# Patient Record
Sex: Male | Born: 1950 | Race: White | Hispanic: No | Marital: Married | Smoking: Never smoker
Health system: Southern US, Community
[De-identification: ages and names within clinical notes are randomized; demographics above are authoritative.]

## PROBLEM LIST (undated history)

## (undated) DIAGNOSIS — R51 Headache: Secondary | ICD-10-CM

## (undated) DIAGNOSIS — J45909 Unspecified asthma, uncomplicated: Secondary | ICD-10-CM

## (undated) DIAGNOSIS — F329 Major depressive disorder, single episode, unspecified: Secondary | ICD-10-CM

## (undated) DIAGNOSIS — S83207A Unspecified tear of unspecified meniscus, current injury, left knee, initial encounter: Secondary | ICD-10-CM

## (undated) DIAGNOSIS — E7800 Pure hypercholesterolemia, unspecified: Secondary | ICD-10-CM

## (undated) DIAGNOSIS — M1710 Unilateral primary osteoarthritis, unspecified knee: Secondary | ICD-10-CM

## (undated) DIAGNOSIS — R519 Headache, unspecified: Secondary | ICD-10-CM

## (undated) DIAGNOSIS — Z8639 Personal history of other endocrine, nutritional and metabolic disease: Secondary | ICD-10-CM

## (undated) DIAGNOSIS — J189 Pneumonia, unspecified organism: Secondary | ICD-10-CM

## (undated) DIAGNOSIS — F32A Depression, unspecified: Secondary | ICD-10-CM

## (undated) DIAGNOSIS — M179 Osteoarthritis of knee, unspecified: Secondary | ICD-10-CM

## (undated) DIAGNOSIS — B2790 Infectious mononucleosis, unspecified without complication: Secondary | ICD-10-CM

## (undated) DIAGNOSIS — H35033 Hypertensive retinopathy, bilateral: Principal | ICD-10-CM

## (undated) HISTORY — DX: Unspecified asthma, uncomplicated: J45.909

## (undated) HISTORY — DX: Unspecified tear of unspecified meniscus, current injury, left knee, initial encounter: S83.207A

## (undated) HISTORY — PX: COLONOSCOPY W/ BIOPSIES AND POLYPECTOMY: SHX1376

## (undated) HISTORY — DX: Pneumonia, unspecified organism: J18.9

## (undated) HISTORY — DX: Personal history of other endocrine, nutritional and metabolic disease: Z86.39

## (undated) HISTORY — DX: Hypertensive retinopathy, bilateral: H35.033

## (undated) HISTORY — PX: EYE SURGERY: SHX253

## (undated) HISTORY — PX: TONSILLECTOMY: SUR1361

## (undated) HISTORY — PX: KNEE ARTHROSCOPY: SUR90

## (undated) HISTORY — PX: WISDOM TOOTH EXTRACTION: SHX21

---

## 2012-08-07 ENCOUNTER — Encounter: Payer: Self-pay | Admitting: Family Medicine

## 2012-08-07 ENCOUNTER — Ambulatory Visit (INDEPENDENT_AMBULATORY_CARE_PROVIDER_SITE_OTHER): Payer: Managed Care, Other (non HMO) | Admitting: Family Medicine

## 2012-08-07 VITALS — BP 133/70 | HR 65 | Ht 71.0 in | Wt 203.0 lb

## 2012-08-07 DIAGNOSIS — J45909 Unspecified asthma, uncomplicated: Secondary | ICD-10-CM

## 2012-08-07 DIAGNOSIS — Z13220 Encounter for screening for lipoid disorders: Secondary | ICD-10-CM

## 2012-08-07 DIAGNOSIS — S83207A Unspecified tear of unspecified meniscus, current injury, left knee, initial encounter: Secondary | ICD-10-CM | POA: Insufficient documentation

## 2012-08-07 DIAGNOSIS — Z131 Encounter for screening for diabetes mellitus: Secondary | ICD-10-CM

## 2012-08-07 DIAGNOSIS — J189 Pneumonia, unspecified organism: Secondary | ICD-10-CM

## 2012-08-07 DIAGNOSIS — Z8639 Personal history of other endocrine, nutritional and metabolic disease: Secondary | ICD-10-CM

## 2012-08-07 HISTORY — DX: Personal history of other endocrine, nutritional and metabolic disease: Z86.39

## 2012-08-07 HISTORY — DX: Unspecified asthma, uncomplicated: J45.909

## 2012-08-07 HISTORY — DX: Pneumonia, unspecified organism: J18.9

## 2012-08-07 HISTORY — DX: Unspecified tear of unspecified meniscus, current injury, left knee, initial encounter: S83.207A

## 2012-08-07 NOTE — Progress Notes (Addendum)
CC: Fred Bond is a 62 y.o. male is here for Establish Care  Colon 2010 5 years clearance  Subjective: HPI: Pronounced "Weedman". 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 He has no acute complaints.  History of iron deficiency anemia: This was found about 2 years ago by his former provider, he's unsure of specifics regarding iron profile or hemoglobin. Just prior to his being found he had a colonoscopy which did not show any polyps or abnormalities. He takes a Flintstones multivitamin for iron no other supplementation. He denies shortness of breath, irregular heartbeat, palpitations, nor fatigue.  Is a history of 2 pneumonias one year part one of which required hospitalization, the other which he declined hospitalization due to being [place] however [place] an [place].  History of asthma: Not requiring albuterol for the past year plus.  History of left knee meniscal tear for which he is trying to postpone surgery on. It often catches, and prohibits him from running which bothers him quite a deal.  Review of Systems - General ROS: negative for - chills, fever, night sweats, weight gain or weight loss Ophthalmic ROS: negative for - decreased vision Psychological ROS: negative for - anxiety or depression ENT ROS: negative for - hearing change, nasal congestion, tinnitus or allergies Hematological and Lymphatic ROS: negative for - bleeding problems, bruising or swollen lymph nodes Breast ROS: negative Respiratory ROS: no cough, shortness of breath, or wheezing Cardiovascular ROS: no chest pain or dyspnea on exertion Gastrointestinal ROS: no abdominal pain, change in bowel habits, or black or bloody stools Genito-Urinary ROS: negative for - genital discharge, genital ulcers, incontinence or abnormal bleeding from genitals Musculoskeletal ROS: negative for - joint pain  or muscle pain other than above Neurological ROS: negative for - headaches or memory loss Dermatological ROS: negative for lumps, mole changes, rash and skin lesion changes  Past Medical History  Diagnosis Date  . Asthma 08/07/2012    Childhood   . History of iron deficiency 08/07/2012  . Recurrent pneumonia 08/07/2012  . Tear of meniscus of left knee 08/07/2012     Family History  Problem Relation Age of Onset  . Alcohol abuse      grandfather  . Heart attack      grandparents     History  Substance Use Topics  . Smoking status: Never Smoker   . Smokeless tobacco: Not on file  . Alcohol Use: No     Objective: Filed Vitals:   08/07/12 1002  BP: 133/70  Pulse: 65    General: Alert and Oriented, No Acute Distress HEENT: Pupils equal, round, reactive to light. Conjunctivae clear.  External ears unremarkable, canals clear with intact TMs with appropriate landmarks.  Middle ear appears open without effusion. Pink inferior turbinates.  Moist mucous membranes, pharynx without inflammation nor lesions.  Neck supple without palpable lymphadenopathy nor abnormal masses. Lungs: Clear to auscultation bilaterally, no wheezing/ronchi/rales.  Comfortable work of breathing. Good air movement. Cardiac: Regular rate and rhythm. Normal S1/S2.  No murmurs, rubs, nor gallops.  No carotid bruits Mental Status: No depression, anxiety, nor agitation. Skin: Warm and dry.  Assessment & Plan: Fred Bond was seen today for establish care.  Diagnoses and associated orders for this visit:  History of iron deficiency - CBC  Recurrent pneumonia  Lipid screening - Lipid panel  Screening for diabetes mellitus - COMPLETE METABOLIC PANEL WITH GFR  Asthma Comments: Childhood  History of iron deficiency: Clinically stable: We'll screen with CBC  Recurrent pneumonia in the setting of asthma: Stable asthma however will strongly consider pneumonia vaccine at his upcoming CPE Lipid screening: Due for  annual screening He'll return at his convenience for a CPE  30 minutes spent face-to-face during visit today of which at least 50% was counseling or coordinating care regarding iron deficiency anemia: Asthma: Pneumonia: Meniscal tear.     Return in about 1 week (around 08/14/2012).

## 2012-08-08 LAB — LIPID PANEL
LDL Cholesterol: 148 mg/dL — ABNORMAL HIGH (ref 0–99)
Triglycerides: 128 mg/dL (ref <150)
VLDL: 26 mg/dL (ref 0–40)

## 2012-08-08 LAB — CBC
HCT: 43.8 % (ref 39.0–52.0)
Hemoglobin: 15.1 g/dL (ref 13.0–17.0)
MCHC: 34.5 g/dL (ref 30.0–36.0)
MCV: 93 fL (ref 78.0–100.0)

## 2012-08-09 LAB — COMPLETE METABOLIC PANEL WITH GFR
ALT: 44 U/L (ref 0–53)
AST: 34 U/L (ref 0–37)
Calcium: 9.4 mg/dL (ref 8.4–10.5)
Chloride: 104 mEq/L (ref 96–112)
Creat: 0.95 mg/dL (ref 0.50–1.35)
Sodium: 140 mEq/L (ref 135–145)
Total Bilirubin: 0.6 mg/dL (ref 0.3–1.2)

## 2012-08-11 ENCOUNTER — Encounter: Payer: Self-pay | Admitting: Family Medicine

## 2012-08-11 DIAGNOSIS — E785 Hyperlipidemia, unspecified: Secondary | ICD-10-CM | POA: Insufficient documentation

## 2012-08-20 ENCOUNTER — Encounter: Payer: Self-pay | Admitting: *Deleted

## 2012-08-25 ENCOUNTER — Ambulatory Visit (INDEPENDENT_AMBULATORY_CARE_PROVIDER_SITE_OTHER): Payer: Managed Care, Other (non HMO) | Admitting: Family Medicine

## 2012-08-25 ENCOUNTER — Encounter: Payer: Self-pay | Admitting: Family Medicine

## 2012-08-25 VITALS — BP 124/72 | HR 74 | Ht 71.0 in | Wt 197.0 lb

## 2012-08-25 DIAGNOSIS — Z125 Encounter for screening for malignant neoplasm of prostate: Secondary | ICD-10-CM

## 2012-08-25 DIAGNOSIS — R6882 Decreased libido: Secondary | ICD-10-CM

## 2012-08-25 DIAGNOSIS — Z0000 Encounter for general adult medical examination without abnormal findings: Secondary | ICD-10-CM

## 2012-08-25 MED ORDER — ZOSTER VACCINE LIVE 19400 UNT/0.65ML ~~LOC~~ SOLR: 0.6500 mL | Freq: Once | SUBCUTANEOUS | Status: DC

## 2012-08-25 NOTE — Progress Notes (Signed)
CC: Fred Bond is a 61 y.o. male is here for Annual Exam  Colonoscopy:2010 repeat due 2015 Prostate: Discussed screening risks/beneifts with patient on 08/25/2012. He would like a PSA done   Influenza Vaccine: Up-to-date for this season Pneumovax: Not indicated Td/Tdap: Received sometime in 2005, up-to-date Zoster: consider filling Rx given today at age 76  Subjective: HPI:  Over the past 2 weeks have you been bothered by: - Little interest or pleasure in doing things: No - Feeling down depressed or hopeless: No  Patient's only acute complaint is decreased libido. Has been present for months. Is in a loving relationship with his wife, still physically attracted to her. Denies trouble initiating or maintaining an erection. Denies fatigue or depression.   Review of Systems - General ROS: negative for - chills, fever, night sweats, weight gain or weight loss Ophthalmic ROS: negative for - decreased vision Psychological ROS: negative for - anxiety or depression ENT ROS: negative for - hearing change, nasal congestion, tinnitus or allergies Hematological and Lymphatic ROS: negative for - bleeding problems, bruising or swollen lymph nodes Breast ROS: negative Respiratory ROS: no cough, shortness of breath, or wheezing Cardiovascular ROS: no chest pain or dyspnea on exertion Gastrointestinal ROS: no abdominal pain, change in bowel habits, or black or bloody stools Genito-Urinary ROS: negative for - genital discharge, genital ulcers, incontinence or abnormal bleeding from genitals Musculoskeletal ROS: negative for - joint pain or muscle pain Neurological ROS: negative for - headaches or memory loss Dermatological ROS: negative for lumps, mole changes, rash and skin lesion changes  Past Medical History  Diagnosis Date  . Asthma 08/07/2012    Childhood   . History of iron deficiency 08/07/2012  . Recurrent pneumonia 08/07/2012  . Tear of meniscus of left knee 08/07/2012     Family  History  Problem Relation Age of Onset  . Alcohol abuse      grandfather  . Heart attack      grandparents     History  Substance Use Topics  . Smoking status: Never Smoker   . Smokeless tobacco: Not on file  . Alcohol Use: No     Objective: Filed Vitals:   08/25/12 1335  BP: 124/72  Pulse: 74   General: No Acute Distress HEENT: Atraumatic, normocephalic, conjunctivae normal without scleral icterus.  No nasal discharge, hearing grossly intact, TMs with good landmarks bilaterally with no middle ear abnormalities, posterior pharynx clear without oral lesions. Neck: Supple, trachea midline, no cervical nor supraclavicular adenopathy. Pulmonary: Clear to auscultation bilaterally without wheezing, rhonchi, nor rales. Cardiac: Regular rate and rhythm.  No murmurs, rubs, nor gallops. No peripheral edema.  2+ peripheral pulses bilaterally. Abdomen: Bowel sounds normal.  No masses.  Non-tender without rebound.  Negative Murphy's sign. GU: Declined by patient MSK: Grossly intact, no signs of weakness.  Full strength throughout upper and lower extremities.  Full ROM in upper and lower extremities.  No midline spinal tenderness. Neuro: Gait unremarkable, CN II-XII grossly intact.  C5-C6 Reflex 2/4 Bilaterally, L4 Reflex 2/4 Bilaterally.  Cerebellar function intact. Skin: No rashes. Psych: Alert and oriented to person/place/time.  Thought process normal. No anxiety/depression.  Assessment & Plan: Fred Bond was seen today for annual exam.  Diagnoses and associated orders for this visit:  Annual physical exam  Prostate cancer screening - PSA  Libido, decreased - Testosterone, free, total  Other Orders - zoster vaccine live, PF, (ZOSTAVAX) 84132 UNT/0.65ML injection; Inject 19,400 Units into the skin once.    Health maintenance topics  and age-appropriate cancer prevention methods were updated about history of present illness. We'll screen for symptomatic low testosterone and  discussed repeating for confirmation if low. He is up-to-date on his lab work, we discussed healthy diet and lifestyle interventions to promote general health and well-being and to help lower his LDL cholesterol, he would like to avoid cholesterol medication if possible. Asked him to return in 3 months for a cholesterol panel  Return if symptoms worsen or fail to improve.

## 2012-08-27 LAB — TESTOSTERONE, FREE, TOTAL, SHBG
Sex Hormone Binding: 110 nmol/L — ABNORMAL HIGH (ref 13–71)
Testosterone-% Free: 0.9 % — ABNORMAL LOW (ref 1.6–2.9)
Testosterone: 888.14 ng/dL (ref 300–890)

## 2012-09-22 ENCOUNTER — Encounter: Payer: Self-pay | Admitting: Family Medicine

## 2012-09-22 DIAGNOSIS — Z8639 Personal history of other endocrine, nutritional and metabolic disease: Secondary | ICD-10-CM

## 2012-09-26 ENCOUNTER — Encounter: Payer: Self-pay | Admitting: *Deleted

## 2012-09-26 LAB — IRON: .: 84

## 2012-09-26 LAB — UIBC: .: 395

## 2012-09-26 LAB — IRON AND TIBC: .: 395

## 2013-08-23 ENCOUNTER — Emergency Department (INDEPENDENT_AMBULATORY_CARE_PROVIDER_SITE_OTHER): Payer: Managed Care, Other (non HMO)

## 2013-08-23 ENCOUNTER — Emergency Department
Admission: EM | Admit: 2013-08-23 | Discharge: 2013-08-23 | Disposition: A | Discharge status: Home / Self Care | Payer: Managed Care, Other (non HMO) | Source: Home / Self Care | Attending: Family Medicine | Admitting: Family Medicine

## 2013-08-23 ENCOUNTER — Encounter: Payer: Self-pay | Admitting: Emergency Medicine

## 2013-08-23 DIAGNOSIS — IMO0001 Reserved for inherently not codable concepts without codable children: Secondary | ICD-10-CM

## 2013-08-23 DIAGNOSIS — M20019 Mallet finger of unspecified finger(s): Secondary | ICD-10-CM

## 2013-08-23 DIAGNOSIS — M19049 Primary osteoarthritis, unspecified hand: Secondary | ICD-10-CM

## 2013-08-23 NOTE — ED Notes (Signed)
Zeek jammed his 3 rd finger on his left hand today. The pain is an aching/throbbing pain that is a 4/10.

## 2013-08-23 NOTE — ED Provider Notes (Signed)
CSN: [id number]     Arrival date & time 08/23/13  1210 History   First MD Initiated Contact with Patient 08/23/13 1213     Chief Complaint  Patient presents with  . Finger Injury    3 rd digit, today    HPI  L middle finger pain x 2 days Pt accidentally jammed finger into bed yesterday  Has had mild pain and inability to extend DIP joint of middle finger since this point.  No distal numbness or paresthesias.  No swelling.  Minimal redness.   Past Medical History  Diagnosis Date  . Asthma 08/07/2012    Childhood   . History of iron deficiency 08/07/2012  . Recurrent pneumonia 08/07/2012  . Tear of meniscus of left knee 08/07/2012   History reviewed. No pertinent past surgical history. Family History  Problem Relation Age of Onset  . Alcohol abuse      grandfather  . Heart attack      grandparents   History  Substance Use Topics  . Smoking status: Never Smoker   . Smokeless tobacco: Not on file  . Alcohol Use: No    Review of Systems  All other systems reviewed and are negative.    Allergies  Review of patient's allergies indicates no known allergies.  Home Medications   Current Outpatient Rx  Name  Route  Sig  Dispense  Refill  . aspirin 325 MG tablet   Oral   Take 325 mg by mouth daily.         . calcium carbonate (OS-CAL) 600 MG TABS   Oral   Take 600 mg by mouth daily.         Marland Kitchen L-Lysine 500 MG TABS   Oral   Take by mouth.         . Multiple Vitamins-Minerals (CENTRUM SILVER ULTRA MENS) TABS   Oral   Take by mouth.         . niacin 500 MG tablet   Oral   Take 500 mg by mouth daily with breakfast.         . Omega-3 Fatty Acids (FISH OIL) 1000 MG CAPS   Oral   Take by mouth.         . zoster vaccine live, PF, (ZOSTAVAX) 40981 UNT/0.65ML injection   Subcutaneous   Inject 19,400 Units into the skin once.   1 each   0    BP 114/74  Pulse 69  Temp(Src) 97.8 F (36.6 C) (Oral)  Ht 5\' 11"  (1.803 m)  Wt 201 lb (91.173 kg)  BMI  28.05 kg/m2  SpO2 95% Physical Exam  Constitutional: He is oriented to person, place, and time. He appears well-developed and well-nourished.  HENT:  Head: Normocephalic and atraumatic.  Eyes: Conjunctivae are normal. Pupils are equal, round, and reactive to light.  Neck: Normal range of motion. Neck supple.  Cardiovascular: Normal rate and regular rhythm.   Pulmonary/Chest: Effort normal and breath sounds normal.  Abdominal: Soft.  Musculoskeletal: Normal range of motion.       Hands: Neurological: He is alert and oriented to person, place, and time.  Skin: Skin is warm.  Affected finger neurovascularly intact.   ED Course  Procedures (including critical care time) Labs Review Labs Reviewed - No data to display Imaging Review Dg Hand Complete Left  08/23/2013   CLINICAL DATA:  Left hand injury. Jammed middle finger. Pain and swelling.  EXAM: LEFT HAND - COMPLETE 3+ VIEW  COMPARISON:  None.  FINDINGS: There is no evidence of fracture or dislocation. Severe osteoarthritis is seen involving the base of the thumb. No other significant bone abnormality identified. Soft tissues are unremarkable.  IMPRESSION: No acute findings.  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   Electronically Signed   By: Myles RosenthalJohn  Stahl M.D.   On: 08/23/2013 13:37    EKG Interpretation    Date/Time:    Ventricular Rate:    PR Interval:    QRS Duration:   QT Interval:    QTC Calculation:   R Axis:     Text Interpretation:              MDM   1. Mallet deformity of third finger of left hand    Will place in extensor splint.  xrays negative for acute fracture Will follow up with sports medicine within the next week.  Discussed general and MSK red flags  Follow up as needed.        Doree AlbeeSteven Newton, MD 08/23/13 1341

## 2013-08-24 ENCOUNTER — Ambulatory Visit (INDEPENDENT_AMBULATORY_CARE_PROVIDER_SITE_OTHER): Payer: Managed Care, Other (non HMO) | Admitting: Sports Medicine

## 2013-08-24 ENCOUNTER — Encounter: Payer: Self-pay | Admitting: Sports Medicine

## 2013-08-24 VITALS — BP 109/63 | HR 76 | Wt 199.0 lb

## 2013-08-24 DIAGNOSIS — M20019 Mallet finger of unspecified finger(s): Secondary | ICD-10-CM

## 2013-08-24 DIAGNOSIS — IMO0001 Reserved for inherently not codable concepts without codable children: Secondary | ICD-10-CM | POA: Insufficient documentation

## 2013-08-24 NOTE — Progress Notes (Addendum)
   Subjective:    I'm seeing this patient as a consultation for:  Dr. Alvester MorinNewton  CC: Finger injury  HPI: This is a very pleasant 63 year old male, yesterday he was trying to make his bed, when he jammed his left third distal interphalangeal joint. He had immediate pain and inability to extend at the PIP. He was placed in an extension splint and referred to me for further evaluation and definitive treatment. Symptoms are moderate, persistent.  Past medical history, Surgical history, Family history not pertinant except as noted below, Social history, Allergies, and medications have been entered into the medical record, reviewed, and no changes needed.   Review of Systems: No headache, visual changes, nausea, vomiting, diarrhea, constipation, dizziness, abdominal pain, skin rash, fevers, chills, night sweats, weight loss, swollen lymph nodes, body aches, joint swelling, muscle aches, chest pain, shortness of breath, mood changes, visual or auditory hallucinations.   Objective:   General: Well Developed, well nourished, and in no acute distress.  Neuro/Psych: Alert and oriented x3, extra-ocular muscles intact, able to move all 4 extremities, sensation grossly intact. Skin: Warm and dry, no rashes noted.  Respiratory: Not using accessory muscles, speaking in full sentences, trachea midline.  Cardiovascular: Pulses palpable, no extremity edema. Abdomen: Does not appear distended. Left hand: No swelling, no bruising, distal interphalangeal joint is held in a flexed position with 0/5 strength to extension.  X-rays were reviewed and are negative for any type of fracture.  The stack splint was placed keeping the DIP joint in extension.  Impression and Recommendations:   This case required medical decision making of moderate complexity.

## 2013-08-24 NOTE — Assessment & Plan Note (Signed)
This represents a tendinous injury with normal x-rays. Stack splint placed, I do anticipate a total weeks of immobilization. I would like to see him back in about 4 weeks. Anatomy and natural history of this injury was discussed.

## 2013-08-27 ENCOUNTER — Institutional Professional Consult (permissible substitution): Payer: Managed Care, Other (non HMO) | Admitting: Sports Medicine

## 2013-09-07 ENCOUNTER — Ambulatory Visit (INDEPENDENT_AMBULATORY_CARE_PROVIDER_SITE_OTHER): Payer: PRIVATE HEALTH INSURANCE | Admitting: Sports Medicine

## 2013-09-07 ENCOUNTER — Encounter: Payer: Self-pay | Admitting: Sports Medicine

## 2013-09-07 VITALS — BP 129/69 | HR 79 | Ht 71.0 in | Wt 199.0 lb

## 2013-09-07 DIAGNOSIS — M20019 Mallet finger of unspecified finger(s): Secondary | ICD-10-CM

## 2013-09-07 DIAGNOSIS — IMO0001 Reserved for inherently not codable concepts without codable children: Secondary | ICD-10-CM

## 2013-09-07 MED ORDER — MUPIROCIN CALCIUM 2 % NA OINT: 1.0000 "application " | TOPICAL_OINTMENT | Freq: Two times a day (BID) | NASAL | Status: DC

## 2013-09-07 NOTE — Progress Notes (Signed)
  Subjective:    CC: Followup  HPI: [name] returns for me to look at his left third mallet finger. He was doing well until recently, he developed some swelling, pain, and a foul order. He feels as though his stack splint is now too tight. Symptoms are moderate, persistent.  Past medical history, Surgical history, Family history not pertinant except as noted below, Social history, Allergies, and medications have been entered into the medical record, reviewed, and no changes needed.   Review of Systems: No fevers, chills, night sweats, weight loss, chest pain, or shortness of breath.   Objective:    General: Well Developed, well nourished, and in no acute distress.  Neuro: Alert and oriented x3, extra-ocular muscles intact, sensation grossly intact.  HEENT: Normocephalic, atraumatic, pupils equal round reactive to light, neck supple, no masses, no lymphadenopathy, thyroid nonpalpable.  Skin: Warm and dry, no rashes. Cardiac: Regular rate and rhythm, no murmurs rubs or gallops, no lower extremity edema.  Respiratory: Clear to auscultation bilaterally. Not using accessory muscles, speaking in full sentences. Left hand: Splint is removed taking care to keep the distal interphalangeal joint and extension. He does have some skin breakdown and some foul order but no overt cellulitis.  Volar extension splint was placed.  Impression and Recommendations:

## 2013-09-07 NOTE — Assessment & Plan Note (Addendum)
There is a mild superficial skin infection as well as some skin breakdown from the stack splint being too tight. I have placed a simple volar extension splint. We are going to topical mupirocin. Return to see me in 4 weeks. I have given him a size 5.5 stack splint for use once his skin has healed.

## 2013-09-08 ENCOUNTER — Telehealth: Payer: Self-pay | Admitting: *Deleted

## 2013-09-08 ENCOUNTER — Telehealth: Payer: Self-pay

## 2013-09-08 MED ORDER — MUPIROCIN 2 % EX OINT: TOPICAL_OINTMENT | CUTANEOUS | Status: DC

## 2013-09-08 MED ORDER — OSELTAMIVIR PHOSPHATE 75 MG PO CAPS: ORAL_CAPSULE | ORAL | Status: DC

## 2013-09-08 NOTE — Telephone Encounter (Signed)
Patient called concerned about a Rx Bactroban that the pharmacy has on hold. I called [facility] and they request a new Rx with the correct instructions on it. The directions state to put in the nostrils but however it is for the patient  finger. Please send to [facility]. Fred Bond,CMA

## 2013-09-08 NOTE — Telephone Encounter (Signed)
Patient has been notified. Rhonda Cunningham,CMA  

## 2013-09-08 NOTE — Telephone Encounter (Signed)
Pt called and says his step son was dx with flu. Pt would like to get a rx for tamiflu. rx sent and pt notified

## 2013-09-08 NOTE — Telephone Encounter (Signed)
Yes should say finger!  Will resend.

## 2013-09-21 ENCOUNTER — Ambulatory Visit: Payer: Managed Care, Other (non HMO) | Admitting: Sports Medicine

## 2013-10-05 ENCOUNTER — Ambulatory Visit (INDEPENDENT_AMBULATORY_CARE_PROVIDER_SITE_OTHER): Payer: PRIVATE HEALTH INSURANCE | Admitting: Sports Medicine

## 2013-10-05 ENCOUNTER — Encounter: Payer: Self-pay | Admitting: Sports Medicine

## 2013-10-05 VITALS — BP 134/82 | HR 70 | Ht 71.0 in | Wt 200.0 lb

## 2013-10-05 DIAGNOSIS — M20019 Mallet finger of unspecified finger(s): Secondary | ICD-10-CM

## 2013-10-05 DIAGNOSIS — IMO0001 Reserved for inherently not codable concepts without codable children: Secondary | ICD-10-CM

## 2013-10-05 NOTE — Progress Notes (Signed)
  Subjective:    CC: Recheck mallet finger  HPI: This is a pleasant 63 year old male, he returns after 6 weeks of immobilization of the distal interphalangeal joint of his third left digit. He had a extensor tendon avulsion without bony injury. He is a fantastic job keeping the digit extending, and has not allowed to drop and a flexion since beginning. He really has no pain at this point.  Past medical history, Surgical history, Family history not pertinant except as noted below, Social history, Allergies, and medications have been entered into the medical record, reviewed, and no changes needed.   Review of Systems: No fevers, chills, night sweats, weight loss, chest pain, or shortness of breath.   Objective:    General: Well Developed, well nourished, and in no acute distress.  Neuro: Alert and oriented x3, extra-ocular muscles intact, sensation grossly intact.  HEENT: Normocephalic, atraumatic, pupils equal round reactive to light, neck supple, no masses, no lymphadenopathy, thyroid nonpalpable.  Skin: Warm and dry, no rashes. Cardiac: Regular rate and rhythm, no murmurs rubs or gallops, no lower extremity edema.  Respiratory: Clear to auscultation bilaterally. Not using accessory muscles, speaking in full sentences. Left hand: Extension splint is removed, he is able to maintain full active extension against resistance, he only lacks approximately 1-2 of terminal extension. Flexion at the distal and proximal interphalangeal joints are of course limited at this point due to prolonged immobilization.  Impression and Recommendations:

## 2013-10-05 NOTE — Assessment & Plan Note (Signed)
Doing extremely well with 6 weeks of extension immobilization. He does lack approximately 1-2 of extension however he does have active strength to extension at the distal interphalangeal joint. Work on regaining range of motion, return to see me in one month.

## 2013-10-20 ENCOUNTER — Encounter: Payer: Self-pay | Admitting: Family Medicine

## 2013-10-20 ENCOUNTER — Ambulatory Visit (INDEPENDENT_AMBULATORY_CARE_PROVIDER_SITE_OTHER): Payer: PRIVATE HEALTH INSURANCE | Admitting: Family Medicine

## 2013-10-20 VITALS — BP 130/67 | HR 68 | Wt 200.0 lb

## 2013-10-20 DIAGNOSIS — S060X0A Concussion without loss of consciousness, initial encounter: Secondary | ICD-10-CM

## 2013-10-20 NOTE — Progress Notes (Signed)
CC: Fred Bond is a 63 y.o. male is here for Head Injury   Subjective: HPI:  Complains of headache and mental sluggishness but has been present for the last 4 hours came on acutely after he fell off his chair backwards into a wall. Symptoms are mild in severity nothing particularly makes it better or worse no interventions as of yet. They've not been progressing since onset. Have not been accompanied by any other symptoms specifically disorientation, dizziness, nausea, emotional lability, decreased appetite, double vision, nor motor or sensory disturbances other than that described above. He never had this before was in his regular state of health earlier this morning. He is confident that he did not lose consciousness.   Review Of Systems Outlined In HPI  Past Medical History  Diagnosis Date  . Asthma 08/07/2012    Childhood   . History of iron deficiency 08/07/2012  . Recurrent pneumonia 08/07/2012  . Tear of meniscus of left knee 08/07/2012    No past surgical history on file. Family History  Problem Relation Age of Onset  . Alcohol abuse      grandfather  . Heart attack      grandparents    History   Social History  . Marital Status: Married    Spouse Name: N/A    Number of Children: N/A  . Years of Education: N/A   Occupational History  . Not on file.   Social History Main Topics  . Smoking status: Never Smoker   . Smokeless tobacco: Not on file  . Alcohol Use: No  . Drug Use: No  . Sexual Activity: Yes   Other Topics Concern  . Not on file   Social History Narrative  . No narrative on file     Objective: BP 130/67  Pulse 68  Wt 200 lb (90.719 kg)  General: Alert and Oriented, No Acute Distress HEENT: Pupils equal, round, reactive to light. Conjunctivae clear.  External ears unremarkable, canals clear with intact TMs with appropriate landmarks.  Middle ear appears open without effusion. Pink inferior turbinates.  Moist mucous membranes, pharynx without  inflammation nor lesions.  Neck supple without palpable lymphadenopathy nor abnormal masses. Posterior scalp unremarkable Neuro: CN II-XII grossly intact, full strength/rom of all four extremities, C5/L4/S1 DTRs 2/4 bilaterally, gait normal, rapid alternating movements normal, heel-shin test normal, Rhomberg normal. Lungs: Comfortable work of breathing Cardiac: Regular rate and rhythm. Extremities: No peripheral edema.  Strong peripheral pulses.  Mental Status: No depression, anxiety, nor agitation. Skin: Warm and dry.  SCAT 3 testing: Orientation 5 out of 5 Immediate memory of 15 out of 15 Concentration 5 out of 5  delayed recall 5 out of 5  No difficulty standing on nondominant foot single-leg stance with eyes closed  Assessment & Plan: Fred Bond was seen today for head injury.  Diagnoses and associated orders for this visit:  Concussion with no loss of consciousness     discussed with patient that subjective symptoms are convincing for a mild concussion however objective symptoms are reassuring that this should resolve with relative brain rest for the next 2-3 days. Signs and symptoms requring emergent/urgent reevaluation were discussed with the patient.   Return if symptoms worsen or fail to improve.

## 2013-11-02 ENCOUNTER — Ambulatory Visit (INDEPENDENT_AMBULATORY_CARE_PROVIDER_SITE_OTHER): Payer: PRIVATE HEALTH INSURANCE | Admitting: Sports Medicine

## 2013-11-02 ENCOUNTER — Encounter: Payer: Self-pay | Admitting: Sports Medicine

## 2013-11-02 VITALS — BP 138/80 | HR 73 | Ht 71.0 in | Wt 201.0 lb

## 2013-11-02 DIAGNOSIS — IMO0001 Reserved for inherently not codable concepts without codable children: Secondary | ICD-10-CM

## 2013-11-02 DIAGNOSIS — M20019 Mallet finger of unspecified finger(s): Secondary | ICD-10-CM

## 2013-11-02 NOTE — Progress Notes (Signed)
  Subjective:    CC: Recheck mallet finger  HPI: Initially Fred Bond did very well with 6 weeks of extension immobilization, at the end of the 6 weeks he had excellent strength, with only approximately 1-2 extension lag. Unfortunately he has lost extension of the left third finger, and it is now swollen, minimally painful, and he has approximately 45 extension lag, and weakness to extension of the distal interphalangeal joint.  Past medical history, Surgical history, Family history not pertinant except as noted below, Social history, Allergies, and medications have been entered into the medical record, reviewed, and no changes needed.   Review of Systems: No fevers, chills, night sweats, weight loss, chest pain, or shortness of breath.   Objective:    General: Well Developed, well nourished, and in no acute distress.  Neuro: Alert and oriented x3, extra-ocular muscles intact, sensation grossly intact.  HEENT: Normocephalic, atraumatic, pupils equal round reactive to light, neck supple, no masses, no lymphadenopathy, thyroid nonpalpable.  Skin: Warm and dry, no rashes. Cardiac: Regular rate and rhythm, no murmurs rubs or gallops, no lower extremity edema.  Respiratory: Clear to auscultation bilaterally. Not using accessory muscles, speaking in full sentences. Left hand: There is mild erythema, swelling, and 0/5 extension at the third distal interphalangeal joint. Collateral ligaments are stable. Unofficial ultrasound did show the extensor tendon retracted approximately 3 mm from its expected insertion at the distal phalanx.  Extension splint was reapplied.  Impression and Recommendations:

## 2013-11-02 NOTE — Assessment & Plan Note (Signed)
Unfortunately has had a recurrence of flexion deformity after 6 weeks of extension and mobilization. At this point we are going to proceed with referral to hand surgery. Extension splint placed.

## 2013-11-05 ENCOUNTER — Ambulatory Visit (INDEPENDENT_AMBULATORY_CARE_PROVIDER_SITE_OTHER): Payer: PRIVATE HEALTH INSURANCE | Admitting: Family Medicine

## 2013-11-05 ENCOUNTER — Encounter: Payer: Self-pay | Admitting: Family Medicine

## 2013-11-05 VITALS — BP 133/78 | HR 61 | Wt 201.0 lb

## 2013-11-05 DIAGNOSIS — B9689 Other specified bacterial agents as the cause of diseases classified elsewhere: Secondary | ICD-10-CM

## 2013-11-05 DIAGNOSIS — J329 Chronic sinusitis, unspecified: Secondary | ICD-10-CM

## 2013-11-05 DIAGNOSIS — A499 Bacterial infection, unspecified: Secondary | ICD-10-CM

## 2013-11-05 MED ORDER — PREDNISONE 20 MG PO TABS
ORAL_TABLET | ORAL | Status: AC
Start: 2013-11-05 — End: 2013-11-10

## 2013-11-05 MED ORDER — AMOXICILLIN-POT CLAVULANATE 500-125 MG PO TABS: ORAL_TABLET | ORAL | Status: AC

## 2013-11-05 NOTE — Progress Notes (Signed)
CC: Fred Bond is a 63 y.o. male is here for Cough, Generalized Body Aches and Fever   Subjective: HPI:  Complains of nasal congestion with facial pressure beneath both eyes it hasn't present for the last week. Worsening a daily basis originally mild in severity now moderate. Accompanied by fever, chills, productive cough that began 2 days ago. Symptoms are slightly improved with Alka-Seltzer cold products nothing else makes better or worse. Accompanied by body aches starting last night. Denies wheezing, chest pain, shortness of breath, motor sensory disturbances, rashes, sore throat, hearing loss nor wheezing   Review Of Systems Outlined In HPI  Past Medical History  Diagnosis Date  . Asthma 08/07/2012    Childhood   . History of iron deficiency 08/07/2012  . Recurrent pneumonia 08/07/2012  . Tear of meniscus of left knee 08/07/2012    No past surgical history on file. Family History  Problem Relation Age of Onset  . Alcohol abuse      grandfather  . Heart attack      grandparents    History   Social History  . Marital Status: Married    Spouse Name: N/A    Number of Children: N/A  . Years of Education: N/A   Occupational History  . Not on file.   Social History Main Topics  . Smoking status: Never Smoker   . Smokeless tobacco: Not on file  . Alcohol Use: No  . Drug Use: No  . Sexual Activity: Yes   Other Topics Concern  . Not on file   Social History Narrative  . No narrative on file     Objective: BP 133/78  Pulse 61  Wt 201 lb (91.173 kg)  General: Alert and Oriented, No Acute Distress HEENT: Pupils equal, round, reactive to light. Conjunctivae clear.  External ears unremarkable, canals clear with intact TMs with appropriate landmarks.  Middle ear appears open without effusion. Boggy erythematous inferior turbinates with moderate mucoid discharge.  Moist mucous membranes, pharynx without inflammation nor lesions however moderate cobblestoning.  Neck supple  without palpable lymphadenopathy nor abnormal masses. Lungs: Clear to auscultation bilaterally, no wheezing/ronchi/rales.  Comfortable work of breathing. Good air movement. Cardiac: Regular rate and rhythm. Normal S1/S2.  No murmurs, rubs, nor gallops.   Mental Status: No depression, anxiety, nor agitation. Skin: Warm and dry.  Assessment & Plan: Fred Bond was seen today for cough, generalized body aches and fever.  Diagnoses and associated orders for this visit:  Bacterial sinusitis - amoxicillin-clavulanate (AUGMENTIN) 500-125 MG per tablet; Take one by mouth every 8 hours for ten total days. - predniSONE (DELTASONE) 20 MG tablet; Three tabs at once daily for five days.    Bacterial sinusitis: Start Augmentin continue Alka-Seltzer products. Patient inquires whether or not he should start prednisone I've advised him that he does not need to start this now instead Augmentin should provide significant improvement by Saturday however if no improvement by the weekend could consider starting prednisone  Return if symptoms worsen or fail to improve.

## 2014-01-06 ENCOUNTER — Ambulatory Visit: Payer: PRIVATE HEALTH INSURANCE | Admitting: Family Medicine

## 2014-02-18 ENCOUNTER — Encounter: Payer: Self-pay | Admitting: Family Medicine

## 2014-02-18 DIAGNOSIS — D692 Other nonthrombocytopenic purpura: Secondary | ICD-10-CM | POA: Insufficient documentation

## 2014-04-06 ENCOUNTER — Encounter: Payer: Self-pay | Admitting: Family Medicine

## 2014-04-06 ENCOUNTER — Ambulatory Visit (INDEPENDENT_AMBULATORY_CARE_PROVIDER_SITE_OTHER): Payer: PRIVATE HEALTH INSURANCE | Admitting: Family Medicine

## 2014-04-06 VITALS — BP 125/81 | HR 80 | Temp 98.2°F | Wt 201.0 lb

## 2014-04-06 DIAGNOSIS — L02419 Cutaneous abscess of limb, unspecified: Secondary | ICD-10-CM

## 2014-04-06 DIAGNOSIS — L03116 Cellulitis of left lower limb: Secondary | ICD-10-CM

## 2014-04-06 DIAGNOSIS — L03119 Cellulitis of unspecified part of limb: Secondary | ICD-10-CM

## 2014-04-06 MED ORDER — CLINDAMYCIN HCL 300 MG PO CAPS: 300.0000 mg | ORAL_CAPSULE | Freq: Three times a day (TID) | ORAL | Status: DC

## 2014-04-06 NOTE — Progress Notes (Signed)
CC: Fred Bond is a 63 y.o. male is here for infected cut on leg   Subjective: HPI:  Complains of pain swelling redness and warmth localized to the left shin that has been present for the past 8 days ever since he ran into a metal ramp. He was seen later that day and received a tetanus booster and started on Keflex. Swelling was amplified 2 days after the accident and he went to emergency room to have an ultrasound which he tells me ruled out a DVT. Symptoms have not been getting worse since that encounter but they are persistent. He started to notice a pink discharge from the wound earlier today despite changing sterile gauze daily and cleaning it on a daily basis in the shower. Pain is present at rest but not influenced by weightbearing. Pain is also worse with touching. He denies fevers, chills, shortness of breath, nausea, nor motor or sensory disturbances. Denies rapid heartbeat or chest discomfort  Review Of Systems Outlined In HPI  Past Medical History  Diagnosis Date  . Asthma 08/07/2012    Childhood   . History of iron deficiency 08/07/2012  . Recurrent pneumonia 08/07/2012  . Tear of meniscus of left knee 08/07/2012    No past surgical history on file. Family History  Problem Relation Age of Onset  . Alcohol abuse      grandfather  . Heart attack      grandparents    History   Social History  . Marital Status: Married    Spouse Name: N/A    Number of Children: N/A  . Years of Education: N/A   Occupational History  . Not on file.   Social History Main Topics  . Smoking status: Never Smoker   . Smokeless tobacco: Not on file  . Alcohol Use: No  . Drug Use: No  . Sexual Activity: Yes   Other Topics Concern  . Not on file   Social History Narrative  . No narrative on file     Objective: BP 125/81  Pulse 80  Temp(Src) 98.2 F (36.8 C) (Oral)  Wt 201 lb (91.173 kg)  General: Alert and Oriented, No Acute Distress HEENT: Pupils equal, round, reactive to  light. Conjunctivae clear.  Moist mucous membranes pharynx unremarkable Lungs: Clear and comfortable work of breathing Cardiac: Regular rate and rhythm.  Extremities: Moderate nonpitting edema from the mid shin distally into the foot patches of ecchymosis on the posterior calf forefoot and heel.  1/2 cm wide by 4 cm length skin tear left anterior shin with moderate surrounding erythema no discharge or fluctuance today..  Strong peripheral pulses.  Mental Status: No depression, anxiety, nor agitation. Skin: Warm and dry.  Assessment & Plan: [place] was seen today for infected cut on leg.  Diagnoses and associated orders for this visit:  Cellulitis of leg, left - clindamycin (CLEOCIN) 300 MG capsule; Take 1 capsule (300 mg total) by mouth 3 (three) times daily.    Cellulitis of the leg: His wound was covered with iodoform and sterile gauze and I've encouraged him to do this on a daily basis for the next week, he was provided with supplies to last him a week. I've also recommended he switch to clindamycin instead of Keflex. Signs and symptoms that would be suggestive of a DVT were discussed with him and if he experiences any of this we would order a stat repeat ultrasound. Time was taken to answer all questions regarding natural progression in healing of this  wound.  25 minutes spent face-to-face during visit today of which at least 50% was counseling or coordinating care regarding: 1. Cellulitis of leg, left         Return if symptoms worsen or fail to improve.

## 2015-04-01 ENCOUNTER — Encounter (HOSPITAL_BASED_OUTPATIENT_CLINIC_OR_DEPARTMENT_OTHER): Payer: Self-pay

## 2015-04-01 ENCOUNTER — Emergency Department (HOSPITAL_BASED_OUTPATIENT_CLINIC_OR_DEPARTMENT_OTHER)
Admission: EM | Admit: 2015-04-01 | Discharge: 2015-04-02 | Disposition: A | Discharge status: Home / Self Care | Payer: PRIVATE HEALTH INSURANCE | Attending: Emergency Medicine | Admitting: Emergency Medicine

## 2015-04-01 ENCOUNTER — Emergency Department (HOSPITAL_BASED_OUTPATIENT_CLINIC_OR_DEPARTMENT_OTHER): Payer: PRIVATE HEALTH INSURANCE

## 2015-04-01 DIAGNOSIS — Z8701 Personal history of pneumonia (recurrent): Secondary | ICD-10-CM | POA: Diagnosis not present

## 2015-04-01 DIAGNOSIS — M79642 Pain in left hand: Secondary | ICD-10-CM | POA: Diagnosis present

## 2015-04-01 DIAGNOSIS — J45909 Unspecified asthma, uncomplicated: Secondary | ICD-10-CM | POA: Diagnosis not present

## 2015-04-01 DIAGNOSIS — Z87828 Personal history of other (healed) physical injury and trauma: Secondary | ICD-10-CM | POA: Insufficient documentation

## 2015-04-01 DIAGNOSIS — Z862 Personal history of diseases of the blood and blood-forming organs and certain disorders involving the immune mechanism: Secondary | ICD-10-CM | POA: Insufficient documentation

## 2015-04-01 DIAGNOSIS — Z79899 Other long term (current) drug therapy: Secondary | ICD-10-CM | POA: Insufficient documentation

## 2015-04-01 DIAGNOSIS — L03114 Cellulitis of left upper limb: Secondary | ICD-10-CM | POA: Diagnosis not present

## 2015-04-01 LAB — CBC WITH DIFFERENTIAL/PLATELET
Basophils Absolute: 0 10*3/uL (ref 0.0–0.1)
Basophils Relative: 0 % (ref 0–1)
EOS ABS: 0.2 10*3/uL (ref 0.0–0.7)
Eosinophils Relative: 2 % (ref 0–5)
HEMATOCRIT: 39.7 % (ref 39.0–52.0)
HEMOGLOBIN: 13.8 g/dL (ref 13.0–17.0)
LYMPHS ABS: 1.4 10*3/uL (ref 0.7–4.0)
Lymphocytes Relative: 13 % (ref 12–46)
MCH: 33.1 pg (ref 26.0–34.0)
MCHC: 34.8 g/dL (ref 30.0–36.0)
MCV: 95.2 fL (ref 78.0–100.0)
MONO ABS: 1.4 10*3/uL — AB (ref 0.1–1.0)
MONOS PCT: 13 % — AB (ref 3–12)
NEUTROS PCT: 72 % (ref 43–77)
Neutro Abs: 8 10*3/uL — ABNORMAL HIGH (ref 1.7–7.7)
Platelets: 193 10*3/uL (ref 150–400)
RBC: 4.17 MIL/uL — ABNORMAL LOW (ref 4.22–5.81)
RDW: 12.9 % (ref 11.5–15.5)
WBC: 11 10*3/uL — ABNORMAL HIGH (ref 4.0–10.5)

## 2015-04-01 LAB — BASIC METABOLIC PANEL
Anion gap: 9 (ref 5–15)
BUN: 12 mg/dL (ref 6–20)
CALCIUM: 9.1 mg/dL (ref 8.9–10.3)
CHLORIDE: 104 mmol/L (ref 101–111)
CO2: 27 mmol/L (ref 22–32)
CREATININE: 1.02 mg/dL (ref 0.61–1.24)
GFR calc non Af Amer: >60 mL/min (ref >60)
GLUCOSE: 111 mg/dL — AB (ref 65–99)
Potassium: 3.6 mmol/L (ref 3.5–5.1)
Sodium: 140 mmol/L (ref 135–145)

## 2015-04-01 MED ORDER — CLINDAMYCIN HCL 150 MG PO CAPS
450.0000 mg | ORAL_CAPSULE | Freq: Once | ORAL | Status: AC
  Administered 2015-04-01: 450 mg via ORAL
  Filled 2015-04-01: qty 3

## 2015-04-01 NOTE — ED Notes (Signed)
Insect bite to left hand today, fever 100.3 prior to ibuprofen-benadryl and ibuprofen PTA

## 2015-04-01 NOTE — ED Notes (Signed)
MD at bedside. 

## 2015-04-01 NOTE — ED Notes (Signed)
Patient transported to X-ray 

## 2015-04-01 NOTE — ED Provider Notes (Signed)
CSN: [id number]     Arrival date & time 04/01/15  2251 History  This chart was scribe for Fred Loveless, MD by Angelene Giovanni, ED Scribe. The patient was seen in room MH01/MH01 and the patient's care was started at 11:04 PM.     Chief Complaint  Patient presents with  . Hand Problem   The history is provided by the patient. No language interpreter was used.   HPI Comments: Fred Bond is a 64 y.o. male who presents to the Emergency Department with concern for a spider bite. He is concerned because he put his hand inside a pair of sneakers in the garage. Did not feel a bite. He reports associated gradually worsening 5/10 painful swelling and mild redness of his left hand around 6 hours ago. He also reports chills and a fever of 100.3 PTA but took a 800 mg Ibuprofen. He states that he took 2 Benadryl and the hand swelling has decreased but adds that the swelling is moving to his fingers. He believes the insect was a spider but is unsure because he did not feel a bite. He reports NKDA. No history of diabetes. No numbness.   Past Medical History  Diagnosis Date  . Asthma 08/07/2012    Childhood   . History of iron deficiency 08/07/2012  . Recurrent pneumonia 08/07/2012  . Tear of meniscus of left knee 08/07/2012   History reviewed. No pertinent past surgical history. Family History  Problem Relation Age of Onset  . Alcohol abuse      grandfather  . Heart attack      grandparents   Social History  Substance Use Topics  . Smoking status: Never Smoker   . Smokeless tobacco: None  . Alcohol Use: No    Review of Systems  Constitutional: Positive for fever and chills.  Musculoskeletal: Positive for joint swelling and arthralgias.  Skin: Negative for color change and wound.  All other systems reviewed and are negative.     Allergies  Review of patient's allergies indicates no known allergies.  Home Medications   Prior to Admission medications   Medication Sig Start Date End Date  Taking? Authorizing Provider  AMBULATORY NON FORMULARY MEDICATION flinstone vitamin    Historical Provider, MD  aspirin 81 MG tablet Take 81 mg by mouth daily.    Historical Provider, MD  L-Lysine 500 MG TABS Take by mouth.    Historical Provider, MD  Multiple Vitamins-Minerals (CENTRUM SILVER ULTRA MENS) TABS Take by mouth.    Historical Provider, MD  niacin 500 MG tablet Take 500 mg by mouth daily with breakfast.    Historical Provider, MD   BP 138/85 mmHg  Pulse 84  Temp(Src) 98.7 F (37.1 C) (Oral)  Resp 18  Ht  (1.803 m)  Wt 195 lb (88.451 kg)  BMI 27.21 kg/m2  SpO2 100% Physical Exam  Constitutional: He is oriented to person, place, and time. He appears well-developed and well-nourished.  HENT:  Head: Normocephalic and atraumatic.  Right Ear: External ear normal.  Left Ear: External ear normal.  Nose: Nose normal.  Eyes: Right eye exhibits no discharge. Left eye exhibits no discharge.  Neck: Neck supple.  Cardiovascular: Normal rate and intact distal pulses.   Pulses:      Radial pulses are 2+ on the left side.  Pulmonary/Chest: Effort normal.  Abdominal: He exhibits no distension.  Musculoskeletal: He exhibits tenderness. He exhibits no edema.  Left hand ulnar dorsal aspect with diffuse swelling and warmth No  palpable abscess; No drainage; No crepitus No wrist swelling or pain. Full ROM of hand Small abrasion over 4th MCP   Neurological: He is alert and oriented to person, place, and time.  Skin: Skin is warm and dry. No erythema.  Nursing note and vitals reviewed.   ED Course  Procedures (including critical care time) DIAGNOSTIC STUDIES: Oxygen Saturation is 100% on RA, normal by my interpretation.    COORDINATION OF CARE: 11:10 PM- Pt advised of plan for treatment and pt agrees.    Labs Review Labs Reviewed  BASIC METABOLIC PANEL - Abnormal; Notable for the following:    Glucose, Bld 111 (*)    All other components within normal limits  CBC WITH  DIFFERENTIAL/PLATELET - Abnormal; Notable for the following:    WBC 11.0 (*)    RBC 4.17 (*)    Neutro Abs 8.0 (*)    Monocytes Relative 13 (*)    Monocytes Absolute 1.4 (*)    All other components within normal limits    Imaging Review Dg Hand Complete Left  04/02/2015   CLINICAL DATA:  Acute onset of gradually worsening pain and swelling at the left hand, with chills and fever. Initial encounter.  EXAM: LEFT HAND - COMPLETE 3+ VIEW  COMPARISON:  Left hand radiographs performed 08/23/2013  FINDINGS: There is no evidence of fracture or dislocation. No acute osseous erosions are seen.  Degenerative change is noted at the first carpometacarpal joint. The carpal rows are intact, and demonstrate normal alignment. Known soft tissue swelling is not well characterized on radiograph.  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 No evidence of fracture or dislocation. No evidence of osseous erosion.   Electronically Signed   By: Roanna Raider M.D.   On: 04/02/2015 00:28   I have personally reviewed and evaluated these images and lab results as part of my medical decision-making.   EKG Interpretation None      MDM   Final diagnoses:  Cellulitis of left hand    Patient with an insect or spider bite versus cellulitis. No palpable abscess. Given the rapidity of swelling and onset of symptoms with fever this could actually be a spider bite. No signs of more severe disease such as necrosis or crepitus. Hemodynamically stable. At this point will treat with NSAIDs, ice, and antibiotics. Patient to be referred to his PCP in the next couple days, discussed strict return precautions of any skin changes or worsening symptoms are noted. Patient's last tetanus shot was one year ago. Patient declines any stronger pain medicine.  I personally performed the services described in this documentation, which was scribed in my presence. The recorded information has been reviewed and is accurate.    Fred Loveless, MD 04/02/15 [id number]

## 2015-04-02 MED ORDER — CLINDAMYCIN HCL 150 MG PO CAPS: 450.0000 mg | ORAL_CAPSULE | Freq: Four times a day (QID) | ORAL | Status: DC

## 2015-06-02 ENCOUNTER — Ambulatory Visit (INDEPENDENT_AMBULATORY_CARE_PROVIDER_SITE_OTHER): Payer: PRIVATE HEALTH INSURANCE | Admitting: Family Medicine

## 2015-06-02 ENCOUNTER — Encounter: Payer: Self-pay | Admitting: Family Medicine

## 2015-06-02 ENCOUNTER — Telehealth: Payer: Self-pay | Admitting: Family Medicine

## 2015-06-02 ENCOUNTER — Ambulatory Visit (INDEPENDENT_AMBULATORY_CARE_PROVIDER_SITE_OTHER): Payer: PRIVATE HEALTH INSURANCE

## 2015-06-02 VITALS — BP 120/80 | HR 69 | Wt 194.0 lb

## 2015-06-02 DIAGNOSIS — Z8709 Personal history of other diseases of the respiratory system: Secondary | ICD-10-CM | POA: Diagnosis not present

## 2015-06-02 DIAGNOSIS — R0782 Intercostal pain: Secondary | ICD-10-CM | POA: Diagnosis not present

## 2015-06-02 DIAGNOSIS — R079 Chest pain, unspecified: Secondary | ICD-10-CM

## 2015-06-02 DIAGNOSIS — Z8701 Personal history of pneumonia (recurrent): Secondary | ICD-10-CM | POA: Diagnosis not present

## 2015-06-02 LAB — CBC
HEMATOCRIT: 42 % (ref 39.0–52.0)
HEMOGLOBIN: 15.2 g/dL (ref 13.0–17.0)
MCH: 33.8 pg (ref 26.0–34.0)
MCHC: 36.2 g/dL — AB (ref 30.0–36.0)
MCV: 93.3 fL (ref 78.0–100.0)
MPV: 8.8 fL (ref 8.6–12.4)
Platelets: 217 10*3/uL (ref 150–400)
RBC: 4.5 MIL/uL (ref 4.22–5.81)
RDW: 12.9 % (ref 11.5–15.5)
WBC: 6.2 10*3/uL (ref 4.0–10.5)

## 2015-06-02 LAB — BASIC METABOLIC PANEL
BUN: 11 mg/dL (ref 7–25)
CALCIUM: 9.1 mg/dL (ref 8.6–10.3)
CO2: 24 mmol/L (ref 20–31)
CREATININE: 0.92 mg/dL (ref 0.70–1.25)
Chloride: 102 mmol/L (ref 98–110)
GLUCOSE: 89 mg/dL (ref 65–99)
Potassium: 4 mmol/L (ref 3.5–5.3)
Sodium: 136 mmol/L (ref 135–146)

## 2015-06-02 LAB — D-DIMER, QUANTITATIVE (NOT AT ARMC): D DIMER QUANT: 0.5 ug{FEU}/mL — AB (ref 0.00–0.48)

## 2015-06-02 NOTE — Patient Instructions (Addendum)
Thank you for coming in today. We will call with results tomorrow.  Call or go to the emergency room if you get worse, have trouble breathing, have chest pains, or palpitations.   Nonspecific Chest Pain  Chest pain can be caused by many different conditions. There is always a chance that your pain could be related to something serious, such as a heart attack or a blood clot in your lungs. Chest pain can also be caused by conditions that are not life-threatening. If you have chest pain, it is very important to follow up with your health care provider. CAUSES  Chest pain can be caused by:  Heartburn.  Pneumonia or bronchitis.  Anxiety or stress.  Inflammation around your heart (pericarditis) or lung (pleuritis or pleurisy).  A blood clot in your lung.  A collapsed lung (pneumothorax). It can develop suddenly on its own (spontaneous pneumothorax) or from trauma to the chest.  Shingles infection (varicella-zoster virus).  Heart attack.  Damage to the bones, muscles, and cartilage that make up your chest wall. This can include:  Bruised bones due to injury.  Strained muscles or cartilage due to frequent or repeated coughing or overwork.  Fracture to one or more ribs.  Sore cartilage due to inflammation (costochondritis). RISK FACTORS  Risk factors for chest pain may include:  Activities that increase your risk for trauma or injury to your chest.  Respiratory infections or conditions that cause frequent coughing.  Medical conditions or overeating that can cause heartburn.  Heart disease or family history of heart disease.  Conditions or health behaviors that increase your risk of developing a blood clot.  Having had chicken pox (varicella zoster). SIGNS AND SYMPTOMS Chest pain can feel like:  Burning or tingling on the surface of your chest or deep in your chest.  Crushing, pressure, aching, or squeezing pain.  Dull or sharp pain that is worse when you move, cough, or  take a deep breath.  Pain that is also felt in your back, neck, shoulder, or arm, or pain that spreads to any of these areas. Your chest pain may come and go, or it may stay constant. DIAGNOSIS Lab tests or other studies may be needed to find the cause of your pain. Your health care provider may have you take a test called an ambulatory ECG (electrocardiogram). An ECG records your heartbeat patterns at the time the test is performed. You may also have other tests, such as:  Transthoracic echocardiogram (TTE). During echocardiography, sound waves are used to create a picture of all of the heart structures and to look at how blood flows through your heart.  Transesophageal echocardiogram (TEE).This is a more advanced imaging test that obtains images from inside your body. It allows your health care provider to see your heart in finer detail.  Cardiac monitoring. This allows your health care provider to monitor your heart rate and rhythm in real time.  Holter monitor. This is a portable device that records your heartbeat and can help to diagnose abnormal heartbeats. It allows your health care provider to track your heart activity for several days, if needed.  Stress tests. These can be done through exercise or by taking medicine that makes your heart beat more quickly.  Blood tests.  Imaging tests. TREATMENT  Your treatment depends on what is causing your chest pain. Treatment may include:  Medicines. These may include:  Acid blockers for heartburn.  Anti-inflammatory medicine.  Pain medicine for inflammatory conditions.  Antibiotic medicine, if an  infection is present.  Medicines to dissolve blood clots.  Medicines to treat coronary artery disease.  Supportive care for conditions that do not require medicines. This may include:  Resting.  Applying heat or cold packs to injured areas.  Limiting activities until pain decreases. HOME CARE INSTRUCTIONS  If you were prescribed  an antibiotic medicine, finish it all even if you start to feel better.  Avoid any activities that bring on chest pain.  Do not use any tobacco products, including cigarettes, chewing tobacco, or electronic cigarettes. If you need help quitting, ask your health care provider.  Do not drink alcohol.  Take medicines only as directed by your health care provider.  Keep all follow-up visits as directed by your health care provider. This is important. This includes any further testing if your chest pain does not go away.  If heartburn is the cause for your chest pain, you may be told to keep your head raised (elevated) while sleeping. This reduces the chance that acid will go from your stomach into your esophagus.  Make lifestyle changes as directed by your health care provider. These may include:  Getting regular exercise. Ask your health care provider to suggest some activities that are safe for you.  Eating a heart-healthy diet. A registered dietitian can help you to learn healthy eating options.  Maintaining a healthy weight.  Managing diabetes, if necessary.  Reducing stress. SEEK MEDICAL CARE IF:  Your chest pain does not go away after treatment.  You have a rash with blisters on your chest.  You have a fever. SEEK IMMEDIATE MEDICAL CARE IF:   Your chest pain is worse.  You have an increasing cough, or you cough up blood.  You have severe abdominal pain.  You have severe weakness.  You faint.  You have chills.  You have sudden, unexplained chest discomfort.  You have sudden, unexplained discomfort in your arms, back, neck, or jaw.  You have shortness of breath at any time.  You suddenly start to sweat, or your skin gets clammy.  You feel nauseous or you vomit.  You suddenly feel light-headed or dizzy.  Your heart begins to beat quickly, or it feels like it is skipping beats. These symptoms may represent a serious problem that is an emergency. Do not wait to  see if the symptoms will go away. Get medical help right away. Call your local emergency services (911 in the [place] Do not drive yourself to the hospital.   This information is not intended to replace advice given to you by your health care provider. Make sure you discuss any questions you have with your health care provider.   Document Released: 04/25/2005 Document Revised: 08/06/2014 Document Reviewed: 02/19/2014 Elsevier Interactive Patient Education [employer]2016 [employer]

## 2015-06-02 NOTE — Telephone Encounter (Signed)
[facility]  [facility].    Called pt and discussed results.  D-dimer is borderline elevated.  Office will call him in AM. Dr. Denyse Amassorey may decide to order a Chest CT for further evaluation. Patient says he feels about the same, not worse.

## 2015-06-02 NOTE — Progress Notes (Signed)
Fred HoldingCharles Bond is a 64 y.o. male [name] for chest pain.  Chest pain first noticed 1 week ago. He describes the pain as aching, 3-7/10 in severity with no clear precipitant, intermittent, with occasional superior radiation to the level of the nipple. He is able to reproduce the pain with palpation and also notes that applying firm pressure will alleviate the pain at times. He takes 2 Aleve daily for knee pain which has not helped the pain. He thinks overall the episodes of pain have gotten gradually worse, which prompted him to seek care. The previous day he had taken a 7 hour car ride to go on vacation. He also noted carrying several bags from the car to the house which was "more than I should have been carrying". He did note the pain was exacerbated yesterday and recalled he had been carrying several coolers earlier in the day.   He denies association with exertion, though does note the pain sometimes gets better with rest. He denies nausea, vomiting, dizziness, diaphoresis, palpitations. He denies unilateral leg pain/swelling/erythema, pleuritic chest pain, cough, hemoptysis.  He does note a history of his "heart stopping then needing 2 beats to get back going" for which he was evaluated by a cardiologist 5 years ago with negative stress and echo and never followed up after a year.    Past Medical History  Diagnosis Date  . Asthma 08/07/2012    Childhood   . History of iron deficiency 08/07/2012  . Recurrent pneumonia 08/07/2012  . Tear of meniscus of left knee 08/07/2012   No past surgical history on file. Social History  Substance Use Topics  . Smoking status: Never Smoker   . Smokeless tobacco: Not on file  . Alcohol Use: No   family history includes Alcohol abuse in an other family member; Heart attack in an other family member.  ROS as above Medications: Current Outpatient Prescriptions  Medication Sig Dispense Refill  .  AMBULATORY NON FORMULARY MEDICATION flinstone vitamin    . aspirin 81 MG tablet Take 81 mg by mouth daily.    Marland Kitchen. L-Lysine 500 MG TABS Take by mouth.    . Multiple Vitamins-Minerals (CENTRUM SILVER ULTRA MENS) TABS Take by mouth.    . niacin 500 MG tablet Take 500 mg by mouth daily with breakfast.     No current facility-administered medications for this visit.   No Known Allergies   Exam:  BP 120/80 mmHg  Pulse 69  Wt 194 lb (87.998 kg)  SpO2 99% Gen: Well NAD nontoxic appearing HEENT: EOMI,  MMM Lungs: Normal work of breathing. CTABL, no crackles.  Heart: RRR no MRG, pulses 2+ x 4 Abd: NABS, Soft. Nondistended, nontender.  Exts: Brisk capillary refill, warm and well perfused. No edema palpable cords or calf erythema. Skin: warm, pink, dry.  MSK: pain reproduced with palpation of the subcostal cartilage, not reproduced with serrator testing   Twelve-lead EKG: Sinus bradycardia at 56 bpm. No ST segment elevation or depression. No Q waves. No inverted T waves. Normal intervals and axis. Normal EKG. No studies to compare to.  No results found for this or any previous visit (from the past 24 hour(s)). No results found.    Please see individual assessment and plan sections.

## 2015-06-02 NOTE — Assessment & Plan Note (Addendum)
Most likely due to costochondritis vs Rib 9-10 intercostal muscle strain given the reproducibility with palpation and certain movements such as carrying loads. Patient was counseled on resting and using NSAIDs to control this pain and instructed to RTC if problem persists. 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 PE was considered given immobility day prior, but low pre-test probability given Wells Score of 0. Given low probability, we will get a D-Dimer with CBC and CMP. We will get CXR to rule out occult pulmonary etiologies.

## 2015-06-03 ENCOUNTER — Telehealth: Payer: Self-pay

## 2015-06-03 NOTE — Telephone Encounter (Signed)
Fred Bond does not want to have the CT due to the cost. He states it is probably just a pulled muscle and might go away. I advised him the CT is needed to rule out a blood clot. He is still leaning toward not having the CT.

## 2015-06-03 NOTE — Addendum Note (Signed)
Addended by: Rodolph BongOREY, Arvie Bartholomew S on: 06/03/2015 08:11 AM   Modules accepted: Orders

## 2015-06-03 NOTE — Progress Notes (Signed)
Quick Note:  D-Dimer is elevated. I have ordered a CT angiogram scan of the chest stat. Please let the patient know when he can come in for the scan. ______

## 2015-06-09 NOTE — Addendum Note (Signed)
Addended by: Collie SiadICHARDSON, Kannon Granderson M on: 06/09/2015 02:35 PM   Modules accepted: Orders

## 2015-11-16 ENCOUNTER — Encounter: Payer: Self-pay | Admitting: Family Medicine

## 2015-11-16 ENCOUNTER — Ambulatory Visit (INDEPENDENT_AMBULATORY_CARE_PROVIDER_SITE_OTHER): Payer: PRIVATE HEALTH INSURANCE | Admitting: Family Medicine

## 2015-11-16 VITALS — BP 130/75 | HR 63 | Wt 196.0 lb

## 2015-11-16 DIAGNOSIS — S83207A Unspecified tear of unspecified meniscus, current injury, left knee, initial encounter: Secondary | ICD-10-CM | POA: Diagnosis not present

## 2015-11-16 DIAGNOSIS — Z125 Encounter for screening for malignant neoplasm of prostate: Secondary | ICD-10-CM

## 2015-11-16 DIAGNOSIS — Z0000 Encounter for general adult medical examination without abnormal findings: Secondary | ICD-10-CM | POA: Diagnosis not present

## 2015-11-16 DIAGNOSIS — N529 Male erectile dysfunction, unspecified: Secondary | ICD-10-CM

## 2015-11-16 MED ORDER — ESCITALOPRAM OXALATE 20 MG PO TABS: 20.0000 mg | ORAL_TABLET | Freq: Every day | ORAL | Status: DC

## 2015-11-16 MED ORDER — SILDENAFIL CITRATE 100 MG PO TABS: 100.0000 mg | ORAL_TABLET | Freq: Every day | ORAL | Status: DC | PRN

## 2015-11-16 MED ORDER — ZOSTER VACCINE LIVE 19400 UNT/0.65ML ~~LOC~~ SOLR: 0.6500 mL | Freq: Once | SUBCUTANEOUS | Status: DC

## 2015-11-16 NOTE — Progress Notes (Signed)
CC: Fred Bond is a 65 y.o. male is here for Annual Exam   Subjective: HPI:  Colonoscopy: was normal in Dec 2015 Prostate: Discussed screening risks/beneifts with patient today, due for PSA  Influenza Vaccine: Out of season out of season Pneumovax: No current indication Td/Tdap: UTD from 2015 Zoster: Was given a prescription for this today and encouraged to get it at [facility] in the near future  Requesting complete physical exam -Would like referral to an orthopedist to talk about possible knee replacement following since he's been experiences knee pain for the last year ever since he has an arthroscopy on his left knee. -Would like to know if he can have Viagra, he is having difficulty with erectile dysfunction -Would like to have something for mood, he is felt stressed out and overwhelmed ever since he lost his job one year ago, is also in legal proceedings with his ex-wife about alimony. He's tolerated Lexapro in the past for similar symptoms  Review of Systems - General ROS: negative for - chills, fever, night sweats, weight gain or weight loss Ophthalmic ROS: negative for - decreased vision ENT ROS: negative for - hearing change, nasal congestion, tinnitus or allergies  Hematological and Lymphatic ROS: negative for - bleeding problems, bruising or swollen lymph nodes Breast ROS: negative Respiratory ROS: no cough, shortness of breath, or wheezing Cardiovascular ROS: no chest pain or dyspnea on exertion Gastrointestinal ROS: no abdominal pain, change in bowel habits, or black or bloody stools Genito-Urinary ROS: negative for - genital discharge, genital ulcers, incontinence or abnormal bleeding from genitals Musculoskeletal ROS: negative for - joint pain or muscle pain other than that described above Neurological ROS: negative for - headaches or memory loss Dermatological ROS: negative for lumps, mole changes, rash and skin lesion changes and scarring  Past Medical History   Diagnosis Date  . Asthma 08/07/2012    Childhood   . History of iron deficiency 08/07/2012  . Recurrent pneumonia 08/07/2012  . Tear of meniscus of left knee 08/07/2012    No past surgical history on file. Family History  Problem Relation Age of Onset  . Alcohol abuse      grandfather  . Heart attack      grandparents    Social History   Social History  . Marital Status: Married    Spouse Name: N/A  . Number of Children: N/A  . Years of Education: N/A   Occupational History  . Not on file.   Social History Main Topics  . Smoking status: Never Smoker   . Smokeless tobacco: Not on file  . Alcohol Use: No  . Drug Use: No  . Sexual Activity: Not on file   Other Topics Concern  . Not on file   Social History Narrative     Objective: BP 130/75 mmHg  Pulse 63  Wt 196 lb (88.905 kg)  General: No Acute Distress HEENT: Atraumatic, normocephalic, conjunctivae normal without scleral icterus.  No nasal discharge, hearing grossly intact, TMs with good landmarks bilaterally with no middle ear abnormalities, posterior pharynx clear without oral lesions. Neck: Supple, trachea midline, no cervical nor supraclavicular adenopathy. Pulmonary: Clear to auscultation bilaterally without wheezing, rhonchi, nor rales. Cardiac: Regular rate and rhythm.  No murmurs, rubs, nor gallops. No peripheral edema.  2+ peripheral pulses bilaterally. Abdomen: Bowel sounds normal.  No masses.  Non-tender without rebound.  Negative Murphy's sign. MSK: Grossly intact, no signs of weakness.  Full strength throughout upper and lower extremities.  Full ROM in  upper and lower extremities.  No midline spinal tenderness. Neuro: Gait unremarkable, CN II-XII grossly intact.  C5-C6 Reflex 2/4 Bilaterally, L4 Reflex 2/4 Bilaterally.  Cerebellar function intact. Skin: No rashes. Psych: Alert and oriented to person/place/time.  Thought process normal. No anxiety/depression.  Assessment & Plan: [name] was seen today for  annual exam.  Diagnoses and all orders for this visit:  Annual physical exam -     Lipid panel -     COMPLETE METABOLIC PANEL WITH GFR -     CBC -     PSA -     Discontinue: zoster vaccine live, PF, (ZOSTAVAX) [id number] UNT/0.65ML injection; Inject 19,400 Units into the skin once. -     zoster vaccine live, PF, (ZOSTAVAX) [id number] UNT/0.65ML injection; Inject 19,400 Units into the skin once.  Screening for prostate cancer -     PSA  Acute meniscal tear of left knee, initial encounter -     Ambulatory referral to Orthopedic Surgery  Erectile dysfunction, unspecified erectile dysfunction type -     sildenafil (VIAGRA) 100 MG tablet; Take 1 tablet (100 mg total) by mouth daily as needed for erectile dysfunction.  Other orders -     escitalopram (LEXAPRO) 20 MG tablet; Take 1 tablet (20 mg total) by mouth daily.  Healthy lifestyle interventions including but not limited to regular exercise, a healthy low fat diet, moderation of salt intake, the dangers of tobacco/alcohol/recreational drug use, nutrition supplementation, and accident avoidance were discussed with the patient and a handout was provided for future reference. Starting Lexapro to help with mood stabilization and encouraged to follow up in one month if not helpful.  Return in about 3 months (around 02/15/2016) for Mood (if  needed) .

## 2015-11-17 LAB — LIPID PANEL
Cholesterol: 198 mg/dL (ref 125–200)
HDL: 60 mg/dL (ref >=40)
LDL CALC: 121 mg/dL (ref <130)
TRIGLYCERIDES: 84 mg/dL (ref <150)
Total CHOL/HDL Ratio: 3.3 Ratio (ref <=5.0)
VLDL: 17 mg/dL (ref <30)

## 2015-11-17 LAB — COMPLETE METABOLIC PANEL WITH GFR
ALBUMIN: 3.8 g/dL (ref 3.6–5.1)
ALK PHOS: 62 U/L (ref 40–115)
ALT: 30 U/L (ref 9–46)
AST: 32 U/L (ref 10–35)
BILIRUBIN TOTAL: 0.5 mg/dL (ref 0.2–1.2)
BUN: 10 mg/dL (ref 7–25)
CO2: 27 mmol/L (ref 20–31)
Calcium: 9.1 mg/dL (ref 8.6–10.3)
Chloride: 102 mmol/L (ref 98–110)
Creat: 0.96 mg/dL (ref 0.70–1.25)
GFR, EST NON AFRICAN AMERICAN: 84 mL/min (ref >=60)
Glucose, Bld: 75 mg/dL (ref 65–99)
Potassium: 4.2 mmol/L (ref 3.5–5.3)
Sodium: 138 mmol/L (ref 135–146)
TOTAL PROTEIN: 6.6 g/dL (ref 6.1–8.1)

## 2015-11-17 LAB — CBC
HCT: 41.3 % (ref 38.5–50.0)
Hemoglobin: 13.9 g/dL (ref 13.2–17.1)
MCH: 32.9 pg (ref 27.0–33.0)
MCHC: 33.7 g/dL (ref 32.0–36.0)
MCV: 97.6 fL (ref 80.0–100.0)
MPV: 9.3 fL (ref 7.5–12.5)
PLATELETS: 208 10*3/uL (ref 140–400)
RBC: 4.23 MIL/uL (ref 4.20–5.80)
RDW: 14 % (ref 11.0–15.0)
WBC: 4.7 10*3/uL (ref 3.8–10.8)

## 2015-11-18 LAB — PSA: PSA: 1.62 ng/mL (ref <=4.00)

## 2015-12-23 ENCOUNTER — Encounter: Payer: Self-pay | Admitting: Sports Medicine

## 2015-12-23 ENCOUNTER — Ambulatory Visit (INDEPENDENT_AMBULATORY_CARE_PROVIDER_SITE_OTHER): Payer: PRIVATE HEALTH INSURANCE | Admitting: Sports Medicine

## 2015-12-23 VITALS — BP 134/70 | HR 56 | Resp 18 | Wt 198.8 lb

## 2015-12-23 DIAGNOSIS — M7021 Olecranon bursitis, right elbow: Secondary | ICD-10-CM | POA: Diagnosis not present

## 2015-12-23 MED ORDER — DOXYCYCLINE HYCLATE 100 MG PO TABS: 100.0000 mg | ORAL_TABLET | Freq: Two times a day (BID) | ORAL | Status: AC

## 2015-12-23 MED ORDER — HYDROCODONE-ACETAMINOPHEN 5-325 MG PO TABS: 1.0000 | ORAL_TABLET | Freq: Three times a day (TID) | ORAL | Status: DC | PRN

## 2015-12-23 NOTE — Progress Notes (Signed)
   Subjective:    I'm seeing this patient as a consultation for:  Dr. Laren BoomSean Bond  CC: Right elbow swelling  HPI: For several weeks now this pleasant 65 year old male has had erythema, swelling over his right elbow, moderate, persistent, no constitutional symptoms. It was drained in the emergency department and reaccumulated per patient report. He was treated with Keflex.  Past medical history, Surgical history, Family history not pertinant except as noted below, Social history, Allergies, and medications have been entered into the medical record, reviewed, and no changes needed.   Review of Systems: No headache, visual changes, nausea, vomiting, diarrhea, constipation, dizziness, abdominal pain, skin rash, fevers, chills, night sweats, weight loss, swollen lymph nodes, body aches, joint swelling, muscle aches, chest pain, shortness of breath, mood changes, visual or auditory hallucinations.   Objective:   General: Well Developed, well nourished, and in no acute distress.  Neuro/Psych: Alert and oriented x3, extra-ocular muscles intact, able to move all 4 extremities, sensation grossly intact. Skin: Warm and dry, no rashes noted.  Respiratory: Not using accessory muscles, speaking in full sentences, trachea midline.  Cardiovascular: Pulses palpable, no extremity edema. Abdomen: Does not appear distended. Right Elbow: Visible swollen, erythematous olecranon bursa with minimal tenderness. Range of motion full pronation, supination, flexion, extension. Strength is full to all of the above directions Stable to varus, valgus stress. Negative moving valgus stress test. No discrete areas of tenderness to palpation. Ulnar nerve does not sublux. Negative cubital tunnel Tinel's.  Complicated Incision and drainage of septic olecranon bursitis. Risks, benefits, and alternatives explained and consent obtained. Time out conducted. Surface cleaned with alcohol. 10cc lidocaine with epinephine  infiltrated around abscess. Adequate anesthesia ensured. Area prepped and draped in a sterile fashion. #11 blade used to make a stab incision into abscess. Pus expressed with pressure. Curved hemostat used to explore 4 quadrants and loculations broken up. Further purulence expressed. Iodoform packing placed leaving a 1-inch tail. Hemostasis achieved. Pt stable. Aftercare and follow-up advised.  Impression and Recommendations:   This case required medical decision making of moderate complexity.

## 2015-12-23 NOTE — Assessment & Plan Note (Signed)
Suspect septic olecranon bursitis, wound culture obtained. Incision and drainage with packing. Doxycycline, hydrocodone, patient will remove 2 inches of packing per day leaving a 1 inch tail, return to see me in one week.

## 2015-12-26 LAB — WOUND CULTURE
Gram Stain: NONE SEEN
Gram Stain: NONE SEEN
Gram Stain: NONE SEEN
Organism ID, Bacteria: NO GROWTH

## 2015-12-29 ENCOUNTER — Ambulatory Visit (INDEPENDENT_AMBULATORY_CARE_PROVIDER_SITE_OTHER): Payer: PRIVATE HEALTH INSURANCE | Admitting: Sports Medicine

## 2015-12-29 VITALS — BP 106/65 | HR 60 | Resp 16 | Wt 199.9 lb

## 2015-12-29 DIAGNOSIS — M7021 Olecranon bursitis, right elbow: Secondary | ICD-10-CM

## 2015-12-29 NOTE — Assessment & Plan Note (Signed)
Continue antibiotics, redressed, return in one week. Doing excellent.

## 2015-12-29 NOTE — Progress Notes (Signed)
  Subjective: 1 week post I&D of a right olecranon bursitis, although packing is out, feels much better, has about 5-6 more days of antibiotics.   Objective: General: Well-developed, well-nourished, and in no acute distress. Right elbow: Erythema is minimal, improving, no packing, still open. No tenderness to palpation. No drainage.  Assessment/plan:

## 2016-01-05 ENCOUNTER — Encounter: Payer: Self-pay | Admitting: Sports Medicine

## 2016-01-05 ENCOUNTER — Ambulatory Visit (INDEPENDENT_AMBULATORY_CARE_PROVIDER_SITE_OTHER): Payer: PRIVATE HEALTH INSURANCE | Admitting: Sports Medicine

## 2016-01-05 VITALS — BP 135/74 | HR 54 | Resp 16 | Wt 199.8 lb

## 2016-01-05 DIAGNOSIS — M7021 Olecranon bursitis, right elbow: Secondary | ICD-10-CM

## 2016-01-05 NOTE — Assessment & Plan Note (Signed)
Overall resolved, Dermabond applied. We will keep an eye out for return of erythema, and for persistence of the fistula.

## 2016-01-05 NOTE — Progress Notes (Signed)
  Subjective:    CC: Follow-up  HPI: 2 weeks post incision and drainage with packing of a right olecranon bursitis, septic. This is all but completely resolved, there is only a scant amount of drainage. No pain, no erythema, no constitutional symptoms.  Past medical history, Surgical history, Family history not pertinant except as noted below, Social history, Allergies, and medications have been entered into the medical record, reviewed, and no changes needed.   Review of Systems: No fevers, chills, night sweats, weight loss, chest pain, or shortness of breath.   Objective:    General: Well Developed, well nourished, and in no acute distress.  Neuro: Alert and oriented x3, extra-ocular muscles intact, sensation grossly intact.  HEENT: Normocephalic, atraumatic, pupils equal round reactive to light, neck supple, no masses, no lymphadenopathy, thyroid nonpalpable.  Skin: Warm and dry, no rashes. Cardiac: Regular rate and rhythm, no murmurs rubs or gallops, no lower extremity edema.  Respiratory: Clear to auscultation bilaterally. Not using accessory muscles, speaking in full sentences. Right Elbow: Unremarkable to inspection. Range of motion full pronation, supination, flexion, extension. Strength is full to all of the above directions Stable to varus, valgus stress. Negative moving valgus stress test. No discrete areas of tenderness to palpation. Ulnar nerve does not sublux. Negative cubital tunnel Tinel's. Only a minimal scar is visible, only a scant amount of drainage to suggest fistula, this was covered with Dermabond. No erythema, induration, or tenderness.  Impression and Recommendations:

## 2016-02-09 ENCOUNTER — Other Ambulatory Visit: Payer: Self-pay | Admitting: Family Medicine

## 2016-03-10 ENCOUNTER — Other Ambulatory Visit: Payer: Self-pay | Admitting: Family Medicine

## 2016-03-25 ENCOUNTER — Other Ambulatory Visit: Payer: Self-pay | Admitting: Family Medicine

## 2016-03-26 ENCOUNTER — Encounter: Payer: Self-pay | Admitting: Family Medicine

## 2016-03-26 ENCOUNTER — Ambulatory Visit (INDEPENDENT_AMBULATORY_CARE_PROVIDER_SITE_OTHER): Payer: PRIVATE HEALTH INSURANCE | Admitting: Family Medicine

## 2016-03-26 VITALS — BP 116/78 | HR 67 | Wt 204.0 lb

## 2016-03-26 DIAGNOSIS — Z23 Encounter for immunization: Secondary | ICD-10-CM | POA: Diagnosis not present

## 2016-03-26 DIAGNOSIS — Z638 Other specified problems related to primary support group: Secondary | ICD-10-CM

## 2016-03-26 DIAGNOSIS — R413 Other amnesia: Secondary | ICD-10-CM | POA: Diagnosis not present

## 2016-03-26 DIAGNOSIS — F439 Reaction to severe stress, unspecified: Secondary | ICD-10-CM | POA: Insufficient documentation

## 2016-03-26 MED ORDER — ESCITALOPRAM OXALATE 20 MG PO TABS: 20.0000 mg | ORAL_TABLET | Freq: Every day | ORAL | 1 refills | Status: DC

## 2016-03-26 MED ORDER — SILDENAFIL CITRATE 20 MG PO TABS: ORAL_TABLET | ORAL | 11 refills | Status: DC

## 2016-03-26 NOTE — Progress Notes (Signed)
CC: Fred Bond is a 65 y.o. male is here for Medication Refill   Subjective: HPI:  He believes the Lexapro is greatly helping his anxiousness and irritability. He still thinks that there is room for improvement and he would like to see a counselor to talk about his past with [employer] in being terminated. He denies any known side effects from the above medication. He denies any mental disturbances of that described above.  He is concerned that he might be experiencing some memory loss. 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 There's been no other disorientation or cognitive problems. He denies any long-term memory loss. Nothing seems to make symptoms better or worse. It is mild in severity but his grandmother suffered from Alzheimer's   Review Of Systems Outlined In HPI  Past Medical History:  Diagnosis Date  . Asthma 08/07/2012   Childhood   . History of iron deficiency 08/07/2012  . Recurrent pneumonia 08/07/2012  . Tear of meniscus of left knee 08/07/2012    No past surgical history on file. Family History  Problem Relation Age of Onset  . Alcohol abuse      grandfather  . Heart attack      grandparents    Social History   Social History  . Marital status: Married    Spouse name: N/A  . Number of children: N/A  . Years of education: N/A   Occupational History  . Not on file.   Social History Main Topics  . Smoking status: Never Smoker  . Smokeless tobacco: Not on file  . Alcohol use No  . Drug use: No  . Sexual activity: Not on file   Other Topics Concern  . Not on file   Social History Narrative  . No narrative on file     Objective: BP 116/78   Pulse 67   Wt 204 lb (92.5 kg)   BMI 28.45 kg/m   Vital signs reviewed. General: Alert and Oriented, No Acute Distress HEENT: Pupils equal, round, reactive to light. Conjunctivae clear.   External ears unremarkable.  Moist mucous membranes. Lungs: Clear and comfortable work of breathing, speaking in full sentences without accessory muscle use. Cardiac: Regular rate and rhythm.  Neuro: CN II-XII grossly intact, gait normal. Extremities: No peripheral edema.  Strong peripheral pulses.  Mental Status: No depression, anxiety, nor agitation. Logical though process. Skin: Warm and dry.  Assessment & Plan: Fred Bond was seen today for medication refill.  Diagnoses and all orders for this visit:  Memory loss  Stress at home  Needs flu shot -     Flu Vaccine QUAD 36+ mos IM  Other orders -     sildenafil (REVATIO) 20 MG tablet; 1-4 tabs by mouth as needed for intercourse. -     Discontinue: escitalopram (LEXAPRO) 20 MG tablet; Take 1 tablet (20 mg total) by mouth daily. NEED FOLLOW UP APPOINTMENT FOR MORE REFILLS -     escitalopram (LEXAPRO) 20 MG tablet; Take 1 tablet (20 mg total) by mouth daily.   He is requesting a refill on generic Viagra Stress  and anxiety: continue Lexapro, I gave him the contact info for awakenings for counseling Memory loss: He scored 30 out of 30 with no difficulty on the MMSE, I discussed we can use this as a baseline if we ever  need to pursue possible memory loss in the future however there is no signs of significant memory loss at this time.  25 minutes spent face-to-face during visit today of which at least 50% was counseling or coordinating care regarding: 1. Memory loss   2. Stress at home   3. Needs flu shot     Discussed with this patient that I will be resigning from my position here with [facility] in September in order to stay with my family who will be moving to [facility]. I let him know about the providers that are still accepting patients and I feel that this individual will be under great care if he/she stays here with [facility]. Return in about 3 months (around 06/26/2016) for Establish Care Visit.

## 2016-04-05 ENCOUNTER — Ambulatory Visit (INDEPENDENT_AMBULATORY_CARE_PROVIDER_SITE_OTHER): Payer: PRIVATE HEALTH INSURANCE | Admitting: Sports Medicine

## 2016-04-05 ENCOUNTER — Ambulatory Visit (INDEPENDENT_AMBULATORY_CARE_PROVIDER_SITE_OTHER): Payer: PRIVATE HEALTH INSURANCE

## 2016-04-05 DIAGNOSIS — M1712 Unilateral primary osteoarthritis, left knee: Secondary | ICD-10-CM

## 2016-04-05 DIAGNOSIS — M25461 Effusion, right knee: Secondary | ICD-10-CM | POA: Diagnosis not present

## 2016-04-05 MED ORDER — HYDROCODONE-ACETAMINOPHEN 5-325 MG PO TABS: 1.0000 | ORAL_TABLET | Freq: Three times a day (TID) | ORAL | 0 refills | Status: DC | PRN

## 2016-04-05 NOTE — Progress Notes (Signed)
   Subjective:    I'm seeing this patient as a consultation for:  Dr. Laren BoomSean Hommel  CC: Right knee pain  HPI: This is a pleasant 65 year old male, earlier he rolled over and had immediate pain on the back of his knee, immediate swelling. Inability to bear weight. Pain is severe, persistent without radiation. No mechanical symptoms with the exception of some buckling. He does have a history of severe knee osteoarthritis.  Past medical history, Surgical history, Family history not pertinant except as noted below, Social history, Allergies, and medications have been entered into the medical record, reviewed, and no changes needed.   Review of Systems: No headache, visual changes, nausea, vomiting, diarrhea, constipation, dizziness, abdominal pain, skin rash, fevers, chills, night sweats, weight loss, swollen lymph nodes, body aches, joint swelling, muscle aches, chest pain, shortness of breath, mood changes, visual or auditory hallucinations.   Objective:   General: Well Developed, well nourished, and in no acute distress.  Neuro/Psych: Alert and oriented x3, extra-ocular muscles intact, able to move all 4 extremities, sensation grossly intact. Skin: Warm and dry, no rashes noted.  Respiratory: Not using accessory muscles, speaking in full sentences, trachea midline.  Cardiovascular: Pulses palpable, no extremity edema. Abdomen: Does not appear distended. Right Knee: Visibly swollen with a palpable fluid wave and effusion. Range of motion limited by pain. Ligaments with solid consistent endpoints including ACL, PCL, LCL, MCL. Negative Mcmurray's and provocative meniscal tests. Non painful patellar compression. Patellar and quadriceps tendons unremarkable. Hamstring and quadriceps strength is normal.  Procedure: Real-time Ultrasound Guided aspiration/Injection of right knee Device: GE Logiq E  Verbal informed consent obtained.  Time-out conducted.  Noted no overlying erythema,  induration, or other signs of local infection.  Skin prepped in a sterile fashion.  Local anesthesia: Topical Ethyl chloride.  With sterile technique and under real time ultrasound guidance:  10 mL straw-colored fluid aspirated, syringe switched and 1 mL kenalog 40, 2 mL lidocaine, 2 mL Marcaine injected easily. Completed without difficulty  Pain immediately resolved suggesting accurate placement of the medication.  Advised to call if fevers/chills, erythema, induration, drainage, or persistent bleeding.  Images permanently stored and available for review in the ultrasound unit.  Impression: Technically successful ultrasound guided injection.  Impression and Recommendations:   This case required medical decision making of moderate complexity.  Effusion of right knee Suspect meniscal tear, aspiration and injection as above , x-rays, return to see me in one month.  There was still a significant amount of pain after the aspiration and injection, I strapped the knee with compressive dressing and made him partial weightbearing with a single crutch. If he continues to have pain after the next few days we will proceed with MRI.  I spent 25 minutes with this patient, greater than 50% was face-to-face time counseling regarding the above diagnoses, this was separate from the time spent performing the procedure.

## 2016-04-05 NOTE — Assessment & Plan Note (Addendum)
Suspect meniscal tear, aspiration and injection as above , x-rays, return to see me in one month.  There was still a significant amount of pain after the aspiration and injection, I strapped the knee with compressive dressing and made him partial weightbearing with a single crutch. If he continues to have pain after the next few days we will proceed with MRI.

## 2016-07-10 ENCOUNTER — Ambulatory Visit (INDEPENDENT_AMBULATORY_CARE_PROVIDER_SITE_OTHER): Payer: PRIVATE HEALTH INSURANCE | Admitting: Family Medicine

## 2016-07-10 ENCOUNTER — Encounter: Payer: Self-pay | Admitting: Family Medicine

## 2016-07-10 ENCOUNTER — Ambulatory Visit (INDEPENDENT_AMBULATORY_CARE_PROVIDER_SITE_OTHER): Payer: PRIVATE HEALTH INSURANCE

## 2016-07-10 VITALS — BP 121/76 | HR 66 | Wt 199.0 lb

## 2016-07-10 DIAGNOSIS — M79671 Pain in right foot: Secondary | ICD-10-CM

## 2016-07-10 DIAGNOSIS — M7731 Calcaneal spur, right foot: Secondary | ICD-10-CM

## 2016-07-10 DIAGNOSIS — M795 Residual foreign body in soft tissue: Secondary | ICD-10-CM

## 2016-07-10 DIAGNOSIS — S92321S Displaced fracture of second metatarsal bone, right foot, sequela: Secondary | ICD-10-CM | POA: Diagnosis not present

## 2016-07-10 DIAGNOSIS — M216X1 Other acquired deformities of right foot: Secondary | ICD-10-CM | POA: Diagnosis not present

## 2016-07-10 DIAGNOSIS — X58XXXA Exposure to other specified factors, initial encounter: Secondary | ICD-10-CM

## 2016-07-10 DIAGNOSIS — S92351S Displaced fracture of fifth metatarsal bone, right foot, sequela: Secondary | ICD-10-CM | POA: Diagnosis not present

## 2016-07-10 MED ORDER — DICLOFENAC SODIUM 1 % TD GEL: 2.0000 g | Freq: Four times a day (QID) | TRANSDERMAL | 11 refills | Status: DC

## 2016-07-10 NOTE — Patient Instructions (Signed)
Thank you for coming in today. Attend PT.  Use the voltaren gel 4x daily as needed.  Use the ankle sleeve.  Recheck in 1-2 months or sooner if needed.   Peroneal Tendinopathy Peroneal tendinopathy is irritation of the tendons that pass behind your ankle (peroneal tendons). These tendons attach muscles in your foot to a bone on the side of your foot and underneath the arch of your foot. This condition can cause your peroneal tendons to get bigger and swell. What are the causes? This condition may be caused by:  Putting stress on your ankle over and over again (overuse injury).  A sudden injury that puts stress on your tendons, such as an ankle sprain. What increases the risk? This condition is more likely to develop in:  People who have high arches.  Athletes who play sports that involve putting stress on the ankle over and over again. These sports include:  Running.  Dancing.  Soccer.  Basketball. What are the signs or symptoms? Symptoms of this condition can start suddenly or develop gradually. Symptoms include:  Pain in the back of the ankle, on the side of the foot, or in the arch of the foot.  Pain that gets worse with activity and better with rest.  Swelling.  Warmth.  Weakness in your foot or ankle. How is this diagnosed? This condition may be diagnosed based on:  Your symptoms.  Your medical history.  A physical exam.  Imaging tests, such as:  An X-ray or CT scan to check for bone injury.  MRI or ultrasound to check for muscle or tendon injury. During your physical exam, your health care provider may move your foot and ankle and test the strength of your leg muscles. How is this treated? This condition may be treated by:  Keeping your body weight off your ankle for several days.  Returning gradually to full activity gradually.  Putting ice on your ankle to reduce swelling.  Taking an anti-inflammatory pain medicine (NSAID).  Having medicine  injected into your tendon to reduce swelling.  Wearing a removable boot or brace for ankle support.  Doing range-of-motion exercises and strengthening exercises (physical therapy) when pain and swelling improve. If the condition does not improve with treatment, or if a tendon or muscle is damaged, surgery may be needed. Follow these instructions at home: If you have a boot or brace:  Wear it as told by your health care provider. Remove it only as told by your health care provider.  Loosen it if your toes tingle, become numb, or turn cold and blue.  Do not let it get wet if it is not waterproof.  Keep it clean. Managing pain, stiffness, and swelling  If directed, apply ice to the injured area:  Put ice in a plastic bag.  Place a towel between your skin and the bag.  Leave the ice on for 20 minutes, 2-3 times a day.  Take over-the-counter and prescription medicines only as told by your health care provider.  Raise (elevate) your ankle above the level of your heart when resting if you have swelling. Activity  Do not use your ankle to support (bear) your full body weight until your health care provider says that you can.  Do not do activities that make pain or swelling worse.  Return to your normal activities as told by your health care provider. General instructions  Keep all follow-up visits as told by your health care provider. This is important. How is this  prevented?  Wear supportive footwear that is appropriate for your athletic activity.  Avoid athletic activities that cause swelling or pain in your ankle or foot.  See your health care provider if you have pain or swelling that does not improve after a few days of rest.  Stop training if you develop pain or swelling.  If you start a new athletic activity, start gradually to build up your strength, endurance, and flexibility. Contact a health care provider if:  Your symptoms get worse.  Your symptoms do not  improve in 2-4 weeks.  You develop new, unexplained symptoms. This information is not intended to replace advice given to you by your health care provider. Make sure you discuss any questions you have with your health care provider. Document Released: 07/16/2005 Document Revised: 03/20/2016 Document Reviewed: 06/04/2015 Elsevier Interactive Patient Education  2017 [employer]    Peroneal Tendinopathy Rehab Ask your health care provider which exercises are safe for you. Do exercises exactly as told by your health care provider and adjust them as directed. It is normal to feel mild stretching, pulling, tightness, or discomfort as you do these exercises, but you should stop right away if you feel sudden pain or your pain gets worse.Do not begin these exercises until told by your health care provider. Stretching and range of motion exercises These exercises warm up your muscles and joints and improve the movement and flexibility of your ankle. These exercises also help to relieve pain and stiffness. Exercise A: Gastroc and soleus, standing 1. Stand on the edge of a step on the balls of your feet. The ball of your foot is on the walking surface, right under your toes. 2. Hold onto the railing for balance. 3. Slowly lift your left / right foot, allowing your body weight to press your left / right heel down over the edge of the step. You should feel a stretch in your left / right calf. 4. Hold this position for __________ seconds. Repeat __________ times with your left / right knee straight and __________ times with your left / right knee bent. Complete this stretch __________ times per day. Strengthening exercises These exercises improve the strength and endurance of your foot and ankle. Endurance is the ability to use your muscles for a long time, even after they get tired. Exercise B: Dorsiflexors 1. Secure a rubber exercise band or tube to an object, like a table leg, that will not move if it  is pulled on. 2. Secure the other end of the band around your left / right foot. 3. Sit on the floor, facing the object with your left / right foot extended. The band or tube should be slightly tense when your foot is relaxed. 4. Slowly flex your left / right ankle and toes to bring your foot toward you. 5. Hold this position for __________ seconds. 6. Slowly return your foot to the starting position. Repeat __________ times. Complete this exercise __________ times per day. Exercise C: Evertors 1. Sit on the floor with your legs straight out in front of you. 2. Loop a rubber exercise or band or tube around the ball of your left / right foot. The ball of your foot is on the walking surface, right under your toes. 3. Hold the ends of the band in your hands, or secure the band to a stable object. 4. Slowly push your foot outward, away from your other leg. 5. Hold this position for __________ seconds. 6. Slowly return your foot  to the starting position. Repeat __________ times. Complete this exercise __________ times per day. Exercise D: Standing heel raise (plantar flexion) 1. Stand with your feet shoulder-width apart with the balls of your feet on a step. The ball of your foot is on the walking surface, right under your toes. 2. Keep your weight spread evenly over the width of your feet while you rise up on your toes. Use a wall or railing to steady yourself, but try not to use it for support. 3. If this exercise is too easy, try these options:  Shift your weight toward your left / right leg until you feel challenged.  If told by your health care provider, stand on your left / right leg only. 4. Hold this position for __________ seconds. Repeat __________ times. Complete this exercise __________ times per day. Exercise E: Single leg stand 1. Without shoes, stand near a railing or in a doorway. You may hold onto the railing or door frame as needed. 2. Stand on your left / right foot. Keep  your big toe down on the floor and try to keep your arch lifted.  Do not roll to the outside of your foot.  If this exercise is too easy, you can try it with your eyes closed or while standing on a pillow. 3. Hold this position for __________ seconds. Repeat __________ times. Complete this exercise __________ times per day. This information is not intended to replace advice given to you by your health care provider. Make sure you discuss any questions you have with your health care provider. Document Released: 07/16/2005 Document Revised: 03/22/2016 Document Reviewed: 06/04/2015 Elsevier Interactive Patient Education  2017 [employer].

## 2016-07-10 NOTE — Progress Notes (Signed)
Fred Bond is a 65 y.o. male who presents to [facility] today for right lateral foot pain. Patient has a history of right lateral foot pain now for over a year. He thinks he suffered an inversion injury while running about a year ago. He did not have yearly any evaluation. He notes continued moderate foot pain especially worse with prolonged standing and activity. Pain is located at the proximal fifth metatarsal area. He denies any radiating pain weakness or numbness fevers or chills. He feels well otherwise.   Past Medical History:  Diagnosis Date  . Asthma 08/07/2012   Childhood   . History of iron deficiency 08/07/2012  . Recurrent pneumonia 08/07/2012  . Tear of meniscus of left knee 08/07/2012   No past surgical history on file. Social History  Substance Use Topics  . Smoking status: Never Smoker  . Smokeless tobacco: Not on file  . Alcohol use No     ROS:  As above   Medications: Current Outpatient Prescriptions  Medication Sig Dispense Refill  . AMBULATORY NON FORMULARY MEDICATION flinstone vitamin    . aspirin 81 MG tablet Take 81 mg by mouth daily.    Marland Kitchen escitalopram (LEXAPRO) 20 MG tablet Take 1 tablet (20 mg total) by mouth daily. 90 tablet 1  . L-Lysine 500 MG TABS Take by mouth.    . Multiple Vitamins-Minerals (CENTRUM SILVER ULTRA MENS) TABS Take by mouth.    . niacin 500 MG tablet Take 500 mg by mouth daily with breakfast.    . sildenafil (REVATIO) 20 MG tablet 1-4 tabs by mouth as needed for intercourse. 50 tablet 11  . zoster vaccine live, PF, (ZOSTAVAX) [address] UNT/0.65ML injection Inject 19,400 Units into the skin once. 1 each 0  . diclofenac sodium (VOLTAREN) 1 % GEL Apply 2 g topically 4 (four) times daily. To affected joint. 100 g 11   No current facility-administered medications for this visit.    No Known Allergies   Exam:  BP 121/76   Pulse 66   Wt 199 lb (90.3 kg)   BMI 27.75 kg/m  General: Well  Developed, well nourished, and in no acute distress.  Neuro/Psych: Alert and oriented x3, extra-ocular muscles intact, able to move all 4 extremities, sensation grossly intact. Skin: Warm and dry, no rashes noted.  Respiratory: Not using accessory muscles, speaking in full sentences, trachea midline.  Cardiovascular: Pulses palpable, no extremity edema. Abdomen: Does not appear distended. MSK: Right foot slightly swollen appearing proximal fifth metatarsal area otherwise normal. Pulses capillary refill sensation are intact. Tender to palpation overlying the proximal fifth metatarsal and along the insertion of the peroneal brevis tendon under the fifth metatarsal area. Stable ligamentous exam. Pain with resisted foot eversion present.  X-ray right foot: No acute injuries noted. Patient has what appears to be an old fracture or stress fracture at the second metatarsal. At the fifth proximal metatarsal and some degenerative changes and spur formation but no obvious acute injury Awaiting formal radiology review    No results found for this or any previous visit (from the past 48 hour(s)). No results found.    Assessment and Plan: 65 y.o. male with insertional peroneal brevis tendinitis. Treat with ankle compression sleeve physical therapy and diclofenac gel. Additionally we'll use home exercise program and recheck in about 6 weeks or so.    Orders Placed This Encounter  Procedures  . DG Foot Complete Right    Standing Status:   Future  Number of Occurrences:   1    Standing Expiration Date:   09/10/2017    Order Specific Question:   Reason for Exam (SYMPTOM  OR DIAGNOSIS REQUIRED)    Answer:   eval pain rt 5th proximal metatarsal. Pain x 1 year following inversion injury    Order Specific Question:   Preferred imaging location?    [facility]  . Ambulatory referral to Physical Therapy    Referral Priority:   Routine    Referral Type:   Physical Medicine     Referral Reason:   Specialty Services Required    Requested Specialty:   Physical Therapy    Number of Visits Requested:   1    Discussed warning signs or symptoms. Please see discharge instructions. Patient expresses understanding.

## 2016-07-25 ENCOUNTER — Encounter: Payer: Self-pay | Admitting: Physical Therapy

## 2016-07-25 ENCOUNTER — Ambulatory Visit (INDEPENDENT_AMBULATORY_CARE_PROVIDER_SITE_OTHER): Payer: PRIVATE HEALTH INSURANCE | Admitting: Physical Therapy

## 2016-07-25 DIAGNOSIS — M6281 Muscle weakness (generalized): Secondary | ICD-10-CM

## 2016-07-25 DIAGNOSIS — M25671 Stiffness of right ankle, not elsewhere classified: Secondary | ICD-10-CM

## 2016-07-25 DIAGNOSIS — M25571 Pain in right ankle and joints of right foot: Secondary | ICD-10-CM | POA: Diagnosis not present

## 2016-07-25 NOTE — Therapy (Signed)
[facility] [place] [address] [place], [place], [address] Phone: [phone]   Fax:  [phone]  Physical Therapy Evaluation  Patient Details  Name: Fred Bond MRN: [id number] Date of Birth: [birth year] Referring Provider: Dr Clementeen Graham  Encounter Date: 07/25/2016      PT End of Session - 07/25/16 1506    Visit Number 1   Number of Visits 8   Date for PT Re-Evaluation 08/22/16   PT Start Time 1506   PT Stop Time 1555   PT Time Calculation (min) 49 min   Activity Tolerance Patient tolerated treatment well      Past Medical History:  Diagnosis Date  . Asthma 08/07/2012   Childhood   . History of iron deficiency 08/07/2012  . Recurrent pneumonia 08/07/2012  . Tear of meniscus of left knee 08/07/2012    History reviewed. No pertinent surgical history.  There were no vitals filed for this visit.       Subjective Assessment - 07/25/16 1457    Subjective Pt reported to MD Rt ankle pain for about a year now, he thinks he had an inversion injury while running. The out side of the Rt foot has had an increase in pain about 2 months ago.  His work shift at [employer] is 5-8 hrs and the pain keeps him awake at night.    Pertinent History Lt knee scope 2014 for meniscus tear, Rt knee bursitis, having some bilat hip pain due to changes in his walk Lt > Rt.  Needs Lt TKA however is trying to put it off.    How long can you stand comfortably? has pain    How long can you walk comfortably? has pain immediately   Diagnostic tests x-rays old fx 2nd metatarsal,   Patient Stated Goals find out what kind of shoe to wear, stop the pain in the outside of the Rt foot.    Currently in Pain? Yes   Pain Score 4   8/10 after work   Pain Location Foot   Pain Orientation Right;Lateral   Pain Type Chronic pain   Pain Onset More than a month ago   Pain Frequency Constant   Aggravating Factors  walking and standing   Pain Relieving Factors  nothing, tries aleive for his knees, doesn't think it helps.    Effect of Pain on Daily Activities difficulty walking            [facility] PT Assessment - 07/25/16 0001      Assessment   Medical Diagnosis Rt foot pain   Referring Provider Dr Clementeen Graham   Onset Date/Surgical Date 07/26/15   Hand Dominance Right   Next MD Visit end of Jan   Prior Therapy not for the foot     Precautions   Precautions None     Balance Screen   Has the patient fallen in the past 6 months No   Has the patient had a decrease in activity level because of a fear of falling?  No   Is the patient reluctant to leave their home because of a fear of falling?  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      Observation/Other Assessments   Focus on Therapeutic Outcomes (FOTO)  51% limited     Functional Tests   Functional tests Squat;Single leg stance     Posture/Postural Control   Posture/Postural Control Postural limitations   Postural Limitations --  pronation Lt foot, bilat LE valgus   Posture Comments bump on the outside of his feet, Rt > Lt     ROM / Strength   AROM / PROM / Strength AROM;PROM;Strength     AROM   AROM Assessment Site Ankle   Right/Left Ankle Left;Right   Right Ankle Dorsiflexion 8  with lateral foot pain   Right Ankle Plantar Flexion 52   Right Ankle Inversion 30   Right Ankle Eversion 25   Left Ankle Dorsiflexion 10   Left Ankle Plantar Flexion 47   Left Ankle Inversion 28   Left Ankle Eversion 32     PROM   PROM Assessment Site Ankle  great toe extension WNL biilat   Right/Left Ankle Left;Right   Right Ankle  Dorsiflexion 12   Left Ankle Dorsiflexion 12     Strength   Strength Assessment Site Hip;Knee;Ankle   Right/Left Hip --  WNL except Rt hip ext & abduction 5-/5   Right/Left Knee --  bilat WNL   Right/Left Ankle Right  Lt WNL   Right Ankle Dorsiflexion 5/5   Right Ankle Plantar Flexion 4+/5   Right Ankle Inversion 4/5   Right Ankle Eversion 4/5  with pain     Palpation   Palpation comment point tender at the two ends of the Rt 5th metatarsal, large protrubance at the base of the 5ht metatarsal.                    [facility] Adult PT Treatment/Exercise - 07/25/16 0001      Exercises   Exercises Ankle     Modalities   Modalities Iontophoresis     Iontophoresis   Type of Iontophoresis Dexamethasone   Location base of Rt 5th metatarsal   Dose 1.3cc   Time 13 hr patch     Ankle Exercises: Stretches   Gastroc Stretch 30 seconds     Ankle Exercises: Seated   Other Seated Ankle Exercises bilat ankle eversion, red band, 2x10                PT Education - 07/25/16 1540    Education provided Yes   Education Details HEP and iontophoresis   Person(s) Educated Patient   Methods Explanation;Demonstration;Handout   Comprehension Returned demonstration;Verbalized understanding             PT Long Term Goals - 07/25/16 1455      PT LONG TERM GOAL #1   Title I with advanced HEP ( 08/22/16)    Time 4   Period Weeks   Status New     PT LONG TERM GOAL #2   Title improve FOTO =/< 37% limited (08/22/16)     Time 4   Period Days   Status New     PT LONG TERM GOAL #3   Title report =/> 75% reduction in Rt lateral foot pain with daily activity ( 08/22/16)    Time 4   Period Weeks   Status New     PT LONG TERM GOAL #4   Title demo Rt ankle eversion/inversion strength 5/5 ( 08/22/16)    Time 4  Period Weeks   Status New     PT LONG TERM GOAL #5   Title demo bilat DF =/> 18 degrees ( 08/22/16)    Time 4   Period Weeks   Status New                Plan - 07/25/16 1456    Clinical Impression Statement 65 yo male presents with lateral Rt foot pain that is getting progressively worse over the last couple of months.  He did come out of retirement and is working with 5-8 hr shifts of being on his feet.  He is going to get new, supportive sneakers.  He has some weakness in bilat ankles, stiffness into dorsiflexion and a large tender protrusion at the base of the 5th metatarsal.  On x-ray it looks like the bone has possibly shifting out to the side.    Rehab Potential Excellent   PT Frequency 2x / week   PT Duration 4 weeks   PT Treatment/Interventions Moist Heat;Ultrasound;Therapeutic exercise;Dry needling;Taping;Vasopneumatic Device;Manual techniques;Neuromuscular re-education;Cryotherapy;Gait training;Electrical Stimulation;Iontophoresis 4mg /ml Dexamethasone;Patient/family education   PT Next Visit Plan assess response to ionto, US, ankle work in sitting to start. Possibly taping to pull in 5th metatarsal.    Consulted and Agree with Plan of Care Patient      Patient will benefit from skilled therapeutic intervention in order to improve the following deficits and impairments:  Pain, Decreased strength, Decreased range of motion, Abnormal gait  Visit Diagnosis: Pain in right ankle and joints of right foot - Plan: PT plan of care cert/re-cert  Muscle weakness (generalized) - Plan: PT plan of care cert/re-cert  Stiffness of right ankle, not elsewhere classified - Plan: PT plan of care cert/re-cert     Problem List Patient Active Problem List   Diagnosis Date Noted  . Effusion of right knee 04/05/2016  . Stress at home 03/26/2016  . Olecranon bursitis of right elbow 12/23/2015  . Chest pain 06/02/2015  . Senile purpura (HCC) 02/18/2014  . 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 Hyperlipidemia LDL goal < 130 08/11/2012  . History of iron deficiency 08/07/2012  . Recurrent pneumonia 08/07/2012  . Tear  of meniscus of left knee 08/07/2012  . Asthma 08/07/2012    Roderic ScarceSusan Shaver PT 07/25/2016, 4:29 PM  [facility] [address] [place], [place], [address] Phone: [phone]   Fax:  [phone]  Name: [name] MRN: [id number] Date of Birth: 1950/08/26

## 2016-07-25 NOTE — Patient Instructions (Addendum)
ANKLE: Eversion, Bilateral (Band)    Place band around feet. Keeping heels on floor, raise toes of both feet up and away from body. Do not move hips. Hold _1__ seconds. Use ___red_____ band. _10__ reps per set, _2__ sets per day, _6-7__ days per week.  Gastroc Stretch    Stand with right foot back, leg straight, forward leg bent. Keeping heel on floor, turned slightly out, lean into wall until stretch is felt in calf. Hold __30__ seconds. Repeat __1 times per set. Do _1___ sets per session. Do __1__ sessions per day. Do on each side. Copyright  VHI. All rights reserved.    IONTOPHORESIS PATIENT PRECAUTIONS & CONTRAINDICATIONS:  . Redness under one or both electrodes can occur.  This characterized by a uniform redness that usually disappears within 12 hours of treatment. . Small pinhead size blisters may result in response to the drug.  Contact your physician if the problem persists more than 24 hours. . On rare occasions, iontophoresis therapy can result in temporary skin reactions such as rash, inflammation, irritation or burns.  The skin reactions may be the result of individual sensitivity to the ionic solution used, the condition of the skin at the start of treatment, reaction to the materials in the electrodes, allergies or sensitivity to dexamethasone, or a poor connection between the patch and your skin.  Discontinue using iontophoresis if you have any of these reactions and report to your therapist. . Remove the Patch or electrodes if you have any undue sensation of pain or burning during the treatment and report discomfort to your therapist. . Tell your Therapist if you have had known adverse reactions to the application of electrical current. . If using the Patch, the LED light will turn off when treatment is complete and the patch can be removed.  Approximate treatment time is 1-3 hours.  Remove the patch when light goes off or after 6 hours. . The Patch can be worn during  normal activity, however excessive motion where the electrodes have been placed can cause poor contact between the skin and the electrode or uneven electrical current resulting in greater risk of skin irritation. Marland Kitchen. Keep out of the reach of children.   . DO NOT use if you have a cardiac pacemaker or any other electrically sensitive implanted device. . DO NOT use if you have a known sensitivity to dexamethasone. . DO NOT use during Magnetic Resonance Imaging (MRI). . DO NOT use over broken or compromised skin (e.g. sunburn, cuts, or acne) due to the increased risk of skin reaction. . DO NOT SHAVE over the area to be treated:  To establish good contact between the Patch and the skin, excessive hair may be clipped. . DO NOT place the Patch or electrodes on or over your eyes, directly over your heart, or brain. . DO NOT reuse the Patch or electrodes as this may cause burns to occur.

## 2016-07-31 ENCOUNTER — Encounter: Payer: Self-pay | Admitting: Physical Therapy

## 2016-08-06 ENCOUNTER — Ambulatory Visit (INDEPENDENT_AMBULATORY_CARE_PROVIDER_SITE_OTHER): Payer: PRIVATE HEALTH INSURANCE | Admitting: Physical Therapy

## 2016-08-06 DIAGNOSIS — M6281 Muscle weakness (generalized): Secondary | ICD-10-CM

## 2016-08-06 DIAGNOSIS — M25571 Pain in right ankle and joints of right foot: Secondary | ICD-10-CM | POA: Diagnosis not present

## 2016-08-06 DIAGNOSIS — M25671 Stiffness of right ankle, not elsewhere classified: Secondary | ICD-10-CM

## 2016-08-06 NOTE — Therapy (Signed)
[place], [place], [address] Phone: [phone]   Fax:  [phone]  Physical Therapy Treatment  Patient Details  Name: Fred Bond MRN: [id number] Date of Birth: 1951-01-01 Referring Provider: Dr. Lynne Leader  Encounter Date: 08/06/2016      PT End of Session - 08/06/16 1107    Visit Number 2   Number of Visits 8   Date for PT Re-Evaluation 08/22/16   PT Start Time 1104   PT Stop Time 1142   PT Time Calculation (min) 38 min   Activity Tolerance Patient tolerated treatment well   Behavior During Therapy [facility] for tasks assessed/performed      Past Medical History:  Diagnosis Date  . Asthma 08/07/2012   Childhood   . History of iron deficiency 08/07/2012  . Recurrent pneumonia 08/07/2012  . Tear of meniscus of left knee 08/07/2012    No past surgical history on file.  There were no vitals filed for this visit.      Subjective Assessment - 08/06/16 1107    Subjective Pt reports he has not been compliant with HEP.  He noticed the pain in Rt ankle has decreased over last 4 days.  He had no skin reaction from patch, is unsure if it helped but wil try again.     Currently in Pain? Yes   Pain Score 3   6/10 at end of work.   Pain Location Foot   Pain Orientation Right;Lateral            [facility] PT Assessment - 08/06/16 0001      Assessment   Medical Diagnosis Rt foot pain   Referring Provider Dr. Lynne Leader   Onset Date/Surgical Date 07/26/15   Hand Dominance Right   Next MD Visit end of Jan   Prior Therapy not for the foot     AROM   AROM Assessment Site Ankle   Right/Left Ankle Right   Right Ankle Dorsiflexion 10   Right Ankle Plantar Flexion 48     Strength   Right Ankle Dorsiflexion 5/5   Right Ankle Inversion 4+/5  with pain   Right Ankle Eversion 4+/5  with pain     Palpation   Palpation comment point tender at the two ends of the Rt 5th metatarsal, large protrubance  at the base of the 5th metatarsal.           [facility] Adult PT Treatment/Exercise - 08/06/16 0001      Modalities   Modalities Ultrasound;Iontophoresis     Ultrasound   Ultrasound Location Rt 5th metatarsal head   Ultrasound Parameters 50%, 1.0 w/cm2, 3.3 mHz, 8 min    Ultrasound Goals Pain;Edema     Iontophoresis   Type of Iontophoresis Dexamethasone   Location base of Rt 5th metatarsal   Dose 1.0 cc   Time 8-12  hr patch (120 mV)     Manual Therapy   Manual Therapy Soft tissue mobilization   Manual therapy comments gentle mob's between Rt rays.    Soft tissue mobilization to Rt plantar surface of foot around 5th metatarsal head and plantar fascia.       Ankle Exercises: Stretches   Gastroc Stretch 2 reps;60 seconds     Ankle Exercises: Seated   Ankle Circles/Pumps Right;20 reps   BAPS Sitting;Level 3;10 reps  eversion/inversion. unable to do CW/CCW.    Other Seated Ankle Exercises bilat ankle eversion, red band, 2x10  PT Long Term Goals - 08/06/16 1429      PT LONG TERM GOAL #1   Title I with advanced HEP ( 08/22/16)    Time 4   Period Weeks   Status On-going     PT LONG TERM GOAL #2   Title improve FOTO =/< 37% limited (08/22/16)     Time 4   Period Days   Status On-going     PT LONG TERM GOAL #3   Title report =/> 75% reduction in Rt lateral foot pain with daily activity ( 08/22/16)    Time 4   Period Weeks   Status On-going     PT LONG TERM GOAL #4   Title demo Rt ankle eversion/inversion strength 5/5 ( 08/22/16)    Time 4   Period Weeks   Status On-going     PT LONG TERM GOAL #5   Title demo bilat DF =/> 18 degrees ( 08/22/16)    Time 4   Period Weeks   Status On-going               Plan - 08/06/16 1143    Clinical Impression Statement Pt had positive response to last ionto treatment; repeated today to same area.  Pt demo slight improvement in Rt ankle DF ROM and strength.  Encouraged compliance with HEP to  assist in meeting established goals.  No goals met yet; only 2nd visit.    Rehab Potential Excellent   PT Frequency 2x / week   PT Duration 4 weeks   PT Treatment/Interventions Moist Heat;Ultrasound;Therapeutic exercise;Dry needling;Taping;Vasopneumatic Device;Manual techniques;Neuromuscular re-education;Cryotherapy;Gait training;Electrical Stimulation;Iontophoresis 69m/ml Dexamethasone;Patient/family education   PT Next Visit Plan assess response to 2nd ionto and [place] ankle work in sitting to start.   Consulted and Agree with Plan of Care Patient      Patient will benefit from skilled therapeutic intervention in order to improve the following deficits and impairments:  Pain, Decreased strength, Decreased range of motion, Abnormal gait  Visit Diagnosis: Pain in right ankle and joints of right foot  Muscle weakness (generalized)  Stiffness of right ankle, not elsewhere classified     Problem List Patient Active Problem List   Diagnosis Date Noted  . Effusion of right knee 04/05/2016  . Stress at home 03/26/2016  . Olecranon bursitis of right elbow 12/23/2015  . Chest pain 06/02/2015  . [place] 02/18/2014  . 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 Hyperlipidemia LDL goal < 130 08/11/2012  . History of iron deficiency 08/07/2012  . Recurrent pneumonia 08/07/2012  . Tear of meniscus of left knee 08/07/2012  . Asthma 08/07/2012   JKerin Perna PTA 08/06/16 2:33 PM  [id number] [address]Phone: [phone]  Fax:  [phone] Name: Fred PensonMRN: [id number] of Birth: [birth year]

## 2016-08-10 ENCOUNTER — Ambulatory Visit (INDEPENDENT_AMBULATORY_CARE_PROVIDER_SITE_OTHER): Payer: PRIVATE HEALTH INSURANCE | Admitting: Physical Therapy

## 2016-08-10 DIAGNOSIS — M25671 Stiffness of right ankle, not elsewhere classified: Secondary | ICD-10-CM

## 2016-08-10 DIAGNOSIS — M6281 Muscle weakness (generalized): Secondary | ICD-10-CM | POA: Diagnosis not present

## 2016-08-10 DIAGNOSIS — M25571 Pain in right ankle and joints of right foot: Secondary | ICD-10-CM

## 2016-08-10 NOTE — Therapy (Signed)
[facility] [address] [place], [place], [address] Phone: [phone]   Fax:  [phone]  Physical Therapy Treatment  Patient Details  Name: Fred Bond MRN: [id number] Date of Birth: 1951/05/19 Referring Provider: Dr. Clementeen GrahamEvan Corey  Encounter Date: 08/10/2016      PT End of Session - 08/10/16 0857    Visit Number 3   Number of Visits 8   Date for PT Re-Evaluation 08/22/16   PT Start Time 0846   PT Stop Time 0931   PT Time Calculation (min) 45 min      Past Medical History:  Diagnosis Date  . Asthma 08/07/2012   Childhood   . History of iron deficiency 08/07/2012  . Recurrent pneumonia 08/07/2012  . Tear of meniscus of left knee 08/07/2012    No past surgical history on file.  There were no vitals filed for this visit.      Subjective Assessment - 08/10/16 0849    Subjective Olga states his foot felt "pretty good" the day following last session.  But he returned to work, pain returned (5-8/10). He continues to wear his compression sleeve and doing HEP each day.  He has not been using Voltaren gel or icing.    Patient Stated Goals find out what kind of shoe to wear, stop the pain in the outside of the Rt foot.    Currently in Pain? Yes   Pain Score 3   8/10 at end of work shift.    Pain Location Foot   Pain Orientation Right   Pain Descriptors / Indicators Sharp   Aggravating Factors  prolonged walking/ standing   Pain Relieving Factors rest, holding foot in certain position.             [facility] PT Assessment - 08/10/16 0001      AROM   AROM Assessment Site Ankle   Right/Left Ankle Right   Right Ankle Dorsiflexion 12                     [facility] Adult PT Treatment/Exercise - 08/10/16 0001      Ultrasound   Ultrasound Location Rt 5th metatarsal head region    Ultrasound Parameters 50%, 1.1 w/cm2, 3.3 mHz, 8 min     Ultrasound Goals Edema;Pain     Iontophoresis   Type of  Iontophoresis Dexamethasone   Location base of Rt 5th metatarsal   Dose 1.0 cc   Time 8-12  hr patch (120 mV)     Manual Therapy   Manual therapy comments gentle mob's between Rt rays.  STM to Rt fibularis muscle group (cross fiber and muscle stripping entire length of muscle)      Ankle Exercises: Stretches   Soleus Stretch 2 reps;30 seconds  Rt and Lt   Gastroc Stretch 3 reps  45 sec     Ankle Exercises: Seated   Ankle Circles/Pumps Right;20 reps     Ankle Exercises: Supine   T-Band Rt ankle eversion, then DF with yellow band x 12 reps.      Ankle Exercises: Aerobic   Stationary Bike NuStep L5: 6 min                      PT Long Term Goals - 08/06/16 1429      PT LONG TERM GOAL #1   Title I with advanced HEP ( 08/22/16)    Time 4   Period Weeks   Status On-going  PT LONG TERM GOAL #2   Title improve FOTO =/< 37% limited (08/22/16)     Time 4   Period Days   Status On-going     PT LONG TERM GOAL #3   Title report =/> 75% reduction in Rt lateral foot pain with daily activity ( 08/22/16)    Time 4   Period Weeks   Status On-going     PT LONG TERM GOAL #4   Title demo Rt ankle eversion/inversion strength 5/5 ( 08/22/16)    Time 4   Period Weeks   Status On-going     PT LONG TERM GOAL #5   Title demo bilat DF =/> 18 degrees ( 08/22/16)    Time 4   Period Weeks   Status On-going               Plan - 08/10/16 1052    Clinical Impression Statement Pt demonstrated improved Rt ankle DF.  Pt encouraged to comply with HEP (including ice, compression, elevation, ROM) to assist with pain reduction and progress towards goals.    Rehab Potential Excellent   PT Frequency 2x / week   PT Duration 4 weeks   PT Treatment/Interventions Moist Heat;Ultrasound;Therapeutic exercise;Dry needling;Taping;Vasopneumatic Device;Manual techniques;Neuromuscular re-education;Cryotherapy;Gait training;Electrical Stimulation;Iontophoresis 4mg /ml  Dexamethasone;Patient/family education   PT Next Visit Plan continue progressive ROM/strengthening to Rt ankle.  Modalities as indicated.    Consulted and Agree with Plan of Care Patient      Patient will benefit from skilled therapeutic intervention in order to improve the following deficits and impairments:  Pain, Decreased strength, Decreased range of motion, Abnormal gait  Visit Diagnosis: Pain in right ankle and joints of right foot  Muscle weakness (generalized)  Stiffness of right ankle, not elsewhere classified     Problem List Patient Active Problem List   Diagnosis Date Noted  . Effusion of right knee 04/05/2016  . Stress at home 03/26/2016  . Olecranon bursitis of right elbow 12/23/2015  . Chest pain 06/02/2015  . Senile purpura (HCC) 02/18/2014  . 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 Hyperlipidemia LDL goal < 130 08/11/2012  . History of iron deficiency 08/07/2012  . Recurrent pneumonia 08/07/2012  . Tear of meniscus of left knee 08/07/2012  . Asthma 08/07/2012   Mayer Camel, PTA 08/10/16 11:08 AM  [facility] [address] [place] [address] [place], [place], [address] Phone: [phone]   Fax:  [phone]  Name: Fred Bond MRN: [id number] Date of Birth: July 16, 1951

## 2016-08-13 ENCOUNTER — Ambulatory Visit (INDEPENDENT_AMBULATORY_CARE_PROVIDER_SITE_OTHER): Payer: PRIVATE HEALTH INSURANCE | Admitting: Physical Therapy

## 2016-08-13 DIAGNOSIS — M25571 Pain in right ankle and joints of right foot: Secondary | ICD-10-CM | POA: Diagnosis not present

## 2016-08-13 DIAGNOSIS — M6281 Muscle weakness (generalized): Secondary | ICD-10-CM | POA: Diagnosis not present

## 2016-08-13 DIAGNOSIS — M25671 Stiffness of right ankle, not elsewhere classified: Secondary | ICD-10-CM

## 2016-08-13 NOTE — Therapy (Signed)
[facility] [address] [place] [address] [place], [place], [address] Phone: [phone]   Fax:  [phone]  Physical Therapy Treatment  Patient Details  Name: Fred Bond MRN: [id number] Date of Birth: 1951/05/14 Referring Provider: Dr. Denyse Amass   Encounter Date: 08/13/2016      PT End of Session - 08/13/16 0849    Visit Number 4   Number of Visits 8   Date for PT Re-Evaluation 08/22/16   PT Start Time 0845   PT Stop Time 0942  last 10 min Cold pack   PT Time Calculation (min) 57 min      Past Medical History:  Diagnosis Date  . Asthma 08/07/2012   Childhood   . History of iron deficiency 08/07/2012  . Recurrent pneumonia 08/07/2012  . Tear of meniscus of left knee 08/07/2012    No past surgical history on file.  There were no vitals filed for this visit.      Subjective Assessment - 08/13/16 0850    Subjective Pt reports his Rt foot pain is improving.  He elevated his foot one day after work and he felt this helped.     Currently in Pain? Yes   Pain Score 2   up to 7/10 at end of work day   Pain Location Foot   Pain Orientation Right;Lateral   Pain Descriptors / Indicators Sharp   Aggravating Factors  prolonged walking/ standing    Pain Relieving Factors rest, holding foot in certain positions.              [facility] PT Assessment - 08/13/16 0001      Assessment   Medical Diagnosis Rt foot pain   Referring Provider Dr. Denyse Amass    Onset Date/Surgical Date 07/26/15   Hand Dominance Right   Next MD Visit 08/21/16     AROM   Right Ankle Dorsiflexion 12   Right Ankle Plantar Flexion 48   Right Ankle Inversion 28   Right Ankle Eversion 30                     [facility] Adult PT Treatment/Exercise - 08/13/16 0001      Modalities   Modalities Cryotherapy     Cryotherapy   Number Minutes Cryotherapy 10 Minutes   Cryotherapy Location Ankle  Rt   Type of Cryotherapy Ice pack     Ultrasound   Ultrasound  Location Rt 5th metatarsal region    Ultrasound Parameters 50%, 1.1 w/cm2, 8 min    Ultrasound Goals Edema;Pain     Iontophoresis   Type of Iontophoresis Dexamethasone   Location base of Rt 5th metatarsal   Dose 1.0 cc   Time 8-12  hr patch (120 mV)     Manual Therapy   Manual therapy comments gentle mob's between Rt rays.  STM to Rt fibularis muscle group (cross fiber and muscle stripping entire length of muscle)      Ankle Exercises: Stretches   Gastroc Stretch 2 reps  45 sec each foot.    Other Stretch Prostretch x 45 sec x 1 reps each ankle.      Ankle Exercises: Seated   Ankle Circles/Pumps Right;20 reps   Other Seated Ankle Exercises inversion/eversion, PF/DF on 1/2 foam roll x 10 reps each direction.      Ankle Exercises: Supine   T-Band Rt ankle eversion, inversion, then DF with yellow band x 12 reps.      Ankle Exercises: Aerobic   Stationary  Bike NuStep L4: 4 min                      PT Long Term Goals - 08/06/16 1429      PT LONG TERM GOAL #1   Title I with advanced HEP ( 08/22/16)    Time 4   Period Weeks   Status On-going     PT LONG TERM GOAL #2   Title improve FOTO =/< 37% limited (08/22/16)     Time 4   Period Days   Status On-going     PT LONG TERM GOAL #3   Title report =/> 75% reduction in Rt lateral foot pain with daily activity ( 08/22/16)    Time 4   Period Weeks   Status On-going     PT LONG TERM GOAL #4   Title demo Rt ankle eversion/inversion strength 5/5 ( 08/22/16)    Time 4   Period Weeks   Status On-going     PT LONG TERM GOAL #5   Title demo bilat DF =/> 18 degrees ( 08/22/16)    Time 4   Period Weeks   Status On-going               Plan - 08/13/16 1224    Clinical Impression Statement Pt reporting slight reduction in Rt foot pain, despite reported non-compliance with HEP.  He reported increased pain with resistance to Rt ankle inversion/ DF; reduced with rest and US.  Pt gradually progressing towards goals.     Rehab Potential Excellent   PT Frequency 2x / week   PT Duration 4 weeks   PT Treatment/Interventions Moist Heat;Ultrasound;Therapeutic exercise;Dry needling;Taping;Vasopneumatic Device;Manual techniques;Neuromuscular re-education;Cryotherapy;Gait training;Electrical Stimulation;Iontophoresis 4mg /ml Dexamethasone;Patient/family education   PT Next Visit Plan continue progressive ROM/strengthening to Rt ankle.  Modalities as indicated.    Consulted and Agree with Plan of Care Patient      Patient will benefit from skilled therapeutic intervention in order to improve the following deficits and impairments:  Pain, Decreased strength, Decreased range of motion, Abnormal gait  Visit Diagnosis: Pain in right ankle and joints of right foot  Muscle weakness (generalized)  Stiffness of right ankle, not elsewhere classified     Problem List Patient Active Problem List   Diagnosis Date Noted  . Effusion of right knee 04/05/2016  . Stress at home 03/26/2016  . Olecranon bursitis of right elbow 12/23/2015  . Chest pain 06/02/2015  . Senile purpura (HCC) 02/18/2014  . 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 Hyperlipidemia LDL goal < 130 08/11/2012  . History of iron deficiency 08/07/2012  . Recurrent pneumonia 08/07/2012  . Tear of meniscus of left knee 08/07/2012  . Asthma 08/07/2012   Mayer CamelJennifer Carlson-Long, PTA 08/13/16 12:26 PM  [facility] [address] [place] [address] [place], [place], [address] Phone: [phone]   Fax:  [phone]  Name: [name] MRN: [id number] Date of Birth: 1950/08/14

## 2016-08-17 ENCOUNTER — Ambulatory Visit (INDEPENDENT_AMBULATORY_CARE_PROVIDER_SITE_OTHER): Payer: PRIVATE HEALTH INSURANCE | Admitting: Physical Therapy

## 2016-08-17 DIAGNOSIS — M6281 Muscle weakness (generalized): Secondary | ICD-10-CM

## 2016-08-17 DIAGNOSIS — M25571 Pain in right ankle and joints of right foot: Secondary | ICD-10-CM

## 2016-08-17 DIAGNOSIS — M25671 Stiffness of right ankle, not elsewhere classified: Secondary | ICD-10-CM

## 2016-08-17 NOTE — Therapy (Signed)
[facility] [address] [place] [address] [place], [place], [address] Phone: [phone]   Fax:  [phone]  Physical Therapy Treatment  Patient Details  Name: Fred Bond MRN: [id number] Date of Birth: 07/26/1951 Referring Provider: Dr. Corey   Encounter Date: 08/17/2016      PT End of Session - 08/17/16 0848    Visit Number 5   Number of Visits 8   Date for PT Re-Evaluation 08/22/16   PT Start Time 0843   PT Stop Time 0930   PT Time Calculation (min) 47 min   Activity Tolerance Patient tolerated treatment well   Behavior During Therapy WFL for tasks assessed/performed      Past Medical History:  Diagnosis Date  . Asthma 08/07/2012   Childhood   . History of iron deficiency 08/07/2012  . Recurrent pneumonia 08/07/2012  . Tear of meniscus of left knee 08/07/2012    No past surgical history on file.  There were no vitals filed for this visit.      Subjective Assessment - 08/17/16 0848    Subjective Pt reports his Rt foot pain during day has improved by 50% since starting therapy. He continues to have significant pain in evening after prolonged standing at work (5+ hr).  He denies completing HEP, nor using ice, elevation, or cream prescribed by MD.  He feels the tape applied to Rt foot helped reduce some of his pain during day;would like to be retaped.    Currently in Pain? Yes   Pain Score 3    Pain Orientation Right            [facility] PT Assessment - 08/17/16 0001      Assessment   Medical Diagnosis Rt foot pain   Referring Provider Dr. Corey    Onset Date/Surgical Date 07/26/15   Hand Dominance Right   Next MD Visit 08/21/16   Prior Therapy not for the foot     AROM   Right Ankle Dorsiflexion 18     Palpation   Palpation comment point tender at the two ends of the Rt 5th metatarsal, large protrubance at the base of the 5th metatarsal.            [facility] Adult PT Treatment/Exercise - 08/17/16 0001       Ultrasound   Ultrasound Location 5th metatarsal region on Rt foot   Ultrasound Parameters 50%, 1.2 w/cm2, 8 min    Ultrasound Goals Edema;Pain     Iontophoresis   Type of Iontophoresis Dexamethasone   Location base of Rt 5th metatarsal   Dose 1.0 cc   Time 6 hr patch (80mA)     Manual Therapy   Manual Therapy Taping   Soft tissue mobilization to Rt fibularis muscle group (prox to distal); some adhesions noted just prox to lateral malleoli.    Kinesiotex Facilitate Muscle     Kinesiotix   Facilitate Muscle  Dynamic tape applied to dorsum of Rt foot to give support/ reduce pain.   dynamic tape applied with 15% stretch to lateral Rt ankle.      Ankle Exercises: Seated   Ankle Circles/Pumps Right;20 reps     Ankle Exercises: Supine   T-Band Rt ankle eversion, inversion, then DF with yellow band x 12 reps.      Ankle Exercises: Aerobic   Stationary Bike NuStep L4: 4 min      Ankle Exercises: Stretches   Soleus Stretch 2 reps;30 seconds  Rt and Lt   Gastroc   Stretch 2 reps  45 sec each foot.                      PT Long Term Goals - 08/17/16 0940      PT LONG TERM GOAL #1   Title I with advanced HEP ( 08/22/16)    Time 4   Period Weeks   Status On-going     PT LONG TERM GOAL #2   Title improve FOTO =/< 37% limited (08/22/16)     Time 4   Period Days   Status On-going     PT LONG TERM GOAL #3   Title report =/> 75% reduction in Rt lateral foot pain with daily activity ( 08/22/16)    Time 4   Period Weeks   Status On-going  improving     PT LONG TERM GOAL #4   Title demo Rt ankle eversion/inversion strength 5/5 ( 08/22/16)    Time 4   Period Weeks   Status On-going     PT LONG TERM GOAL #5   Title demo bilat DF =/> 18 degrees ( 08/22/16)    Time 4   Period Weeks   Status Partially Met  LLE not tested.                Plan - 08/17/16 1309    Clinical Impression Statement Pt reporting 50% improvement in Rt foot pain. He has been  non-compliant with other recommended exercises and suggestions (ie: ice, elevate).  He did demonstrate improved Rt ankle DF; has partially met LTG # 3.   Pt had less pain with use of dynamic tape supporting lateral ankle and dorsum of foot; reapplied today.     Rehab Potential Excellent   PT Frequency 2x / week   PT Duration 4 weeks   PT Treatment/Interventions Moist Heat;Ultrasound;Therapeutic exercise;Dry needling;Taping;Vasopneumatic Device;Manual techniques;Neuromuscular re-education;Cryotherapy;Gait training;Electrical Stimulation;Iontophoresis 4mg/ml Dexamethasone;Patient/family education   PT Next Visit Plan continue progressive ROM/strengthening to Rt ankle.  Modalities as indicated. Measure Lt ankle ROM.    Consulted and Agree with Plan of Care Patient      Patient will benefit from skilled therapeutic intervention in order to improve the following deficits and impairments:  Pain, Decreased strength, Decreased range of motion, Abnormal gait  Visit Diagnosis: Pain in right ankle and joints of right foot  Muscle weakness (generalized)  Stiffness of right ankle, not elsewhere classified     Problem List Patient Active Problem List   Diagnosis Date Noted  . Effusion of right knee 04/05/2016  . Stress at home 03/26/2016  . Olecranon bursitis of right elbow 12/23/2015  . Chest pain 06/02/2015  . Senile purpura (HCC) 02/18/2014  . 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 Hyperlipidemia LDL goal < 130 08/11/2012  . History of iron deficiency 08/07/2012  . Recurrent pneumonia 08/07/2012  . Tear of meniscus of left knee 08/07/2012  . Asthma 08/07/2012   Jennifer Carlson-Long, PTA 08/17/16 1:13 PM  [facility] [address] [place] [address] [place], , [address] Phone: [phone]   Fax:  [phone]  Name: Fred Bond MRN: [id number] Date of Birth: 05/25/1951   

## 2016-08-20 ENCOUNTER — Ambulatory Visit (INDEPENDENT_AMBULATORY_CARE_PROVIDER_SITE_OTHER): Payer: PRIVATE HEALTH INSURANCE | Admitting: Physical Therapy

## 2016-08-20 ENCOUNTER — Encounter: Payer: Self-pay | Admitting: Physical Therapy

## 2016-08-20 DIAGNOSIS — M6281 Muscle weakness (generalized): Secondary | ICD-10-CM

## 2016-08-20 DIAGNOSIS — M25671 Stiffness of right ankle, not elsewhere classified: Secondary | ICD-10-CM | POA: Diagnosis not present

## 2016-08-20 DIAGNOSIS — M25571 Pain in right ankle and joints of right foot: Secondary | ICD-10-CM

## 2016-08-20 NOTE — Therapy (Addendum)
[facility] [address] [place], [place], [address] Phone: [phone]   Fax:  [phone]  Physical Therapy Treatment  Patient Details  Name: Fred Bond MRN: [id number] Date of Birth: 10/23/1950 Referring Provider: Dr. Corey   Encounter Date: 08/20/2016      PT End of Session - 08/20/16 1559    Visit Number 6   Number of Visits 8   Date for PT Re-Evaluation 08/22/16   PT Start Time 1559   PT Stop Time 1647   PT Time Calculation (min) 48 min   Activity Tolerance Patient tolerated treatment well      Past Medical History:  Diagnosis Date  . Asthma 08/07/2012   Childhood   . History of iron deficiency 08/07/2012  . Recurrent pneumonia 08/07/2012  . Tear of meniscus of left knee 08/07/2012    History reviewed. No pertinent surgical history.  There were no vitals filed for this visit.      Subjective Assessment - 08/20/16 1603    Subjective Fred Bond reports his foot feels decent until he works then it is sore.  He turned the ankle on Sunday, no swelling. He is going back to the doctor this week, he acknowledge that he has not been doing all we have recommended.    Currently in Pain? Yes   Pain Score 5    Pain Location Foot   Pain Orientation Right;Lateral   Pain Descriptors / Indicators Sharp   Pain Type Chronic pain   Pain Onset More than a month ago   Pain Frequency Constant  intensity varies, can go down to 2/10   Aggravating Factors  prolonged standing    Pain Relieving Factors resting, patches and tape.                          [facility] Adult PT Treatment/Exercise - 08/20/16 0001      Ultrasound   Ultrasound Location 5th metatarsal   Ultrasound Parameters 50%, 1.0w/cm2, 3.3mHz   Ultrasound Goals Pain;Edema     Iontophoresis   Type of Iontophoresis Dexamethasone   Location base of Rt 5th metatarsal   Dose 1.3cc   Time 13 hr spider patch     Manual Therapy   Manual Therapy  Taping;Joint mobilization;Soft tissue mobilization   Joint Mobilization Rt 5th ray - patient had pain with mobilzations down ( to the plantar surface)    Soft tissue mobilization dorsum of foot IASTM   Kinesiotex Facilitate Muscle     Kinesiotix   Facilitate Muscle  Dynamic tape applied to dorsum of Rt foot to give support/ reduce pain.                      PT Long Term Goals - 08/20/16 1652      PT LONG TERM GOAL #1   Title I with advanced HEP ( 08/22/16)    Status On-going     PT LONG TERM GOAL #2   Title improve FOTO =/< 37% limited (08/22/16)     Status On-going  scored 40% limited     PT LONG TERM GOAL #3   Title report =/> 75% reduction in Rt lateral foot pain with daily activity ( 08/22/16)    Status On-going  reports 50% improvement     PT LONG TERM GOAL #4   Title demo Rt ankle eversion/inversion strength 5/5 ( 08/22/16)    Status On-going     PT LONG   TERM GOAL #5   Title demo bilat DF =/> 18 degrees ( 08/22/16)    Status Partially Met               Plan - 08/20/16 1649    Clinical Impression Statement Pt has good response to treatment however the relief is not long lasting.  He has a lot of pain/sensitivity today with 5th ray mobilizations and adhesions on the dorsum of his foot. Goals not met.  He is going to follow up with MD and see what next steps are.    Rehab Potential Excellent   PT Frequency 2x / week   PT Duration 4 weeks   PT Treatment/Interventions Moist Heat;Ultrasound;Therapeutic exercise;Dry needling;Taping;Vasopneumatic Device;Manual techniques;Neuromuscular re-education;Cryotherapy;Gait training;Electrical Stimulation;Iontophoresis 46m/ml Dexamethasone;Patient/family education   PT Next Visit Plan await to see what MD recommends, will hold chart open for now.    Consulted and Agree with Plan of Care Patient      Patient will benefit from skilled therapeutic intervention in order to improve the following deficits and impairments:   Pain, Decreased strength, Decreased range of motion, Abnormal gait  Visit Diagnosis: Stiffness of right ankle, not elsewhere classified  Muscle weakness (generalized)  Pain in right ankle and joints of right foot     Problem List Patient Active Problem List   Diagnosis Date Noted  . Effusion of right knee 04/05/2016  . Stress at home 03/26/2016  . Olecranon bursitis of right elbow 12/23/2015  . Chest pain 06/02/2015  . Senile purpura ([facility] 02/18/2014  . 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 Hyperlipidemia LDL goal < 130 08/11/2012  . History of iron deficiency 08/07/2012  . Recurrent pneumonia 08/07/2012  . Tear of meniscus of left knee 08/07/2012  . Asthma 08/07/2012    SJeral PinchPT 08/20/2016, 4:54 PM  [facility] [address]Phone: [phone]  Fax:  [phone] Name: CNorfleet CapersMRN: [id number] of Birth: [birth year] PHYSICAL THERAPY DISCHARGE SUMMARY  Visits from Start of Care: 6  Current functional level related to goals / functional outcomes: See above for last treatment info   Remaining deficits: unknown   Education / Equipment: HEP Plan: Patient agrees to discharge.  Patient goals were partially met. Patient is being discharged due to a change in medical status.  ?????    SJeral Pinch PT 09/27/16 2:22 PM

## 2016-08-21 ENCOUNTER — Ambulatory Visit: Payer: PRIVATE HEALTH INSURANCE | Admitting: Family Medicine

## 2016-08-23 ENCOUNTER — Ambulatory Visit: Payer: PRIVATE HEALTH INSURANCE | Admitting: Family Medicine

## 2016-08-23 ENCOUNTER — Ambulatory Visit (INDEPENDENT_AMBULATORY_CARE_PROVIDER_SITE_OTHER): Payer: PRIVATE HEALTH INSURANCE | Admitting: Family Medicine

## 2016-08-23 VITALS — BP 141/78 | HR 52 | Wt 199.0 lb

## 2016-08-23 DIAGNOSIS — M79671 Pain in right foot: Secondary | ICD-10-CM

## 2016-08-23 NOTE — Progress Notes (Signed)
Fred HoldingCharles Bond is a 66 y.o. male who [facility] today for follow-up right foot pain. 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 He was treated empirically with compression and physical therapy. He's been to physical therapy now 6 times and notes a moderate improvement. However the pain is still persistent and presently interferes with his ability to work and do normal activities. He denies any fevers or chills or repeat injury.   Past Medical History:  Diagnosis Date  . Asthma 08/07/2012   Childhood   . History of iron deficiency 08/07/2012  . Recurrent pneumonia 08/07/2012  . Tear of meniscus of left knee 08/07/2012   No past surgical history on file. Social History  Substance Use Topics  . Smoking status: Never Smoker  . Smokeless tobacco: Not on file  . Alcohol use No     ROS:  As above   Medications: Current Outpatient Prescriptions  Medication Sig Dispense Refill  . AMBULATORY NON FORMULARY MEDICATION flinstone vitamin    . aspirin 81 MG tablet Take 81 mg by mouth daily.    . diclofenac sodium (VOLTAREN) 1 % GEL Apply 2 g topically 4 (four) times daily. To affected joint. 100 g 11  . escitalopram (LEXAPRO) 20 MG tablet Take 1 tablet (20 mg total) by mouth daily. 90 tablet 1  . L-Lysine 500 MG TABS Take by mouth.    . Multiple Vitamins-Minerals (CENTRUM SILVER ULTRA MENS) TABS Take by mouth.    . niacin 500 MG tablet Take 500 mg by mouth daily with breakfast.    . sildenafil (REVATIO) 20 MG tablet 1-4 tabs by mouth as needed for intercourse. 50 tablet 11  . zoster vaccine live, PF, (ZOSTAVAX) [id number] UNT/0.65ML injection Inject 19,400 Units into the skin once. 1 each 0   No current facility-administered medications for this visit.    No Known Allergies   Exam:  BP (!) 141/78   Pulse (!) 52   Wt 199 lb (90.3 kg)   BMI 27.75 kg/m  General: Well  Developed, well nourished, and in no acute distress.  Neuro/Psych: Alert and oriented x3, extra-ocular muscles intact, able to move all 4 extremities, sensation grossly intact. Skin: Warm and dry, no rashes noted.  Respiratory: Not using accessory muscles, speaking in full sentences, trachea midline.  Cardiovascular: Pulses palpable, no extremity edema. Abdomen: Does not appear distended. MSK: Foot normal-appearing. Mildly tender palpation near the fifth metatarsal proximal head and just distal to the metatarsal head. Pulses Refill sensation of motion are intact.  X-ray dated 07/10/2016 reviewed.    No results found for this or any previous visit (from the past 48 hour(s)). No results found.    Assessment and Plan: 66 y.o. male with right lateral foot pain not significantly improved following physical therapy trial. Concerning for insertional peroneal tendinitis. Plan for MRI to further evaluate cause of pain including evaluate for potential stress fracture versus tendon rupture or tear and injection planning.    Orders Placed This Encounter  Procedures  . MR FOOT RIGHT WO CONTRAST    Order Specific Question:   Reason for exam:    Answer:   pain 5th metatarsal head x months no better with PT. Suspect peroneal tendonitis    Order Specific Question:   Preferred [facility]?    [facility]  (table limit-350lbs)    Order Specific Question:   Does the patient have a  pacemaker or implanted devices?    Answer:   No    Order Specific Question:   What is the patient's sedation requirement?    Answer:   No Sedation    Discussed warning signs or symptoms. Please see discharge instructions. Patient expresses understanding.

## 2016-08-23 NOTE — Patient Instructions (Signed)
Thank you for coming in today. Continue home exercises.  You should hear about MRI soon.  Schedule a few days after MRI to go over findings.

## 2016-09-03 ENCOUNTER — Ambulatory Visit (INDEPENDENT_AMBULATORY_CARE_PROVIDER_SITE_OTHER): Payer: PRIVATE HEALTH INSURANCE

## 2016-09-03 DIAGNOSIS — M19071 Primary osteoarthritis, right ankle and foot: Secondary | ICD-10-CM

## 2016-09-05 ENCOUNTER — Ambulatory Visit (INDEPENDENT_AMBULATORY_CARE_PROVIDER_SITE_OTHER): Payer: PRIVATE HEALTH INSURANCE | Admitting: Family Medicine

## 2016-09-05 DIAGNOSIS — D7589 Other specified diseases of blood and blood-forming organs: Secondary | ICD-10-CM

## 2016-09-05 DIAGNOSIS — R937 Abnormal findings on diagnostic imaging of other parts of musculoskeletal system: Secondary | ICD-10-CM | POA: Insufficient documentation

## 2016-09-05 NOTE — Progress Notes (Signed)
Fred Bond is a 66 y.o. male who presents to [facility] today for follow-up right foot MRI. She has been seen several times for right lateral foot pain initially thought to be peroneal brevis tendinitis. He's had a trial of home physical therapy and compression and stabilization. He's had some improvement but not significant improvement. He had an MRI a few days ago and is here today to discuss follow-up. He notes he continues to experience lateral foot pain especially worse with and following activity.   Past Medical History:  Diagnosis Date  . Asthma 08/07/2012   Childhood   . History of iron deficiency 08/07/2012  . Recurrent pneumonia 08/07/2012  . Tear of meniscus of left knee 08/07/2012   No past surgical history on file. Social History  Substance Use Topics  . Smoking status: Never Smoker  . Smokeless tobacco: Not on file  . Alcohol use No     ROS:  As above   Medications: Current Outpatient Prescriptions  Medication Sig Dispense Refill  . AMBULATORY NON FORMULARY MEDICATION flinstone vitamin    . aspirin 81 MG tablet Take 81 mg by mouth daily.    . diclofenac sodium (VOLTAREN) 1 % GEL Apply 2 g topically 4 (four) times daily. To affected joint. 100 g 11  . escitalopram (LEXAPRO) 20 MG tablet Take 1 tablet (20 mg total) by mouth daily. 90 tablet 1  . L-Lysine 500 MG TABS Take by mouth.    . Multiple Vitamins-Minerals (CENTRUM SILVER ULTRA MENS) TABS Take by mouth.    . niacin 500 MG tablet Take 500 mg by mouth daily with breakfast.    . sildenafil (REVATIO) 20 MG tablet 1-4 tabs by mouth as needed for intercourse. 50 tablet 11  . zoster vaccine live, PF, (ZOSTAVAX) [id number] UNT/0.65ML injection Inject 19,400 Units into the skin once. 1 each 0   No current facility-administered medications for this visit.    No Known Allergies   Exam:  BP 134/62   Pulse 64   Wt 201 lb (91.2 kg)   BMI 28.03 kg/m  General: Well  Developed, well nourished, and in no acute distress.  Neuro/Psych: Alert and oriented x3, extra-ocular muscles intact, able to move all 4 extremities, sensation grossly intact. Skin: Warm and dry, no rashes noted.  Respiratory: Not using accessory muscles, speaking in full sentences, trachea midline.  Cardiovascular: Pulses palpable, no extremity edema. Abdomen: Does not appear distended. MSK: Tender palpation right lateral mid and forefoot.    No results found for this or any previous visit (from the past 48 hour(s)). Mr Foot Right Wo Contrast  Result Date: 09/03/2016 CLINICAL DATA:  Lateral right foot pain since an injury running one year ago. No recent injury. EXAM: MRI OF THE RIGHT ANKLE WITHOUT CONTRAST TECHNIQUE: Multiplanar, multisequence MR imaging of the right ankle was performed. No intravenous contrast was administered. COMPARISON:  Plain films right foot 07/10/2016. FINDINGS: Bones/Joint/Cartilage There is intense marrow edema throughout the cuboid bone and majority of the fourth and fifth metatarsals. Marked fourth and fifth tarsometatarsal joint space narrowing is present with subchondral cyst formation and osteophytosis. Edema in the navicular and a subchondral cyst are identified at its articulation with the medial cuneiform. No fracture is seen. Ligaments Intact. Muscles and Tendons Intact. Soft tissues Subcutaneous edema is seen adjacent to the fourth and fifth metatarsals. 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 Degenerative change is worse at the fourth TMT joint. No displaced fracture is seen. Edema in navicular centered at its articulation with the medial cuneiform is also like related to osteoarthritis. Negative for tendon or ligament tear. Electronically Signed   By: Drusilla Kannerhomas  Dalessio M.D.   On: 09/03/2016 09:39       Assessment and Plan: 66 y.o. male with foot pain due to stress reaction of the cuboid fourth and fifth metatarsals and partial stress reaction at the navicular. The pain is stress reaction is thought to be due to significant DJD noted on MRI at the tarsal metatarsal joints laterally.  Lengthy discussion about treatment options. I think he probably do best with complete nonweightbearing in a cast however he notes this is not compatible with his life. Plan for a cam walker boot for 4-6 weeks and recheck. We'll titrate immobilization based on pain response.    No orders of the defined types were placed in this encounter.   Discussed warning signs or symptoms. Please see discharge instructions. Patient expresses understanding.

## 2016-09-05 NOTE — Patient Instructions (Signed)
Thank you for coming in today. Use the cam walker boot for 6 weeks or so.  If this is not an option please use the tuff toe steel insert.  Recheck in 4 weeks.  Return sooner if the pain is not improving or is worsening.   Get a Steel Turf Toe insole.  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 A stress fracture can be fully broken (complete) or partially broken (incomplete). The most common sites for stress fractures are the bones in the front of your feet (metatarsals), your heels (calcaneus), and the long bone of your lower leg (tibia). What are the causes? A stress fracture is caused by overuse or repetitive exercise, such as running. It happens when a bone cannot absorb any more shock because the muscles around it are weak. Stress fractures happen most commonly when:  You rapidly increase or start a new physical activity.  You use shoes that are worn out or do not fit you properly.  You exercise on a new surface. What increases the risk? You may be at higher risk for this type of fracture if:  You have a condition that causes weak bones (osteoporosis).  You are male. Stress fractures are more likely to occur in women. What are the signs or symptoms? The most common symptom of a stress fracture is feeling pain when you are using the affected part of your body. The pain usually goes away when you are resting. Other symptoms may include:  Swelling of the affected area.  Pain in the area when it is touched.  Decreased pain while resting. Stress fracture pain usually develops over time. How is this diagnosed? Diagnosis may include:  Medical history and physical exam.  X-rays.  Bone scan.  MRI. How is this treated? Treatment depends on the severity of your stress fracture. Treatment usually involves resting, icing, compression, and elevation (RICE) of the affected part of your body.  Treatment may also include:  Medicines to reduce inflammation.  A cast or a walking shoe.  Crutches.  Surgery. Follow these instructions at home: If you have a cast:  Do not stick anything inside the cast to scratch your skin. Doing that increases your risk of infection.  Check the skin around the cast every day. Report any concerns to your health care provider. You may put lotion on dry skin around the edges of the cast. Do not apply lotion to the skin underneath the cast.  Keep the cast clean and dry.  Cover the cast with a watertight plastic bag to protect it from water while you take a bath or a shower. Do not let the cast get wet.  Do not put pressure on any part of the cast until it is fully hardened. This may take several hours. If You Have a Walking Shoe:   Wear it as directed by your health care provider. Managing pain, stiffness, and swelling  If directed, apply ice to the injured area:  Put ice in a plastic bag.  Place a towel between your skin and the bag.  Leave the ice on for 20 minutes, 2-3 times per day.  Move your fingers or toes often to avoid stiffness and to lessen swelling.  Raise the injured area above the level of your heart while you are sitting or lying down. Activity  Rest as directed by your health care provider.  Ask your health care provider if you may do alternative exercises, such as swimming or biking, while you are healing.  Return to your normal activities as directed by your health care provider. Ask your health care provider what activities are safe for you.  Perform range-of-motion exercises only as directed by your health care provider. Safety  Do not use the injured limb to support yourbody weight until your health care provider says that you can. Use crutches if your health care provider tells you to do so. General instructions  Do not use any tobacco products, including cigarettes, chewing tobacco, or electronic cigarettes.  Tobacco can delay bone healing. If you need help quitting, ask your health care provider.  Take medicines only as directed by your health care provider.  Keep all follow-up visits as directed by your health care provider. This is important. How is this prevented?  Only wear shoes that:  Fit well.  Are not worn out.  Eat a healthy diet that contains vitamin D and calcium. This helps keeps your bones strong.  Be careful when you start a new physical activity. Give your body time to adjust.  Avoid doing only one kind of activity. Do different exercises, such as swimming and running, so that no single part of your body gets overused.  Do strength-training exercises. Contact a health care provider if:  Your pain gets worse.  You have new symptoms.  You have increased swelling. Get help right away if:  You lose feeling in the affected area. This information is not intended to replace advice given to you by your health care provider. Make sure you discuss any questions you have with your health care provider. Document Released: 10/06/2002 Document Revised: 03/14/2016 Document Reviewed: 02/18/2014 Elsevier Interactive Patient Education  2017 [employer]   Avascular Necrosis Introduction Avascular necrosis is a disease resulting from the temporary or permanent loss of blood supply to a bone. This disease may also be known as:  Osteonecrosis.  Aseptic necrosis.  Ischemic bone necrosis. Without proper blood supply, the internal layer of the affected bone dies and the outer layer of the bone may break down. If this process affects a bone near a joint, it may lead to collapse of that joint. Common bones that are affected by this condition include:  The top of your thigh bone (femoral head).  One or more bones in your wrist (scaphoid orlunate).  One or more bones in your foot (metatarsals).  One of the bones in your ankle (navicular). The joint most commonly affected by  this condition is the hip joint. Avascular necrosis rarely occurs in more than one bone at a time. What are the causes?  Damage or injury to a bone or joint.  Using corticosteroid medicine for a long period of time.  Changes in your immune or hormone systems.  Excessive exposure to radiation. What increases the risk?  Alcohol abuse.  Previous traumatic injury to a joint.  Using corticosteroid medicines for a long period of time or often.  Having a medical condition such as:  HIV or AIDS.  Diabetes.  Sickle cell disease.  An autoimmune disease. What are the signs or symptoms? The main symptoms of avascular necrosis are pain and decreased motion in the affected bone or joint. In the early stages the pain may be minor and occur only with activity. As avascular necrosis progresses, pain may gradually worsen and occur while at rest. The pain may suddenly become severe if an affected joint collapses.  How is this diagnosed? Avascular necrosis may be diagnosed with:  A medical history.  A physical exam.  X-rays.  An MRI.  A bone scan. How is this treated? Treatments may include:  Medicine to help relieve pain.  Avoiding placing any pressure or weight ontheaffected area. If avascular necrosis occurs in your hip, ankle, or foot, you may be instructed to use crutches or a rolling scooter.  Surgery, such as:  Core decompression. In this surgery, one or more holes are placed in the bone for new blood vessels to grow into. This provides a renewed blood supply to the bone. Core decompression can often reduce pain and pressure in the affected bone and slow the progression of bone and joint destruction.  Osteotomy. In this surgery, the bone is reshaped to reduce stress on the affected area of the joint.  Bone grafting. In this surgery, healthy bone from one part of your body is transplanted to the affected area.  Arthroplasty. Arthroplasty is also known as total joint  replacement. In this surgery, the affected surface on one or both sides of a joint is replaced with artificial parts (prostheses).  Electrical stimulation. This may help encourage new bone growth. Follow these instructions at home:  Take medicines only as directed by your health care provider.  Follow your health care provider's recommendations on limiting activities or using crutches to rest your affected joint.  Meet with aphysical therapist as directed by your health care provider.  Keep all follow-up visits as directed by your health care provider. This is important. Contact a health care provider if:  Your pain worsens.  You have decreased motion in your affected joint. Get help right away if: Your pain suddenly becomes severe. This information is not intended to replace advice given to you by your health care provider. Make sure you discuss any questions you have with your health care provider. Document Released: 01/05/2002 Document Revised: 12/22/2015 Document Reviewed: 09/23/2013  2017 Elsevier

## 2016-09-12 ENCOUNTER — Ambulatory Visit (INDEPENDENT_AMBULATORY_CARE_PROVIDER_SITE_OTHER): Payer: PRIVATE HEALTH INSURANCE

## 2016-09-12 ENCOUNTER — Ambulatory Visit (INDEPENDENT_AMBULATORY_CARE_PROVIDER_SITE_OTHER): Payer: PRIVATE HEALTH INSURANCE | Admitting: Family Medicine

## 2016-09-12 VITALS — BP 120/57 | HR 65 | Temp 98.6°F | Wt 199.0 lb

## 2016-09-12 DIAGNOSIS — R05 Cough: Secondary | ICD-10-CM

## 2016-09-12 DIAGNOSIS — R059 Cough, unspecified: Secondary | ICD-10-CM

## 2016-09-12 DIAGNOSIS — J189 Pneumonia, unspecified organism: Secondary | ICD-10-CM

## 2016-09-12 DIAGNOSIS — J4520 Mild intermittent asthma, uncomplicated: Secondary | ICD-10-CM | POA: Diagnosis not present

## 2016-09-12 MED ORDER — ALBUTEROL SULFATE HFA 108 (90 BASE) MCG/ACT IN AERS: 2.0000 | INHALATION_SPRAY | Freq: Four times a day (QID) | RESPIRATORY_TRACT | 0 refills | Status: DC | PRN

## 2016-09-12 MED ORDER — LEVOFLOXACIN 500 MG PO TABS: 500.0000 mg | ORAL_TABLET | Freq: Every day | ORAL | 0 refills | Status: DC

## 2016-09-12 MED ORDER — BENZONATATE 200 MG PO CAPS: 200.0000 mg | ORAL_CAPSULE | Freq: Three times a day (TID) | ORAL | 0 refills | Status: DC | PRN

## 2016-09-12 MED ORDER — GUAIFENESIN-CODEINE 100-10 MG/5ML PO SOLN: 5.0000 mL | Freq: Every evening | ORAL | 0 refills | Status: DC | PRN

## 2016-09-12 NOTE — Progress Notes (Signed)
Fred Bond is a 66 y.o. male who presents to [name]: Primary [facility] Medicine today for cough congestion runny nose. Symptoms present for about 5 days. Patient has tried some over-the-counter medications which helped a bit. He was improving but then worsened again 2 days ago with fevers chills night sweats productive cough. He has a history of recurrent pneumonia and is worried that he is getting pneumonia again. He denies significant shortness of breath chest pain or vomiting.   Past Medical History:  Diagnosis Date  . Asthma 08/07/2012   Childhood   . History of iron deficiency 08/07/2012  . Recurrent pneumonia 08/07/2012  . Tear of meniscus of left knee 08/07/2012   No past surgical history on file. Social History  Substance Use Topics  . Smoking status: Never Smoker  . Smokeless tobacco: Not on file  . Alcohol use No   family history is not on file.  ROS as above:  Medications: Current Outpatient Prescriptions  Medication Sig Dispense Refill  . AMBULATORY NON FORMULARY MEDICATION flinstone vitamin    . aspirin 81 MG tablet Take 81 mg by mouth daily.    . diclofenac sodium (VOLTAREN) 1 % GEL Apply 2 g topically 4 (four) times daily. To affected joint. 100 g 11  . escitalopram (LEXAPRO) 20 MG tablet Take 1 tablet (20 mg total) by mouth daily. 90 tablet 1  . L-Lysine 500 MG TABS Take by mouth.    . Multiple Vitamins-Minerals (CENTRUM SILVER ULTRA MENS) TABS Take by mouth.    . niacin 500 MG tablet Take 500 mg by mouth daily with breakfast.    . sildenafil (REVATIO) 20 MG tablet 1-4 tabs by mouth as needed for intercourse. 50 tablet 11  . zoster vaccine live, PF, (ZOSTAVAX) 04540 UNT/0.65ML injection Inject 19,400 Units into the skin once. 1 each 0  . albuterol (PROVENTIL HFA;VENTOLIN HFA) 108 (90 Base) MCG/ACT inhaler Inhale 2 puffs into the lungs every 6 (six) hours as needed for  wheezing or shortness of breath. 1 Inhaler 0  . benzonatate (TESSALON) 200 MG capsule Take 1 capsule (200 mg total) by mouth 3 (three) times daily as needed for cough. 45 capsule 0  . guaiFENesin-codeine 100-10 MG/5ML syrup Take 5 mLs by mouth at bedtime as needed for cough. 120 mL 0  . levofloxacin (LEVAQUIN) 500 MG tablet Take 1 tablet (500 mg total) by mouth daily. 7 tablet 0   No current facility-administered medications for this visit.    No Known Allergies  Health Maintenance Health Maintenance  Topic Date Due  . Hepatitis C Screening  February 10, 1951  . HIV Screening  12/26/1967  . ZOSTAVAX  12/25/2012  . TETANUS/TDAP  03/30/2024  . COLONOSCOPY  06/29/2025  . INFLUENZA VACCINE  Completed     Exam:  BP (!) 120/57   Pulse 65   Temp 98.6 F (37 C) (Oral)   Wt 199 lb (90.3 kg)   SpO2 100%   BMI 27.75 kg/m  Gen: Well NAD Nontoxic appearing HEENT: EOMI,  MMM clear nasal discharge Lungs: Normal work of breathing. CTABL Heart: RRR no MRG Abd: NABS, Soft. Nondistended, Nontender Exts: Brisk capillary refill, warm and well perfused.    No results found for this or any previous visit (from the past 72 hour(s)). Dg Chest 2 View  Result Date: 09/12/2016 CLINICAL DATA:  Cough. EXAM: CHEST  2 VIEW COMPARISON:  06/02/2015 FINDINGS: Bibasilar opacities are noted. Cannot exclude pneumonia. Heart is normal  size. No effusions or acute bony abnormality. IMPRESSION: Bibasilar atelectasis or infiltrates.  Cannot exclude pneumonia. Electronically Signed   By: Charlett NoseKevin  Dover M.D.   On: 09/12/2016 12:10      Assessment and Plan: 66 y.o. male with second sickening due to viral bronchitis. Patient may have pneumonia based on exam and chest x-ray findings. We'll treat with Levaquin as well as albuterol due to history of asthma and codeine cough syrup and Tessalon Perles for cough suppression. Return as needed.   Orders Placed This Encounter  Procedures  . DG Chest 2 View    Order Specific  Question:   Reason for exam:    Answer:   Cough, assess intra-thoracic pathology    Order Specific Question:   Preferred imaging [facility]?    [facility]   Meds ordered this encounter  Medications  . levofloxacin (LEVAQUIN) 500 MG tablet    Sig: Take 1 tablet (500 mg total) by mouth daily.    Dispense:  7 tablet    Refill:  0  . albuterol (PROVENTIL HFA;VENTOLIN HFA) 108 (90 Base) MCG/ACT inhaler    Sig: Inhale 2 puffs into the lungs every 6 (six) hours as needed for wheezing or shortness of breath.    Dispense:  1 Inhaler    Refill:  0  . guaiFENesin-codeine 100-10 MG/5ML syrup    Sig: Take 5 mLs by mouth at bedtime as needed for cough.    Dispense:  120 mL    Refill:  0  . benzonatate (TESSALON) 200 MG capsule    Sig: Take 1 capsule (200 mg total) by mouth 3 (three) times daily as needed for cough.    Dispense:  45 capsule    Refill:  0     Discussed warning signs or symptoms. Please see discharge instructions. Patient expresses understanding.

## 2016-09-12 NOTE — Patient Instructions (Signed)
Thank you for coming in today. Get xray today.  Use tessalon pearles for daytime cough Use codeine for bedtime cough.  USe albuterol as needed for wheezing.  Use antibiotics if pneumonia on xray today or continue to worsen.  Call or go to the emergency room if you get worse, have trouble breathing, have chest pains, or palpitations.    Cough, Adult Introduction A cough helps to clear your throat and lungs. A cough may last only 2-3 weeks (acute), or it may last longer than 8 weeks (chronic). Many different things can cause a cough. A cough may be a sign of an illness or another medical condition. Follow these instructions at home:  Pay attention to any changes in your cough.  Take medicines only as told by your doctor.  If you were prescribed an antibiotic medicine, take it as told by your doctor. Do not stop taking it even if you start to feel better.  Talk with your doctor before you try using a cough medicine.  Drink enough fluid to keep your pee (urine) clear or pale yellow.  If the air is dry, use a cold steam vaporizer or humidifier in your home.  Stay away from things that make you cough at work or at home.  If your cough is worse at night, try using extra pillows to raise your head up higher while you sleep.  Do not smoke, and try not to be around smoke. If you need help quitting, ask your doctor.  Do not have caffeine.  Do not drink alcohol.  Rest as needed. Contact a doctor if:  You have new problems (symptoms).  You cough up yellow fluid (pus).  Your cough does not get better after 2-3 weeks, or your cough gets worse.  Medicine does not help your cough and you are not sleeping well.  You have pain that gets worse or pain that is not helped with medicine.  You have a fever.  You are losing weight and you do not know why.  You have night sweats. Get help right away if:  You cough up blood.  You have trouble breathing.  Your heartbeat is very  fast. This information is not intended to replace advice given to you by your health care provider. Make sure you discuss any questions you have with your health care provider. Document Released: 03/29/2011 Document Revised: 12/22/2015 Document Reviewed: 09/22/2014  2017 Elsevier

## 2016-09-25 ENCOUNTER — Encounter: Payer: Self-pay | Admitting: Family Medicine

## 2016-09-25 DIAGNOSIS — H33313 Horseshoe tear of retina without detachment, bilateral: Secondary | ICD-10-CM | POA: Insufficient documentation

## 2016-09-27 ENCOUNTER — Other Ambulatory Visit: Payer: Self-pay

## 2016-09-27 MED ORDER — ESCITALOPRAM OXALATE 20 MG PO TABS: 20.0000 mg | ORAL_TABLET | Freq: Every day | ORAL | 0 refills | Status: DC

## 2016-09-28 ENCOUNTER — Encounter: Payer: Self-pay | Admitting: Physician Assistant

## 2016-09-28 ENCOUNTER — Ambulatory Visit (INDEPENDENT_AMBULATORY_CARE_PROVIDER_SITE_OTHER): Payer: PRIVATE HEALTH INSURANCE | Admitting: Physician Assistant

## 2016-09-28 VITALS — BP 112/59 | HR 69 | Temp 98.2°F | Wt 195.0 lb

## 2016-09-28 DIAGNOSIS — J189 Pneumonia, unspecified organism: Secondary | ICD-10-CM | POA: Diagnosis not present

## 2016-09-28 DIAGNOSIS — R05 Cough: Secondary | ICD-10-CM | POA: Diagnosis not present

## 2016-09-28 DIAGNOSIS — R053 Chronic cough: Secondary | ICD-10-CM

## 2016-09-28 LAB — CBC WITH DIFFERENTIAL/PLATELET
Basophils Absolute: 0 cells/uL (ref 0–200)
Basophils Relative: 0 %
EOS ABS: 0 {cells}/uL — AB (ref 15–500)
Eosinophils Relative: 0 %
HCT: 38.5 % (ref 38.5–50.0)
Hemoglobin: 12.9 g/dL — ABNORMAL LOW (ref 13.2–17.1)
Lymphocytes Relative: 8 %
Lymphs Abs: 1160 cells/uL (ref 850–3900)
MCH: 31.9 pg (ref 27.0–33.0)
MCHC: 33.5 g/dL (ref 32.0–36.0)
MCV: 95.3 fL (ref 80.0–100.0)
MONOS PCT: 10 %
MPV: 9 fL (ref 7.5–12.5)
Monocytes Absolute: 1450 cells/uL — ABNORMAL HIGH (ref 200–950)
Neutro Abs: 11890 cells/uL — ABNORMAL HIGH (ref 1500–7800)
Neutrophils Relative %: 82 %
PLATELETS: 225 10*3/uL (ref 140–400)
RBC: 4.04 MIL/uL — ABNORMAL LOW (ref 4.20–5.80)
RDW: 13.3 % (ref 11.0–15.0)
WBC: 14.5 10*3/uL — ABNORMAL HIGH (ref 3.8–10.8)

## 2016-09-28 MED ORDER — AZITHROMYCIN 250 MG PO TABS: ORAL_TABLET | ORAL | 0 refills | Status: DC

## 2016-09-28 MED ORDER — PREDNISONE 50 MG PO TABS: ORAL_TABLET | ORAL | 0 refills | Status: DC

## 2016-09-28 NOTE — Patient Instructions (Addendum)
-   go downstairs for blood work - take antibiotic and steroids as prescribed - if no improvement over the weekend, CT chest next week and follow-up appointment

## 2016-09-28 NOTE — Progress Notes (Signed)
HPI:                                                                Fred Bond is a 66 y.o. male who presents to [facility]: Primary Care [facility] Medicine today for cough  Patient with history of recurrent pneumonia (2014) reports chills, myalgias, and tactile fever beginning last night. He took Ibuprofen for fever. He denies congestion, rhinorrhea, ear pain or sinus pain. Patient states he has had a cough for the whole month of February that varies between dry and productive of mucus. He was diagnosed with bibasilar pneumonia by CXR on 09/12/16 and completed course of Levaquin. Patient states he felt improved, but the cough never really went away. He is a nonsmoker. No history of asthma, COPD, or underlying lung disease.    Past Medical History:  Diagnosis Date  . Asthma 08/07/2012   Childhood   . History of iron deficiency 08/07/2012  . Recurrent pneumonia 08/07/2012  . Tear of meniscus of left knee 08/07/2012   No past surgical history on file. Social History  Substance Use Topics  . Smoking status: Never Smoker  . Smokeless tobacco: Never Used  . Alcohol use No   family history is not on file.  ROS: negative except as noted in the HPI  Medications: Current Outpatient Prescriptions  Medication Sig Dispense Refill  . albuterol (PROVENTIL HFA;VENTOLIN HFA) 108 (90 Base) MCG/ACT inhaler Inhale 2 puffs into the lungs every 6 (six) hours as needed for wheezing or shortness of breath. 1 Inhaler 0  . AMBULATORY NON FORMULARY MEDICATION flinstone vitamin    . aspirin 81 MG tablet Take 81 mg by mouth daily.    . diclofenac sodium (VOLTAREN) 1 % GEL Apply 2 g topically 4 (four) times daily. To affected joint. 100 g 11  . escitalopram (LEXAPRO) 20 MG tablet Take 1 tablet (20 mg total) by mouth daily. Need to establish care with new Provider. 30 tablet 0  . L-Lysine 500 MG TABS Take by mouth.    . Multiple Vitamins-Minerals (CENTRUM SILVER ULTRA MENS) TABS Take by mouth.     . niacin 500 MG tablet Take 500 mg by mouth daily with breakfast.    . sildenafil (REVATIO) 20 MG tablet 1-4 tabs by mouth as needed for intercourse. 50 tablet 11  . zoster vaccine live, PF, (ZOSTAVAX) 29562 UNT/0.65ML injection Inject 19,400 Units into the skin once. 1 each 0   No current facility-administered medications for this visit.    No Known Allergies     Objective:  BP (!) 112/59   Pulse 69   Temp 98.2 F (36.8 C) (Oral)   Wt 195 lb (88.5 kg)   BMI 27.20 kg/m  Gen: well-groomed, cooperative, not ill-appearing, no distress HEENT: normal conjunctiva, TM's clear, oropharynx clear, moist mucus membranes, no frontal or maxillary sinus tenderness Pulm: Normal work of breathing, normal phonation, clear to auscultation bilaterally, no wheezes, rales or rhonchi CV: Normal rate, regular rhythm, s1 and s2 distinct, no murmurs, clicks or rubs  GI: soft, nondistended, nontender Neuro: alert and oriented x 3, EOM's intact Lymph: no cervical or tonsillar adenopathy Skin: warm and dry, no rashes or lesions on exposed skin, no cyanosis  CXR 09/12/2016 CLINICAL DATA:  Cough.  EXAM:  CHEST  2 VIEW  COMPARISON:  06/02/2015  FINDINGS: Bibasilar opacities are noted. Cannot exclude pneumonia. Heart is normal size. No effusions or acute bony abnormality.  IMPRESSION: Bibasilar atelectasis or infiltrates.  Cannot exclude pneumonia.   Electronically Signed   By: Charlett NoseKevin  Dover M.D.   On: 09/12/2016 12:10  Assessment and Plan: 66 y.o. male with   1. Cough, persistent, Recurrent pneumonia - Afebrile, no adventitious sounds on exam today. No respiratory distress. Will cover for CAP and treat with steroid burst - if no improvement, recommend CT chest to assess recurrent PNA - recommend spirometry when patient is no longer acutely ill - azithromycin (ZITHROMAX Z-PAK) 250 MG tablet; Take 2 tablets (500 mg) on  Day 1,  followed by 1 tablet (250 mg) once daily on Days 2 through  5.  Dispense: 6 tablet; Refill: 0 - predniSONE (DELTASONE) 50 MG tablet; One tab PO daily for 5 days.  Dispense: 5 tablet; Refill: 0 - CT CHEST WO CONTRAST  Patient education and anticipatory guidance given Patient agrees with treatment plan Follow-up in 1 week or sooner as needed  Levonne Hubertharley E. Cummings PA-C

## 2016-09-29 LAB — COMPLETE METABOLIC PANEL WITH GFR
ALBUMIN: 3.5 g/dL — AB (ref 3.6–5.1)
ALK PHOS: 70 U/L (ref 40–115)
ALT: 22 U/L (ref 9–46)
AST: 29 U/L (ref 10–35)
BILIRUBIN TOTAL: 0.7 mg/dL (ref 0.2–1.2)
BUN: 12 mg/dL (ref 7–25)
CALCIUM: 8.4 mg/dL — AB (ref 8.6–10.3)
CO2: 24 mmol/L (ref 20–31)
Chloride: 101 mmol/L (ref 98–110)
Creat: 1.09 mg/dL (ref 0.70–1.25)
GFR, Est African American: 83 mL/min (ref >=60)
GFR, Est Non African American: 72 mL/min (ref >=60)
Glucose, Bld: 89 mg/dL (ref 65–99)
POTASSIUM: 3.7 mmol/L (ref 3.5–5.3)
Sodium: 135 mmol/L (ref 135–146)
TOTAL PROTEIN: 6.1 g/dL (ref 6.1–8.1)

## 2016-10-02 DIAGNOSIS — R053 Chronic cough: Secondary | ICD-10-CM | POA: Insufficient documentation

## 2016-10-02 DIAGNOSIS — R05 Cough: Secondary | ICD-10-CM | POA: Insufficient documentation

## 2016-10-03 ENCOUNTER — Ambulatory Visit (INDEPENDENT_AMBULATORY_CARE_PROVIDER_SITE_OTHER): Payer: PRIVATE HEALTH INSURANCE

## 2016-10-03 ENCOUNTER — Ambulatory Visit (INDEPENDENT_AMBULATORY_CARE_PROVIDER_SITE_OTHER): Payer: PRIVATE HEALTH INSURANCE | Admitting: Family Medicine

## 2016-10-03 ENCOUNTER — Encounter: Payer: Self-pay | Admitting: Family Medicine

## 2016-10-03 VITALS — BP 141/69 | HR 58 | Wt 199.0 lb

## 2016-10-03 DIAGNOSIS — R918 Other nonspecific abnormal finding of lung field: Secondary | ICD-10-CM | POA: Diagnosis not present

## 2016-10-03 DIAGNOSIS — IMO0001 Reserved for inherently not codable concepts without codable children: Secondary | ICD-10-CM

## 2016-10-03 DIAGNOSIS — R05 Cough: Secondary | ICD-10-CM

## 2016-10-03 DIAGNOSIS — R911 Solitary pulmonary nodule: Secondary | ICD-10-CM

## 2016-10-03 DIAGNOSIS — J189 Pneumonia, unspecified organism: Secondary | ICD-10-CM

## 2016-10-03 DIAGNOSIS — R053 Chronic cough: Secondary | ICD-10-CM

## 2016-10-03 DIAGNOSIS — D7589 Other specified diseases of blood and blood-forming organs: Secondary | ICD-10-CM

## 2016-10-03 DIAGNOSIS — R937 Abnormal findings on diagnostic imaging of other parts of musculoskeletal system: Secondary | ICD-10-CM

## 2016-10-03 MED ORDER — BUDESONIDE-FORMOTEROL FUMARATE 160-4.5 MCG/ACT IN AERO: 2.0000 | INHALATION_SPRAY | Freq: Two times a day (BID) | RESPIRATORY_TRACT | 1 refills | Status: DC

## 2016-10-03 NOTE — Patient Instructions (Signed)
Thank you for coming in today. Get the CT scan today.  We will contact you with results and use more medicines if needed.  Use the Symbicort twice daily.  Recheck in 1 month or sooner if needed or if arranged.  Get the carbon fiber foot plate.      Community-Acquired Pneumonia, Adult Pneumonia is an infection of the lungs. There are different types of pneumonia. One type can develop while a person is in a hospital. A different type, called community-acquired pneumonia, develops in people who are not, or have not recently been, in the hospital or other health care facility. What are the causes? Pneumonia may be caused by bacteria, viruses, or funguses. Community-acquired pneumonia is often caused by Streptococcus pneumonia bacteria. These bacteria are often passed from one person to another by breathing in droplets from the cough or sneeze of an infected person. What increases the risk? The condition is more likely to develop in:  People who havechronic diseases, such as chronic obstructive pulmonary disease (COPD), asthma, congestive heart failure, cystic fibrosis, diabetes, or kidney disease.  People who haveearly-stage or late-stage HIV.  People who havesickle cell disease.  People who havehad their spleen removed (splenectomy).  People who havepoor Administratordental hygiene.  People who havemedical conditions that increase the risk of breathing in (aspirating) secretions their own mouth and nose.  People who havea weakened immune system (immunocompromised).  People who smoke.  People whotravel to areas where pneumonia-causing germs commonly exist.  People whoare around animal habitats or animals that have pneumonia-causing germs, including birds, bats, rabbits, cats, and farm animals. What are the signs or symptoms? Symptoms of this condition include:  Adry cough.  A wet (productive) cough.  Fever.  Sweating.  Chest pain, especially when breathing deeply or  coughing.  Rapid breathing or difficulty breathing.  Shortness of breath.  Shaking chills.  Fatigue.  Muscle aches. How is this diagnosed? Your health care provider will take a medical history and perform a physical exam. You may also have other tests, including:  Imaging studies of your chest, including X-rays.  Tests to check your blood oxygen level and other blood gases.  Other tests on blood, mucus (sputum), fluid around your lungs (pleural fluid), and urine. If your pneumonia is severe, other tests may be done to identify the specific cause of your illness. How is this treated? The type of treatment that you receive depends on many factors, such as the cause of your pneumonia, the medicines you take, and other medical conditions that you have. For most adults, treatment and recovery from pneumonia may occur at home. In some cases, treatment must happen in a hospital. Treatment may include:  Antibiotic medicines, if the pneumonia was caused by bacteria.  Antiviral medicines, if the pneumonia was caused by a virus.  Medicines that are given by mouth or through an IV tube.  Oxygen.  Respiratory therapy. Although rare, treating severe pneumonia may include:  Mechanical ventilation. This is done if you are not breathing well on your own and you cannot maintain a safe blood oxygen level.  Thoracentesis. This procedureremoves fluid around one lung or both lungs to help you breathe better. Follow these instructions at home:  Take over-the-counter and prescription medicines only as told by your health care provider.  Only takecough medicine if you are losing sleep. Understand that cough medicine can prevent your body's natural ability to remove mucus from your lungs.  If you were prescribed an antibiotic medicine, take it as told  by your health care provider. Do not stop taking the antibiotic even if you start to feel better.  Sleep in a semi-upright position at night. Try  sleeping in a reclining chair, or place a few pillows under your head.  Do not use tobacco products, including cigarettes, chewing tobacco, and e-cigarettes. If you need help quitting, ask your health care provider.  Drink enough water to keep your urine clear or pale yellow. This will help to thin out mucus secretions in your lungs. How is this prevented? There are ways that you can decrease your risk of developing community-acquired pneumonia. Consider getting a pneumococcal vaccine if:  You are older than 64 years of age.  You are older than 64 years of age and are undergoing cancer treatment, have chronic lung disease, or have other medical conditions that affect your immune system. Ask your health care provider if this applies to you. There are different types and schedules of pneumococcal vaccines. Ask your health care provider which vaccination option is best for you. You may also prevent community-acquired pneumonia if you take these actions:  Get an influenza vaccine every year. Ask your health care provider which type of influenza vaccine is best for you.  Go to the dentist on a regular basis.  Wash your hands often. Use hand sanitizer if soap and water are not available. Contact a health care provider if:  You have a fever.  You are losing sleep because you cannot control your cough with cough medicine. Get help right away if:  You have worsening shortness of breath.  You have increased chest pain.  Your sickness becomes worse, especially if you are an older adult or have a weakened immune system.  You cough up blood. This information is not intended to replace advice given to you by your health care provider. Make sure you discuss any questions you have with your health care provider. Document Released: 07/16/2005 Document Revised: 11/24/2015 Document Reviewed: 11/10/2014 Elsevier Interactive Patient Education  2017 [employer].

## 2016-10-03 NOTE — Progress Notes (Signed)
Fred HoldingCharles Bond is a 66 y.o. male who [name] today for continued cough elevated white blood cell count and foot pain.  Continued cough with elevated white blood cell count. Patient was diagnosed with community-acquired pneumonia in February and treated with Levaquin. He continued to cough and was seen by my partner earlier this week who suspected recurrent pneumonia and obtained a CBC which showed elevated white blood cell count at around 14. He notes that he's feeling pretty well and denies fevers or chills but does note continued wheezing and coarse breath sounds. He denies any vomiting or diarrhea. He denies chest pain or severe shortness of breath. He has remote history of asthma as child.  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 He's been immobilized with a cam walker boot for about 5 weeks now and notes significant improvement in symptoms. He notes he uses a cam walker about 50% of the time.   Past Medical History:  Diagnosis Date  . Asthma 08/07/2012   Childhood   . History of iron deficiency 08/07/2012  . Recurrent pneumonia 08/07/2012  . Tear of meniscus of left knee 08/07/2012   No past surgical history on file. Social History  Substance Use Topics  . Smoking status: Never Smoker  . Smokeless tobacco: Never Used  . Alcohol use No   family history is not on file.  ROS as above:  Medications: Current Outpatient Prescriptions  Medication Sig Dispense Refill  . albuterol (PROVENTIL HFA;VENTOLIN HFA) 108 (90 Base) MCG/ACT inhaler Inhale 2 puffs into the lungs every 6 (six) hours as needed for wheezing or shortness of breath. 1 Inhaler 0  . AMBULATORY NON FORMULARY MEDICATION flinstone vitamin    . aspirin 81 MG tablet Take 81 mg by mouth daily.    .  diclofenac sodium (VOLTAREN) 1 % GEL Apply 2 g topically 4 (four) times daily. To affected joint. 100 g 11  . escitalopram (LEXAPRO) 20 MG tablet Take 1 tablet (20 mg total) by mouth daily. Need to establish care with new Provider. 30 tablet 0  . L-Lysine 500 MG TABS Take by mouth.    . Multiple Vitamins-Minerals (CENTRUM SILVER ULTRA MENS) TABS Take by mouth.    . niacin 500 MG tablet Take 500 mg by mouth daily with breakfast.    . sildenafil (REVATIO) 20 MG tablet 1-4 tabs by mouth as needed for intercourse. 50 tablet 11  . zoster vaccine live, PF, (ZOSTAVAX) [id number] UNT/0.65ML injection Inject 19,400 Units into the skin once. 1 each 0   No current facility-administered medications for this visit.    No Known Allergies  Health Maintenance Health Maintenance  Topic Date Due  . Hepatitis C Screening  01-Apr-1951  . HIV Screening  12/26/1967  . TETANUS/TDAP  03/30/2024  . COLONOSCOPY  06/29/2025  . INFLUENZA VACCINE  Completed     Exam:  BP (!) 141/69   Pulse (!) 58   Wt 199 lb (90.3 kg)   SpO2 95%   BMI 27.75 kg/m  Gen: Well NAD nontoxic appearing HEENT: EOMI,  MMM Lungs: Normal work of breathing. Coarse breath sounds and wheezing present bilaterally Heart: RRR no MRG Abd: NABS, Soft. Nondistended, Nontender Exts: Brisk capillary refill, warm and well perfused.   Lab Results  Component Value Date   WBC 14.5 (H) 09/28/2016   HGB 12.9 (  L) 09/28/2016   HCT 38.5 09/28/2016   MCV 95.3 09/28/2016   PLT 225 09/28/2016      No results found for this or any previous visit (from the past 72 hour(s)). Ct Chest Wo Contrast  Result Date: 10/03/2016 CLINICAL DATA:  Productive cough and shortness of Breath EXAM: CT CHEST WITHOUT CONTRAST TECHNIQUE: Multidetector CT imaging of the chest was performed following the standard protocol without IV contrast. COMPARISON:  09/12/2016 FINDINGS: Cardiovascular: Somewhat limited due to lack of IV contrast. Aortic calcifications are seen without  definitive aneurysmal dilatation. No significant cardiac enlargement is seen. No coronary calcifications are noted. Mediastinum/Nodes: Thoracic inlet is within normal limits. No significant hilar or mediastinal adenopathy is noted. The esophagus as visualized is within normal limits. Lungs/Pleura: The lungs are well aerated bilaterally. No focal confluent infiltrate is seen. Patchy mild inflammatory infiltrates are noted in the lower lobes bilaterally. Some mild associated nodularity is noted. The largest of these on the left measures 6 mm best seen on image number 136 of series 3. The largest of these nodules on the right lies in the lower lobe measuring 5 mm in greatest dimension. This is best seen on image number 125 of series 3. No sizable effusion is noted. Upper Abdomen: Within normal limits. Musculoskeletal: No acute abnormality is noted. IMPRESSION: Mild bilateral lower lobe infiltrative changes. Small lower lobe nodules bilaterally. Non-contrast chest CT at 3-6 months is recommended. If the nodules are stable at time of repeat CT, then future CT at 18-24 months (from today's scan) is considered optional for low-risk patients, but is recommended for high-risk patients. This recommendation follows the consensus statement: Guidelines for Management of Incidental Pulmonary Nodules Detected on CT Images: From the Fleischner Society 2017; Radiology 2017; 284:228-243. Electronically Signed   By: Alcide Clever M.D.   On: 10/03/2016 10:08      Assessment and Plan: 66 y.o. male with  Continued cough and wheezing in the setting of infiltrate on chest CT scan and elevated white blood cell count. This is concerning for recurrent pneumonia. Fortunately he's feeling a lot better with azithromycin. There is no clear very large infiltrate think it's reasonable to presume that we are successfully treating bacterial pneumonia with azithromycin. We'll hold off on any further antibiotics but will continue to dispense more  if he worsens.. Additionally I have dispensed Symbicort for one month as I think there may be a component of reactive airway disease/bronchitis year I would like to avoid systemic steroids as this alert. His foot healing. Recheck in a week or 2.  Foot pain: Doing much better. Continue mobilization. We'll transition to a carbon fiber foot plate in the near future and recheck in about a month or so.  Lung nodules incidentally seen on chest CT scan. Per guidelines we'll repeat his chest CT scan in 6 months.   No orders of the defined types were placed in this encounter.  No orders of the defined types were placed in this encounter.    Discussed warning signs or symptoms. Please see discharge instructions. Patient expresses understanding.  I spent 40 minutes with this patient, greater than 50% was face-to-face time counseling regarding the above diagnosis.

## 2016-10-24 ENCOUNTER — Other Ambulatory Visit: Payer: Self-pay | Admitting: Osteopathic Medicine

## 2016-10-29 ENCOUNTER — Encounter: Payer: Self-pay | Admitting: Family Medicine

## 2016-10-29 ENCOUNTER — Ambulatory Visit (INDEPENDENT_AMBULATORY_CARE_PROVIDER_SITE_OTHER): Payer: PRIVATE HEALTH INSURANCE | Admitting: Family Medicine

## 2016-10-29 VITALS — BP 127/65 | HR 66 | Wt 197.0 lb

## 2016-10-29 DIAGNOSIS — J189 Pneumonia, unspecified organism: Secondary | ICD-10-CM | POA: Diagnosis not present

## 2016-10-29 DIAGNOSIS — IMO0001 Reserved for inherently not codable concepts without codable children: Secondary | ICD-10-CM

## 2016-10-29 DIAGNOSIS — R937 Abnormal findings on diagnostic imaging of other parts of musculoskeletal system: Secondary | ICD-10-CM

## 2016-10-29 DIAGNOSIS — R911 Solitary pulmonary nodule: Secondary | ICD-10-CM | POA: Diagnosis not present

## 2016-10-29 DIAGNOSIS — D7589 Other specified diseases of blood and blood-forming organs: Secondary | ICD-10-CM

## 2016-10-29 NOTE — Patient Instructions (Signed)
Thank you for coming in today. Recheck in 6 weeks or so.  Return sooner if needed.  Go back to a cam walker boot as needed.  Use the carbon fiber plates.

## 2016-10-29 NOTE — Progress Notes (Signed)
       Fred Bond is a 66 y.o. male who presents to [name] today for follow-up foot pain and breathing.  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 Patient has been transitioning out of the cam walker boot and notes that his pain is improved. He is here to clinic today with to carbon fiber foot plates to fit into her shoe.  Breathing: Patient was seen last month for recurrent pneumonia.  As part of the workup he had a CT scan of his chest which showed some small pulmonary nodules. He notes he is feeling much better and denies any shortness of breath or wheezing.   Past Medical History:  Diagnosis Date  . Asthma 08/07/2012   Childhood   . History of iron deficiency 08/07/2012  . Recurrent pneumonia 08/07/2012  . Tear of meniscus of left knee 08/07/2012   No past surgical history on file. Social History  Substance Use Topics  . Smoking status: Never Smoker  . Smokeless tobacco: Never Used  . Alcohol use No   family history is not on file.  ROS as above:  Medications: Current Outpatient Prescriptions  Medication Sig Dispense Refill  . AMBULATORY NON FORMULARY MEDICATION flinstone vitamin    . aspirin 81 MG tablet Take 81 mg by mouth daily.    Marland Kitchen escitalopram (LEXAPRO) 20 MG tablet TAKE 1 TABLET (20 MG TOTAL) BY MOUTH DAILY. NEED TO ESTABLISH CARE WITH NEW PROVIDER. 30 tablet 0  . L-Lysine 500 MG TABS Take by mouth.    . Multiple Vitamins-Minerals (CENTRUM SILVER ULTRA MENS) TABS Take by mouth.    . niacin 500 MG tablet Take 500 mg by mouth daily with breakfast.    . sildenafil (REVATIO) 20 MG tablet 1-4 tabs by mouth as needed for intercourse. 50 tablet 11  . zoster vaccine live, PF, (ZOSTAVAX) 16109 UNT/0.65ML injection Inject 19,400 Units into the  skin once. 1 each 0   No current facility-administered medications for this visit.    No Known Allergies  Health Maintenance Health Maintenance  Topic Date Due  . Hepatitis C Screening  1950-11-03  . HIV Screening  12/26/1965  . INFLUENZA VACCINE  02/27/2017  . TETANUS/TDAP  03/30/2024  . COLONOSCOPY  06/29/2025     Exam:  BP 127/65   Pulse 66   Wt 197 lb (89.4 kg)   SpO2 93%   BMI 27.48 kg/m  Gen: Well NAD HEENT: EOMI,  MMM Lungs: Normal work of breathing. CTABL Heart: RRR no MRG Abd: NABS, Soft. Nondistended, Nontender Exts: Brisk capillary refill, warm and well perfused.  Right foot is nontender  No results found for this or any previous visit (from the past 72 hour(s)). No results found.    Assessment and Plan: 66 y.o. male with  Right foot pain due to bone marrow edema improving. Transition to regular shoe with fiber toe plate.  Recurrent pneumonia resolved watchful waiting.  Lung nodules. Plan for recheck CT scan in 3-6 months.   No orders of the defined types were placed in this encounter.  No orders of the defined types were placed in this encounter.    Discussed warning signs or symptoms. Please see discharge instructions. Patient expresses understanding.

## 2016-10-30 ENCOUNTER — Ambulatory Visit: Payer: PRIVATE HEALTH INSURANCE | Admitting: Family Medicine

## 2016-11-01 ENCOUNTER — Encounter: Payer: Self-pay | Admitting: Family Medicine

## 2016-11-01 ENCOUNTER — Ambulatory Visit (INDEPENDENT_AMBULATORY_CARE_PROVIDER_SITE_OTHER): Payer: PRIVATE HEALTH INSURANCE | Admitting: Family Medicine

## 2016-11-01 VITALS — BP 136/77 | HR 53 | Ht 71.0 in | Wt 196.0 lb

## 2016-11-01 DIAGNOSIS — L570 Actinic keratosis: Secondary | ICD-10-CM

## 2016-11-01 DIAGNOSIS — Z0000 Encounter for general adult medical examination without abnormal findings: Secondary | ICD-10-CM

## 2016-11-01 DIAGNOSIS — Z8639 Personal history of other endocrine, nutritional and metabolic disease: Secondary | ICD-10-CM | POA: Diagnosis not present

## 2016-11-01 DIAGNOSIS — E785 Hyperlipidemia, unspecified: Secondary | ICD-10-CM

## 2016-11-01 DIAGNOSIS — D7589 Other specified diseases of blood and blood-forming organs: Secondary | ICD-10-CM

## 2016-11-01 DIAGNOSIS — R937 Abnormal findings on diagnostic imaging of other parts of musculoskeletal system: Secondary | ICD-10-CM

## 2016-11-01 LAB — COMPLETE METABOLIC PANEL WITH GFR
ALT: 25 U/L (ref 9–46)
AST: 33 U/L (ref 10–35)
Albumin: 3.8 g/dL (ref 3.6–5.1)
Alkaline Phosphatase: 59 U/L (ref 40–115)
BILIRUBIN TOTAL: 0.5 mg/dL (ref 0.2–1.2)
BUN: 12 mg/dL (ref 7–25)
CALCIUM: 8.9 mg/dL (ref 8.6–10.3)
CHLORIDE: 104 mmol/L (ref 98–110)
CO2: 29 mmol/L (ref 20–31)
CREATININE: 0.87 mg/dL (ref 0.70–1.25)
GFR, Est Non African American: >89 mL/min (ref >=60)
Glucose, Bld: 83 mg/dL (ref 65–99)
Potassium: 4.1 mmol/L (ref 3.5–5.3)
Sodium: 139 mmol/L (ref 135–146)
TOTAL PROTEIN: 6.7 g/dL (ref 6.1–8.1)

## 2016-11-01 LAB — CBC
HEMATOCRIT: 41.6 % (ref 38.5–50.0)
HEMOGLOBIN: 14 g/dL (ref 13.2–17.1)
MCH: 32.2 pg (ref 27.0–33.0)
MCHC: 33.7 g/dL (ref 32.0–36.0)
MCV: 95.6 fL (ref 80.0–100.0)
MPV: 8.6 fL (ref 7.5–12.5)
Platelets: 201 10*3/uL (ref 140–400)
RBC: 4.35 MIL/uL (ref 4.20–5.80)
RDW: 14 % (ref 11.0–15.0)
WBC: 4.2 10*3/uL (ref 3.8–10.8)

## 2016-11-01 LAB — IRON AND TIBC
%SAT: 19 % (ref 15–60)
IRON: 74 ug/dL (ref 50–180)
TIBC: 394 ug/dL (ref 250–425)
UIBC: 320 ug/dL (ref 125–400)

## 2016-11-01 LAB — TSH: TSH: 0.96 mIU/L (ref 0.40–4.50)

## 2016-11-01 LAB — LIPID PANEL W/REFLEX DIRECT LDL
CHOLESTEROL: 230 mg/dL — AB (ref <200)
HDL: 79 mg/dL (ref >40)
LDL-CHOLESTEROL: 135 mg/dL — AB
NON-HDL CHOLESTEROL (CALC): 151 mg/dL — AB (ref <130)
TRIGLYCERIDES: 68 mg/dL (ref <150)
Total CHOL/HDL Ratio: 2.9 Ratio (ref <5.0)

## 2016-11-01 MED ORDER — ZOSTER VAC RECOMB ADJUVANTED 50 MCG/0.5ML IM SUSR: 0.5000 mL | Freq: Once | INTRAMUSCULAR | 1 refills | Status: AC

## 2016-11-01 NOTE — Patient Instructions (Addendum)
Thank you for coming in today. Get labs today.  I will contact you soon.  Return sooner if needed.  Get in touch about CT scan in September if we do not see each other before then.    Actinic Keratosis An actinic keratosis is a precancerous growth on the skin. This means that it could develop into skin cancer if it is not treated. About 1% of these growths (actinic keratoses) turn into skin cancer within one year if they are not treated. It is important to have all of these growths evaluated to determine the best treatment approach. What are the causes? This condition is caused by getting too much ultraviolet (UV) radiation from the sun or other UV light sources. What increases the risk? The following factors may make you more likely to develop this condition:  Having light-colored skin and blue eyes.  Having blonde or red hair.  Spending a lot of time in the sun.  Inadequate skin protection when outdoors. This may include:  Not using sunscreen properly.  Not covering up skin that is exposed to sunlight.  Aging. The risk of developing an actinic keratosis increases with age. What are the signs or symptoms? Actinic keratoses look like scaly, rough spots of skin.They can be as small as a pinhead or as big as a quarter. They may itch, hurt, or feel sensitive. In most cases, the growths become red. In some cases, they may be skin-colored, light tan, dark tan, pink, or a combination of any of these colors. There may be a small piece of pink or gray skin (skin tag) growing from the actinic keratosis. In some cases, it may be easier to notice actinic keratoses by feeling them, rather than seeing them. Actinic keratoses appear most often on areas of skin that get a lot of sun exposure, including the scalp, face, ears, lips, upper back, forearms, and the backs of the hands. Sometimes, actinic keratoses disappear, but many reappear a few days to a few weeks later. How is this diagnosed? This  condition is usually diagnosed with a physical exam. A tissue sample may be removed from the actinic keratosis and examined under a microscope (biopsy). How is this treated?   Treatment for this condition may include:  Scraping off the actinic keratosis (curettage).  Freezing the actinic keratosis with liquid nitrogen (cryosurgery). This causes the growth to eventually fall off the skin.  Applying medicated creams or gels to destroy the cells in the growth.  Applying chemicals to the actinic keratosis to make the outer layers of skin peel off (chemical peel).  Photodynamic therapy. In this procedure, medicated cream is applied to the actinic keratosis. This cream increases your skin's sensitivity to light. Then, a strong light is aimed at the actinic keratosis to destroy cells in the growth. Follow these instructions at home: Skin care   Apply cool, wet cloths (cool compresses) to the affected areas.  Do not scratch your skin.  Check your skin regularly for any growths, especially growths that:  Start to itch or bleed.  Change in size, shape, or color. Caring for the treated area   Keep the treated area clean and dry as told by your health care provider.  Do not apply any medicine, cream, or lotion to the treated area unless your health care provider tells you to do that.  Do not pick at blisters or try to break them open. This can cause infection and scarring.  If you have red or irritated skin after  treatment, follow instructions from your health care provider about how to take care of the treated area. Make sure you:  Wash your hands with soap and water before you change your bandage (dressing). If soap and water are not available, use hand sanitizer.  Change your dressing as told by your health care provider.  If you have red or irritated skin after treatment, check your treated area every day for signs of infection. Check for:  Swelling, pain, or more redness.  Fluid  or blood.  Warmth.  Pus or a bad smell. General instructions   Take over-the-counter and prescription medicines only as told by your health care provider.  Return to your normal activities as told by your health care provider. Ask your health care provider what activities are safe for you.  Do not use any tobacco products, such as cigarettes, chewing tobacco, and e-cigarettes. If you need help quitting, ask your health care provider.  Have a skin exam done every year by a health care provider who is a skin conditions specialist (dermatologist).  Keep all follow-up visits as told by your health care provider. This is important. How is this prevented?  Do not get sunburns.  Try to avoid the sun between 10:00 a.m. and 4:00 p.m. This is when the UV light is the strongest.  Use a sunscreen or sunblock with SPF 30 (sun protection factor 30) or greater.  Apply sunscreen before you are exposed to sunlight, and reapply periodically as often as directed by the instructions on the sunscreen container.  Always wear sunglasses that have UV protection, and always wear hats and clothing to protect your skin from sunlight.  When possible, avoid medicines that increase your sensitivity to sunlight. These include:  Certain antibiotic medicines.  Certain water pills (diuretics).  Certain prescription medicines that are used to treat acne (retinoids).  Do not use tanning beds or other indoor tanning devices. Contact a health care provider if:  You notice any changes or new growths on your skin.  You have swelling, pain, or more redness around your treated area.  You have fluid or blood coming from your treated area.  Your treated area feels warm to the touch.  You have pus or a bad smell coming from your treated area.  You have a fever.  You have a blister that becomes large and painful. This information is not intended to replace advice given to you by your health care provider. Make  sure you discuss any questions you have with your health care provider. Document Released: 10/12/2008 Document Revised: 03/16/2016 Document Reviewed: 03/26/2015 Elsevier Interactive Patient Education  2017 [employer].

## 2016-11-01 NOTE — Progress Notes (Signed)
Fred Bond is a 66 y.o. male who presents to [name] today for well exam.   Fred Bond is feeling well. He has had several recent health issues but overall is feeling pretty well. He notes that the cough has resolved. He is here today for basic labs and screening. He denies fevers chills vomiting or diarrhea. He takes medications listed below.   Past Medical History:  Diagnosis Date  . Asthma 08/07/2012   Childhood   . History of iron deficiency 08/07/2012  . Recurrent pneumonia 08/07/2012  . Tear of meniscus of left knee 08/07/2012   No past surgical history on file. Social History  Substance Use Topics  . Smoking status: Never Smoker  . Smokeless tobacco: Never Used  . Alcohol use No   family history is not on file.  ROS as above:  Medications: Current Outpatient Prescriptions  Medication Sig Dispense Refill  . AMBULATORY NON FORMULARY MEDICATION flinstone vitamin    . aspirin 81 MG tablet Take 81 mg by mouth daily.    Marland Kitchen escitalopram (LEXAPRO) 20 MG tablet TAKE 1 TABLET (20 MG TOTAL) BY MOUTH DAILY. NEED TO ESTABLISH CARE WITH NEW PROVIDER. 30 tablet 0  . L-Lysine 500 MG TABS Take by mouth.    . Multiple Vitamins-Minerals (CENTRUM SILVER ULTRA MENS) TABS Take by mouth.    . niacin 500 MG tablet Take 500 mg by mouth daily with breakfast.    . sildenafil (REVATIO) 20 MG tablet 1-4 tabs by mouth as needed for intercourse. 50 tablet 11  . Zoster Vac Recomb Adjuvanted [facility]) injection Inject 0.5 mLs into the muscle once. Give again after 2 months. If administered in clinic please fax report to Dr Denyse Amass [phone] 1 each 1   No current facility-administered medications for this visit.    No Known Allergies  Health Maintenance Health Maintenance  Topic Date Due  . INFLUENZA VACCINE  02/27/2017  . TETANUS/TDAP  03/30/2024  . COLONOSCOPY  06/29/2025    . Hepatitis C Screening  Completed  . HIV Screening  Completed     Exam:  BP 136/77   Pulse (!) 53   Ht  (1.803 m)   Wt 196 lb (88.9 kg)   BMI 27.34 kg/m  Gen: Well NAD HEENT: EOMI,  MMM Lungs: Normal work of breathing. CTABL Heart: RRR no MRG Abd: NABS, Soft. Nondistended, Nontender Exts: Brisk capillary refill, warm and well perfused.  Skin: Small scaly 2 mm lesion right forearm consistent with actinic keratosis  Cryotherapy right arm: Consent obtained and timeout performed. The small scaly lesion on the right forearm was treated with cryotherapy achieving a good frost Circle for 20 seconds 2. Patient tolerated procedure well.    No results found for this or any previous visit (from the past 72 hour(s)). No results found.    Assessment and Plan: 66 y.o. male with  Well adult doing well. Obtain basic fasting labs. Screening up-to-date. Shingles vaccine ordered.   Orders Placed This Encounter  Procedures  . CBC  . COMPLETE METABOLIC PANEL WITH GFR  . Lipid Panel w/reflex Direct LDL  . TSH  . PSA  . Iron and TIBC  . Ferritin  . VITAMIN D 25 Hydroxy (Vit-D Deficiency, Fractures)   Meds ordered this encounter  Medications  . Zoster Vac Recomb Adjuvanted [facility]) injection    Sig: Inject 0.5 mLs into the muscle once. Give again after 2 months. If administered in clinic  please fax report to Dr Denyse Amass [phone]    Dispense:  1 each    Refill:  1     Discussed warning signs or symptoms. Please see discharge instructions. Patient expresses understanding.

## 2016-11-02 LAB — FERRITIN: FERRITIN: 14 ng/mL — AB (ref 20–380)

## 2016-11-02 LAB — VITAMIN D 25 HYDROXY (VIT D DEFICIENCY, FRACTURES): Vit D, 25-Hydroxy: 40 ng/mL (ref 30–100)

## 2016-11-02 LAB — PSA: PSA: 1.9 ng/mL (ref <=4.0)

## 2016-11-02 MED ORDER — PRAVASTATIN SODIUM 40 MG PO TABS: 40.0000 mg | ORAL_TABLET | Freq: Every day | ORAL | 0 refills | Status: DC

## 2016-11-02 NOTE — Addendum Note (Signed)
Addended by: Rodolph Bong on: 11/02/2016 07:42 AM   Modules accepted: Orders

## 2016-11-26 ENCOUNTER — Other Ambulatory Visit: Payer: Self-pay | Admitting: Family Medicine

## 2016-12-11 ENCOUNTER — Ambulatory Visit (INDEPENDENT_AMBULATORY_CARE_PROVIDER_SITE_OTHER): Payer: PRIVATE HEALTH INSURANCE | Admitting: Family Medicine

## 2016-12-11 ENCOUNTER — Encounter: Payer: Self-pay | Admitting: Family Medicine

## 2016-12-11 VITALS — BP 129/76 | HR 64 | Ht 71.0 in | Wt 197.0 lb

## 2016-12-11 DIAGNOSIS — D7589 Other specified diseases of blood and blood-forming organs: Secondary | ICD-10-CM

## 2016-12-11 DIAGNOSIS — R937 Abnormal findings on diagnostic imaging of other parts of musculoskeletal system: Secondary | ICD-10-CM

## 2016-12-11 NOTE — Progress Notes (Signed)
Fred Bond is a 66 y.o. male who [facility] today for follow-up foot pain. Patient presents to clinic for recurrent right lateral foot pain. This was originally seen about 6 months ago.  He was thought to have a stress fracture and ultimately had an MRI of his right foot which showed significant edema of the metatarsals and tarsal bones along with a lateral rays of the foot.  This was associated with significant decrease in the articular cartilage.  He was treated with rigid immobilization with a walking boot and transitioned into a postoperative shoe.  Subsequently he was transitioned to a carbon fiber sole plate for his shoe. He notes he continues to have pain especially on the lateral aspect of his foot and midfoot with walking.   Past Medical History:  Diagnosis Date  . Asthma 08/07/2012   Childhood   . History of iron deficiency 08/07/2012  . Recurrent pneumonia 08/07/2012  . Tear of meniscus of left knee 08/07/2012   No past surgical history on file. Social History  Substance Use Topics  . Smoking status: Never Smoker  . Smokeless tobacco: Never Used  . Alcohol use No     ROS:  As above   Medications: Current Outpatient Prescriptions  Medication Sig Dispense Refill  . acetaminophen (TYLENOL) 500 MG tablet Take 1,000 mg by mouth daily.    . AMBULATORY NON FORMULARY MEDICATION flinstone vitamin    . aspirin 81 MG tablet Take 81 mg by mouth daily.    Marland Kitchen. escitalopram (LEXAPRO) 20 MG tablet Take 1 tablet (20 mg total) by mouth daily. 30 tablet 0  . L-Lysine 500 MG TABS Take by mouth.    . Multiple Vitamins-Minerals (CENTRUM SILVER ULTRA MENS) TABS Take by mouth.    . naproxen sodium (ANAPROX) 220 MG tablet Take 2 tablets by mouth daily.    . niacin 500 MG tablet Take 500 mg by mouth daily with breakfast.    . pravastatin (PRAVACHOL) 40 MG tablet Take 1 tablet (40 mg total) by mouth daily. 90 tablet 0  . sildenafil  (REVATIO) 20 MG tablet 1-4 tabs by mouth as needed for intercourse. 50 tablet 11   No current facility-administered medications for this visit.    No Known Allergies   Exam:  BP 129/76   Pulse 64   Ht 5\' 11"  (1.803 m)   Wt 197 lb (89.4 kg)   SpO2 98%   BMI 27.48 kg/m  General: Well Developed, well nourished, and in no acute distress.  Neuro/Psych: Alert and oriented x3, extra-ocular muscles intact, able to move all 4 extremities, sensation grossly intact. Skin: Warm and dry, no rashes noted.  Respiratory: Not using accessory muscles, speaking in full sentences, trachea midline.  Cardiovascular: Pulses palpable, no extremity edema. Abdomen: Does not appear distended. MSK: Right foot unremarkable appearing tender palpation along the lateral midfoot.   Study Result   CLINICAL DATA:  Lateral right foot pain since an injury running one year ago. No recent injury.  EXAM: MRI OF THE RIGHT ANKLE WITHOUT CONTRAST  TECHNIQUE: Multiplanar, multisequence MR imaging of the right ankle was performed. No intravenous contrast was administered.  COMPARISON:  Plain films right foot 07/10/2016.  FINDINGS: Bones/Joint/Cartilage  There is intense marrow edema throughout the cuboid bone and majority of the fourth and fifth metatarsals. Marked fourth and fifth tarsometatarsal joint space narrowing is present with subchondral cyst formation and osteophytosis. Edema in the navicular and a subchondral cyst are identified  at its articulation with the medial cuneiform. No fracture is seen.  Ligaments  Intact.  Muscles and Tendons  Intact.  Soft tissues  Subcutaneous edema is seen adjacent to the fourth and fifth metatarsals.  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 Degenerative change is worse at the  fourth TMT joint. No displaced fracture is seen.  Edema in navicular centered at its articulation with the medial cuneiform is also like related to osteoarthritis.  Negative for tendon or ligament tear.   Electronically Signed   By: Fred Bond M.D.   On: 09/03/2016 09:39      No results found for this or any previous visit (from the past 48 hour(s)). No results found.    Assessment and Plan: 66 y.o. male with persistent pain along the lateral foot likely related to osteoarthritis and bone edema. At this point patient is failing conservative management. Aside from further immobilization I am not  sure what else there is to do. He may benefit from fusion of the midfoot along the areas that are very degenerative. I'm concerned about doing a steroid injection as this may increase his chance of developing avascular necrosis of the stones.  We discussed other options including PRP injection although there really is no evidence for that in this area. It may be something to try before a large surgery.  Plan to refer to Dr Fred Bond. I appreciate his feedback on this. Is there any other conservative management aside from a walking cast to try prior to potential fusion.    Orders Placed This Encounter  Procedures  . Ambulatory referral to Orthopedic Surgery    Referral Priority:   Routine    Referral Type:   Surgical    Referral Reason:   Specialty Services Required    Requested Specialty:   Orthopedic Surgery    Number of Visits Requested:   1    Discussed warning signs or symptoms. Please see discharge instructions. Patient expresses understanding.

## 2016-12-11 NOTE — Patient Instructions (Signed)
Thank you for coming in today. I recommend that you see Dr Victorino DikeHewitt as a consultation.  See what he has to say.  Recheck as needed.

## 2017-01-14 ENCOUNTER — Other Ambulatory Visit: Payer: Self-pay | Admitting: Family Medicine

## 2017-01-14 ENCOUNTER — Other Ambulatory Visit: Payer: Self-pay

## 2017-01-14 MED ORDER — ESCITALOPRAM OXALATE 20 MG PO TABS: 20.0000 mg | ORAL_TABLET | Freq: Every day | ORAL | 0 refills | Status: DC

## 2017-01-28 ENCOUNTER — Other Ambulatory Visit: Payer: Self-pay | Admitting: Family Medicine

## 2017-02-12 ENCOUNTER — Ambulatory Visit (INDEPENDENT_AMBULATORY_CARE_PROVIDER_SITE_OTHER): Payer: PRIVATE HEALTH INSURANCE | Admitting: Family Medicine

## 2017-02-12 ENCOUNTER — Encounter: Payer: Self-pay | Admitting: Family Medicine

## 2017-02-12 ENCOUNTER — Other Ambulatory Visit: Payer: Self-pay | Admitting: Family Medicine

## 2017-02-12 VITALS — BP 146/72 | HR 57 | Ht 71.0 in | Wt 196.0 lb

## 2017-02-12 DIAGNOSIS — F439 Reaction to severe stress, unspecified: Secondary | ICD-10-CM | POA: Diagnosis not present

## 2017-02-12 DIAGNOSIS — F320 Major depressive disorder, single episode, mild: Secondary | ICD-10-CM | POA: Diagnosis not present

## 2017-02-12 DIAGNOSIS — E785 Hyperlipidemia, unspecified: Secondary | ICD-10-CM

## 2017-02-12 DIAGNOSIS — Z8639 Personal history of other endocrine, nutritional and metabolic disease: Secondary | ICD-10-CM

## 2017-02-12 MED ORDER — ESCITALOPRAM OXALATE 20 MG PO TABS: 20.0000 mg | ORAL_TABLET | Freq: Every day | ORAL | 1 refills | Status: DC

## 2017-02-12 NOTE — Patient Instructions (Signed)
Thank you for coming in today. Get labs fasting soon.  Continue current medicines.  We may change things based on results.   Let me know if stress worseness.  .Marland Kitchen

## 2017-02-12 NOTE — Progress Notes (Signed)
Fred Bond is a 66 y.o. male who [name] today for follow-up anxiety and depression, and hyperlipidemia.  Anxiety and depression: Patient has a history of depression and anxiety in the past. This is typically quite well controlled with Lexapro 20 mg daily. He notes however the stress in his life has worsened. His wife was relatively recently diagnosed with breast cancer. She has had a double mastectomy and is currently receiving chemotherapy for her stage III lymph node positive estrogen receptor positive breast cancer. Once chemotherapy is done and she is scheduled for radiation therapy as well.  This obviously has dramatically increased the stress in Fred Bond's life. Additionally his kitchen is currently being remodeled and he has a puppy at home. These are all life stressors. He notes he is suffering from worsening symptoms. Overall he is currently still able to go to work and take care of his family but he notes he is more stressed.  Hyperlipidemia: Patient was started on pravastatin about 3 months ago. He is tolerating the medication well with no significant increased muscle aches or pains.   Past Medical History:  Diagnosis Date  . Asthma 08/07/2012   Childhood   . History of iron deficiency 08/07/2012  . Recurrent pneumonia 08/07/2012  . Tear of meniscus of left knee 08/07/2012   No past surgical history on file. Social History  Substance Use Topics  . Smoking status: Never Smoker  . Smokeless tobacco: Never Used  . Alcohol use No   family history includes Alcohol abuse in his unknown relative; Heart attack in his unknown relative.  ROS as above:  Medications: Current Outpatient Prescriptions  Medication Sig Dispense Refill  . acetaminophen (TYLENOL) 500 MG tablet Take 1,000 mg by mouth daily.    . AMBULATORY NON FORMULARY MEDICATION flinstone  vitamin    . aspirin 81 MG tablet Take 81 mg by mouth daily.    Marland Kitchen. escitalopram (LEXAPRO) 20 MG tablet Take 1 tablet (20 mg total) by mouth daily. 90 tablet 1  . L-Lysine 500 MG TABS Take by mouth.    . Multiple Vitamins-Minerals (CENTRUM SILVER ULTRA MENS) TABS Take by mouth.    . naproxen sodium (ANAPROX) 220 MG tablet Take 2 tablets by mouth daily.    . niacin 500 MG tablet Take 500 mg by mouth daily with breakfast.    . pravastatin (PRAVACHOL) 40 MG tablet TAKE 1 TABLET (40 MG TOTAL) BY MOUTH DAILY. 90 tablet 0  . sildenafil (REVATIO) 20 MG tablet 1-4 tabs by mouth as needed for intercourse. 50 tablet 11   No current facility-administered medications for this visit.    No Known Allergies  Health Maintenance Health Maintenance  Topic Date Due  . INFLUENZA VACCINE  02/27/2017  . TETANUS/TDAP  03/30/2024  . COLONOSCOPY  06/29/2025  . Hepatitis C Screening  Completed  . HIV Screening  Completed     Exam:  BP (!) 146/72   Pulse (!) 57   Ht 5\' 11"  (1.803 m)   Wt 196 lb (88.9 kg)   BMI 27.34 kg/m  Gen: Well NAD HEENT: EOMI,  MMM Lungs: Normal work of breathing. CTABL Heart: RRR no MRG Abd: NABS, Soft. Nondistended, Nontender Exts: Brisk capillary refill, warm and well perfused.  Psych: Alert and oriented normal speech and thought process. Affect is somewhat tearful at times. No SI or HI expressed. Depression screen PHQ 2/9 02/12/2017  Decreased Interest 1  Down, Depressed,  Hopeless 1  PHQ - 2 Score 2  Altered sleeping 1  Tired, decreased energy 2  Change in appetite 0  Feeling bad or failure about yourself  0  Trouble concentrating 0  Moving slowly or fidgety/restless 0  Suicidal thoughts 0  PHQ-9 Score 5   GAD 7 : Generalized Anxiety Score 02/12/2017  Nervous, Anxious, on Edge 1  Control/stop worrying 2  Worry too much - different things 1  Trouble relaxing 2  Restless 0  Easily annoyed or irritable 1  Afraid - awful might happen 1  Total GAD 7 Score 8  Anxiety  Difficulty Somewhat difficult      No results found for this or any previous visit (from the past 72 hour(s)). No results found.    Assessment and Plan: 66 y.o. male with  Anxiety/Depression: Doing OK currently. Plan to keep an eye on symptoms. Continue Lexapro and consider therapy. Recheck in the future PRN.   HLD: Will recheck fasting labs and likely continue pravastatin.   History of iron deficiency: Recheck CBC and Ferritin.   Orders Placed This Encounter  Procedures  . CBC  . COMPLETE METABOLIC PANEL WITH GFR  . Lipid Panel w/reflex Direct LDL  . Ferritin   Meds ordered this encounter  Medications  . escitalopram (LEXAPRO) 20 MG tablet    Sig: Take 1 tablet (20 mg total) by mouth daily.    Dispense:  90 tablet    Refill:  1     Discussed warning signs or symptoms. Please see discharge instructions. Patient expresses understanding.

## 2017-02-13 ENCOUNTER — Ambulatory Visit: Payer: PRIVATE HEALTH INSURANCE | Admitting: Family Medicine

## 2017-02-15 LAB — CBC
HCT: 39.8 % (ref 38.5–50.0)
Hemoglobin: 13.2 g/dL (ref 13.2–17.1)
MCH: 32.9 pg (ref 27.0–33.0)
MCHC: 33.2 g/dL (ref 32.0–36.0)
MCV: 99.3 fL (ref 80.0–100.0)
MPV: 9 fL (ref 7.5–12.5)
PLATELETS: 225 10*3/uL (ref 140–400)
RBC: 4.01 MIL/uL — AB (ref 4.20–5.80)
RDW: 13.9 % (ref 11.0–15.0)
WBC: 5.1 10*3/uL (ref 3.8–10.8)

## 2017-02-16 LAB — LIPID PANEL W/REFLEX DIRECT LDL
Cholesterol: 152 mg/dL (ref <200)
HDL: 59 mg/dL (ref >40)
LDL-CHOLESTEROL: 79 mg/dL
NON-HDL CHOLESTEROL (CALC): 93 mg/dL (ref <130)
TRIGLYCERIDES: 56 mg/dL (ref <150)
Total CHOL/HDL Ratio: 2.6 Ratio (ref <5.0)

## 2017-02-16 LAB — COMPLETE METABOLIC PANEL WITH GFR
ALT: 25 U/L (ref 9–46)
AST: 31 U/L (ref 10–35)
Albumin: 3.7 g/dL (ref 3.6–5.1)
Alkaline Phosphatase: 66 U/L (ref 40–115)
BUN: 13 mg/dL (ref 7–25)
CHLORIDE: 107 mmol/L (ref 98–110)
CO2: 23 mmol/L (ref 20–31)
CREATININE: 0.9 mg/dL (ref 0.70–1.25)
Calcium: 8.7 mg/dL (ref 8.6–10.3)
GFR, Est Non African American: >89 mL/min (ref >=60)
GLUCOSE: 80 mg/dL (ref 65–99)
Potassium: 4 mmol/L (ref 3.5–5.3)
SODIUM: 141 mmol/L (ref 135–146)
Total Bilirubin: 0.5 mg/dL (ref 0.2–1.2)
Total Protein: 6.2 g/dL (ref 6.1–8.1)

## 2017-02-16 LAB — FERRITIN: FERRITIN: 14 ng/mL — AB (ref 20–380)

## 2017-03-12 ENCOUNTER — Encounter: Payer: Self-pay | Admitting: Family Medicine

## 2017-03-12 ENCOUNTER — Ambulatory Visit (INDEPENDENT_AMBULATORY_CARE_PROVIDER_SITE_OTHER): Payer: PRIVATE HEALTH INSURANCE | Admitting: Family Medicine

## 2017-03-12 VITALS — BP 136/79 | HR 63 | Wt 194.0 lb

## 2017-03-12 DIAGNOSIS — M1712 Unilateral primary osteoarthritis, left knee: Secondary | ICD-10-CM | POA: Diagnosis not present

## 2017-03-12 NOTE — Progress Notes (Signed)
       Fred Bond is a 66 y.o. male who [name] Primary [facility] Medicine today for left knee pain. Patient has ongoing left knee pain. He notes the pain worsened recently with swelling. He will need an appointment but has subsequently been seen by orthopedic surgeon who notes severe DJD and recommended total joint replacement. He's tried over-the-counter medications which have helped some as well as topical diclofenac gel which has helped a little. He's had several injections also which have not helped much.   Past Medical History:  Diagnosis Date  . Asthma 08/07/2012   Childhood   . History of iron deficiency 08/07/2012  . Recurrent pneumonia 08/07/2012  . Tear of meniscus of left knee 08/07/2012   No past surgical history on file. Social History  Substance Use Topics  . Smoking status: Never Smoker  . Smokeless tobacco: Never Used  . Alcohol use No   family history includes Alcohol abuse in his unknown relative; Heart attack in his unknown relative.  ROS as above:  Medications: Current Outpatient Prescriptions  Medication Sig Dispense Refill  . acetaminophen (TYLENOL) 500 MG tablet Take 1,000 mg by mouth daily.    . AMBULATORY NON FORMULARY MEDICATION flinstone vitamin    . aspirin 81 MG tablet Take 81 mg by mouth daily.    Marland Kitchen. escitalopram (LEXAPRO) 20 MG tablet Take 1 tablet (20 mg total) by mouth daily. 90 tablet 1  . L-Lysine 500 MG TABS Take by mouth.    . Multiple Vitamins-Minerals (CENTRUM SILVER ULTRA MENS) TABS Take by mouth.    . naproxen sodium (ANAPROX) 220 MG tablet Take 2 tablets by mouth daily.    . niacin 500 MG tablet Take 500 mg by mouth daily with breakfast.    . pravastatin (PRAVACHOL) 40 MG tablet TAKE 1 TABLET (40 MG TOTAL) BY MOUTH DAILY. 90 tablet 0  . sildenafil (REVATIO) 20 MG tablet 1-4 tabs by mouth as needed for intercourse. 50 tablet 11   No  current facility-administered medications for this visit.    No Known Allergies  Health Maintenance Health Maintenance  Topic Date Due  . INFLUENZA VACCINE  02/27/2017  . TETANUS/TDAP  03/30/2024  . COLONOSCOPY  06/29/2025  . Hepatitis C Screening  Completed  . HIV Screening  Completed     Exam:  BP 136/79   Pulse 63   Wt 194 lb (88 kg)   BMI 27.06 kg/m  Gen: Well NAD HEENT: EOMI,  MMM Lungs: Normal work of breathing. CTABL Heart: RRR no MRG Abd: NABS, Soft. Nondistended, Nontender Exts: Brisk capillary refill, warm and well perfused.  Left knee moderate effusion no erythema. Range of motion 5-120. Crepitations on extension. Stable ligamentous exam.  X-ray dated September 2017 showing significant DJD reviewed   No results found for this or any previous visit (from the past 72 hour(s)). No results found.    Assessment and Plan: 66 y.o. male with left knee pain due to advanced DJD. Agree with orthopedic surgery for total joint replacement. In the meantime topical diclofenac gel. I'm happy to do an aspiration and injection for temporary control of pain as needed.   I see no medical reason why patient cannot have surgery.    No orders of the defined types were placed in this encounter.  No orders of the defined types were placed in this encounter.    Discussed warning signs or symptoms. Please see discharge instructions. Patient expresses understanding.

## 2017-03-12 NOTE — Patient Instructions (Signed)
Thank you for coming in today. Let me know about the injection.  We can double book you for injection if needed this week.   I do recommend a knee replacement with Dr Turner Danielsowan.

## 2017-03-19 ENCOUNTER — Ambulatory Visit: Payer: PRIVATE HEALTH INSURANCE | Admitting: Family Medicine

## 2017-03-20 ENCOUNTER — Encounter: Payer: Self-pay | Admitting: Family Medicine

## 2017-03-20 ENCOUNTER — Ambulatory Visit (INDEPENDENT_AMBULATORY_CARE_PROVIDER_SITE_OTHER): Payer: PRIVATE HEALTH INSURANCE | Admitting: Family Medicine

## 2017-03-20 VITALS — BP 140/80 | HR 51 | Wt 194.0 lb

## 2017-03-20 DIAGNOSIS — H35033 Hypertensive retinopathy, bilateral: Secondary | ICD-10-CM

## 2017-03-20 DIAGNOSIS — F439 Reaction to severe stress, unspecified: Secondary | ICD-10-CM | POA: Diagnosis not present

## 2017-03-20 DIAGNOSIS — H33313 Horseshoe tear of retina without detachment, bilateral: Secondary | ICD-10-CM | POA: Diagnosis not present

## 2017-03-20 DIAGNOSIS — F320 Major depressive disorder, single episode, mild: Secondary | ICD-10-CM | POA: Diagnosis not present

## 2017-03-20 HISTORY — DX: Hypertensive retinopathy, bilateral: H35.033

## 2017-03-20 MED ORDER — DICLOFENAC SODIUM 1 % TD GEL: 4.0000 g | Freq: Four times a day (QID) | TRANSDERMAL | 11 refills | Status: DC

## 2017-03-20 NOTE — Progress Notes (Signed)
Fred Bond is a 66 y.o. male who presents to [facility]: Primary Care Sports Medicine today for follow-up mood and knee pain and blood pressure.  Patient was seen by his optometrist yesterday. He has a history of retinal tear a vitreous detachment. He's doing pretty well but on exam I exam he was found to have hypertensive retinopathy. He denies a personal history of hypertension and denies chest pain palpitations or shortness of breath. He feels well otherwise. He notes that he does take ibuprofen and naproxen almost every day. He denies chest pain palpitations or shortness of breath.  Mood: Patient notes improvement overall. His wife is currently battling cancer. He takes Lexapro daily. He denies any SI or HI.  Knee pain: Patient notes improvement in pain. He's been using diclofenac gel which has helped significantly. He thinks he'll schedule his knee replacement for later this year.   Past Medical History:  Diagnosis Date  . Asthma 08/07/2012   Childhood   . History of iron deficiency 08/07/2012  . Hypertensive retinopathy of both eyes 03/20/2017   Seen on eye exam August 2018  . Recurrent pneumonia 08/07/2012  . Tear of meniscus of left knee 08/07/2012   No past surgical history on file. Social History  Substance Use Topics  . Smoking status: Never Smoker  . Smokeless tobacco: Never Used  . Alcohol use No   family history includes Alcohol abuse in his unknown relative; Heart attack in his unknown relative.  ROS as above:  Medications: Current Outpatient Prescriptions  Medication Sig Dispense Refill  . acetaminophen (TYLENOL) 500 MG tablet Take 1,000 mg by mouth daily.    . AMBULATORY NON FORMULARY MEDICATION flinstone vitamin    . aspirin 81 MG tablet Take 81 mg by mouth daily.    Marland Kitchen escitalopram (LEXAPRO) 20 MG tablet Take 1 tablet (20 mg total) by mouth daily. 90 tablet 1  .  L-Lysine 500 MG TABS Take by mouth.    . Multiple Vitamins-Minerals (CENTRUM SILVER ULTRA MENS) TABS Take by mouth.    . naproxen sodium (ANAPROX) 220 MG tablet Take 2 tablets by mouth daily.    . niacin 500 MG tablet Take 500 mg by mouth daily with breakfast.    . pravastatin (PRAVACHOL) 40 MG tablet TAKE 1 TABLET (40 MG TOTAL) BY MOUTH DAILY. 90 tablet 0  . sildenafil (REVATIO) 20 MG tablet 1-4 tabs by mouth as needed for intercourse. 50 tablet 11  . diclofenac sodium (VOLTAREN) 1 % GEL Apply 4 g topically 4 (four) times daily. To affected joint. 100 g 11   No current facility-administered medications for this visit.    No Known Allergies  Health Maintenance Health Maintenance  Topic Date Due  . INFLUENZA VACCINE  05/20/2017 (Originally 02/27/2017)  . TETANUS/TDAP  03/30/2024  . COLONOSCOPY  06/29/2025  . Hepatitis C Screening  Completed  . HIV Screening  Completed     Exam:  BP 140/80   Pulse (!) 51   Wt 194 lb (88 kg)   BMI 27.06 kg/m  Gen: Well NAD HEENT: EOMI,  MMM Lungs: Normal work of breathing. CTABL Heart: Bradycardia rate normal rhythm no MRG Abd: NABS, Soft. Nondistended, Nontender Exts: Brisk capillary refill, warm and well perfused.  Left knee: Moderate effusion nontender normal motion. Crepitations throughout.  Psych: Alert and oriented normal speech thought process and affect. No SI or HI expressed.  Depression screen [facility] 2/9 03/20/2017 02/12/2017  Decreased Interest 0 1  Down, Depressed, Hopeless 1 1  PHQ - 2 Score 1 2  Altered sleeping 1 1  Tired, decreased energy 1 2  Change in appetite 0 0  Feeling bad or failure about yourself  0 0  Trouble concentrating 0 0  Moving slowly or fidgety/restless 0 0  Suicidal thoughts 0 0  PHQ-9 Score 3 5   GAD 7 : Generalized Anxiety Score 03/20/2017 02/12/2017  Nervous, Anxious, on Edge 1 1  Control/stop worrying 1 2  Worry too much - different things 1 1  Trouble relaxing 1 2  Restless 0 0  Easily annoyed or  irritable 0 1  Afraid - awful might happen 0 1  Total GAD 7 Score 4 8  Anxiety Difficulty - Somewhat difficult       No results found for this or any previous visit (from the past 72 hour(s)). No results found.    Assessment and Plan: 66 y.o. male with  Hypertension with retinopathy: Blood pressure is mildly elevated and has been so for some time. Patient is taking high-dose NSAIDs for knee pain. Plan to stop oral NSAIDs and use Tylenol instead. If this doesn't work next step would be probably lisinopril or hydrochlorothiazide. Patient would like to avoid taking more medication if possible.  Knee pain: Due to DJD. I recommend patient schedule total knee replacement in the near future.  Mood: Stable continue current regimen. We'll continue to follow along.   No orders of the defined types were placed in this encounter.  Meds ordered this encounter  Medications  . diclofenac sodium (VOLTAREN) 1 % GEL    Sig: Apply 4 g topically 4 (four) times daily. To affected joint.    Dispense:  100 g    Refill:  11    CC: Fred Bond OD [address] [place] [address]  Discussed warning signs or symptoms. Please see discharge instructions. Patient expresses understanding.

## 2017-03-20 NOTE — Patient Instructions (Signed)
Thank you for coming in today. STOP Aleve and Ibuprofen as a baseline for pain.  Try using them only if tylenol does not work.  You can take up to 1000mg  of tylenol every 6 hours for pain as needed.   Use voltaren gel   We will continue to follow the pressure.   Check home pressures as well with a automatic blood pressure cuff.  Do not get the wrist ones.  We will continue to follow.

## 2017-03-20 NOTE — Progress Notes (Signed)
Note sent to requested recipient via epic.  

## 2017-04-22 ENCOUNTER — Encounter (HOSPITAL_COMMUNITY)
Admission: RE | Admit: 2017-04-22 | Discharge: 2017-04-22 | Disposition: A | Discharge status: Home / Self Care | Payer: PRIVATE HEALTH INSURANCE | Source: Ambulatory Visit | Attending: Orthopedic Surgery | Admitting: Orthopedic Surgery

## 2017-04-22 ENCOUNTER — Encounter (HOSPITAL_COMMUNITY): Payer: Self-pay

## 2017-04-22 DIAGNOSIS — E785 Hyperlipidemia, unspecified: Secondary | ICD-10-CM | POA: Diagnosis not present

## 2017-04-22 DIAGNOSIS — R001 Bradycardia, unspecified: Secondary | ICD-10-CM | POA: Diagnosis not present

## 2017-04-22 DIAGNOSIS — Z01818 Encounter for other preprocedural examination: Secondary | ICD-10-CM | POA: Insufficient documentation

## 2017-04-22 DIAGNOSIS — Z79899 Other long term (current) drug therapy: Secondary | ICD-10-CM | POA: Diagnosis not present

## 2017-04-22 DIAGNOSIS — D509 Iron deficiency anemia, unspecified: Secondary | ICD-10-CM | POA: Diagnosis not present

## 2017-04-22 DIAGNOSIS — M1712 Unilateral primary osteoarthritis, left knee: Secondary | ICD-10-CM | POA: Insufficient documentation

## 2017-04-22 DIAGNOSIS — Z7982 Long term (current) use of aspirin: Secondary | ICD-10-CM | POA: Diagnosis not present

## 2017-04-22 DIAGNOSIS — Z01812 Encounter for preprocedural laboratory examination: Secondary | ICD-10-CM | POA: Diagnosis not present

## 2017-04-22 DIAGNOSIS — Z0183 Encounter for blood typing: Secondary | ICD-10-CM | POA: Insufficient documentation

## 2017-04-22 HISTORY — DX: Infectious mononucleosis, unspecified without complication: B27.90

## 2017-04-22 HISTORY — DX: Osteoarthritis of knee, unspecified: M17.9

## 2017-04-22 HISTORY — DX: Depression, unspecified: F32.A

## 2017-04-22 HISTORY — DX: Major depressive disorder, single episode, unspecified: F32.9

## 2017-04-22 HISTORY — DX: Headache: R51

## 2017-04-22 HISTORY — DX: Unilateral primary osteoarthritis, unspecified knee: M17.10

## 2017-04-22 HISTORY — DX: Headache, unspecified: R51.9

## 2017-04-22 HISTORY — DX: Pure hypercholesterolemia, unspecified: E78.00

## 2017-04-22 LAB — PROTIME-INR
INR: 1
PROTHROMBIN TIME: 13.1 s (ref 11.4–15.2)

## 2017-04-22 LAB — URINALYSIS, ROUTINE W REFLEX MICROSCOPIC
Bilirubin Urine: NEGATIVE
GLUCOSE, UA: NEGATIVE mg/dL
Hgb urine dipstick: NEGATIVE
Ketones, ur: NEGATIVE mg/dL
LEUKOCYTES UA: NEGATIVE
Nitrite: NEGATIVE
PH: 6 (ref 5.0–8.0)
Protein, ur: NEGATIVE mg/dL
Specific Gravity, Urine: 1.004 — ABNORMAL LOW (ref 1.005–1.030)

## 2017-04-22 LAB — CBC WITH DIFFERENTIAL/PLATELET
BASOS PCT: 1 %
Basophils Absolute: 0 10*3/uL (ref 0.0–0.1)
EOS ABS: 0.2 10*3/uL (ref 0.0–0.7)
Eosinophils Relative: 3 %
HCT: 41.9 % (ref 39.0–52.0)
Hemoglobin: 14.5 g/dL (ref 13.0–17.0)
Lymphocytes Relative: 34 %
Lymphs Abs: 2 10*3/uL (ref 0.7–4.0)
MCH: 33.1 pg (ref 26.0–34.0)
MCHC: 34.6 g/dL (ref 30.0–36.0)
MCV: 95.7 fL (ref 78.0–100.0)
MONO ABS: 0.7 10*3/uL (ref 0.1–1.0)
MONOS PCT: 12 %
NEUTROS PCT: 50 %
Neutro Abs: 3 10*3/uL (ref 1.7–7.7)
Platelets: 194 10*3/uL (ref 150–400)
RBC: 4.38 MIL/uL (ref 4.22–5.81)
RDW: 13.1 % (ref 11.5–15.5)
WBC: 5.8 10*3/uL (ref 4.0–10.5)

## 2017-04-22 LAB — TYPE AND SCREEN
ABO/RH(D): O POS
Antibody Screen: NEGATIVE

## 2017-04-22 LAB — BASIC METABOLIC PANEL
Anion gap: 7 (ref 5–15)
BUN: 10 mg/dL (ref 6–20)
CHLORIDE: 99 mmol/L — AB (ref 101–111)
CO2: 29 mmol/L (ref 22–32)
CREATININE: 0.87 mg/dL (ref 0.61–1.24)
Calcium: 9.2 mg/dL (ref 8.9–10.3)
GFR calc Af Amer: >60 mL/min (ref >60)
GFR calc non Af Amer: >60 mL/min (ref >60)
GLUCOSE: 79 mg/dL (ref 65–99)
POTASSIUM: 4.1 mmol/L (ref 3.5–5.1)
Sodium: 135 mmol/L (ref 135–145)

## 2017-04-22 LAB — SURGICAL PCR SCREEN
MRSA, PCR: NEGATIVE
Staphylococcus aureus: POSITIVE — AB

## 2017-04-22 LAB — APTT: aPTT: 39 seconds — ABNORMAL HIGH (ref 24–36)

## 2017-04-22 LAB — ABO/RH: ABO/RH(D): O POS

## 2017-04-22 NOTE — Pre-Procedure Instructions (Addendum)
    Fred Bond  04/22/2017      [id number] - [place], [place] - [address] [place] [address] Phone: [phone] Fax: [phone]    Your procedure is scheduled on Wednesday, May 01, 2017  Report to [facility] Admitting at 10:45 A.M.  Call this number if you have problems the morning of surgery:  [phone]   Remember:  Do not eat food or drink liquids after midnight Tuesday, April 30, 2017  Take these medicines the morning of surgery with A SIP OF WATER : If needed:acetaminophen (TYLENOL) 500 MG tablet for pain Stop taking Aspirin, vitamins, fish oil, Lysine and herbal medications. Do not take any NSAIDs ie: Ibuprofen, Advil, Naproxen (Anaprox/Aleve), Motrin, diclofenac sodium (VOLTAREN)  GEL; stop Wednesday, April 24, 2017  Do not wear jewelry, make-up or nail polish.  Do not wear lotions, powders, or perfumes, or deoderant.  Do not shave 48 hours prior to surgery.  Men may shave face and neck.  Do not bring valuables to the hospital.  [facility] is not responsible for any belongings or valuables.  Contacts, dentures or bridgework may not be worn into surgery.  Leave your suitcase in the car.  After surgery it may be brought to your room. For patients admitted to the hospital, discharge time will be determined by your treatment team. Special instructions:Shower the night before surgery and the morning of surgery with CHG. Please read over the following fact sheets that you were given. Pain Booklet, Coughing and Deep Breathing, Blood Transfusion Information, Total Joint Packet, MRSA Information and Surgical Site Infection Prevention

## 2017-04-22 NOTE — Progress Notes (Signed)
Pt denies SOB, chest pain, and being under the care of a cardiologist. Pt stated that a stress test and echo were performed at [facility], Cardiology; records requested. Pt denies having an EKG within the last year. Pt denies episodes of lightheadedness and syncope. Pt denies recent labs. Pt PCP is Dr. Denyse Amass at [facility] [employer]. Spoke with Agustin Cree, Surgical Coordinator, to clarify MD's order for chest x ray. Olegario Messier made aware of abnormal chest x ray followed  by a Chest CT.  Olegario Messier advised that we follow anesthesia protocol. Pt chart forwarded to anesthesia to review EKG, chest x ray and Chest CT.

## 2017-04-23 ENCOUNTER — Encounter (HOSPITAL_COMMUNITY): Payer: Self-pay

## 2017-04-23 NOTE — Progress Notes (Addendum)
Anesthesia Chart Review:  Pt is a 66 year old male scheduled for L total knee arthroplasty on 05/01/2017 with Gean Birchwood, MD  - PCP is Clementeen Graham, MD who is aware of upcoming surgery.   PMH includes:  Iron deficiency anemia, asthma (as a child), hyperlipidemia. Never smoker. BMI 27  Medications include: ASA , pravastatin, sildenafil  BP (!) 146/77   Pulse (!) 55   Temp 36.7 C   Resp 20   Ht  (1.803 m)   Wt 191 lb 3.2 oz (86.7 kg)   SpO2 (!) 20%   BMI 26.67 kg/m   Preoperative labs reviewed.  PT normal, PTT 39. Will repeat PTT DOS.   CT chest 10/03/16:  - Mild bilateral lower lobe infiltrative changes. - Small lower lobe nodules bilaterally. Non-contrast chest CT at 3-6 months is recommended.  - I routed CT results to PCP for follow up purposes.   EKG 04/22/17: Sinus bradycardia (47 bpm).   Stress echo 06/28/11 [facility] cardiology): 1. This is an adequate stress test. 2. No evidence inducible ischemia at heart rate achieved. 3. LV normal in size. Normal LV wall thickness. EF normal 55-60%. LV wall motion normal. 4. Mild mitral regurgitation. 5. Mild diastolic dysfunction. 6. Mild aortic root dilatation.  If no changes, I anticipate pt can proceed with surgery as scheduled.   Rica Mast, FNP-BC [facility]/Anesthesiology Phone: [phone] 04/29/2017 4:14 PM

## 2017-04-26 DIAGNOSIS — M1712 Unilateral primary osteoarthritis, left knee: Secondary | ICD-10-CM | POA: Diagnosis present

## 2017-04-26 NOTE — H&P (Signed)
TOTAL KNEE ADMISSION H&P  Patient is being admitted for left total knee arthroplasty.  Subjective:  Chief Complaint:left knee pain.  HPI: Fred Bond, 66 y.o. male, has a history of pain and functional disability in the left knee due to arthritis and has failed non-surgical conservative treatments for greater than 12 weeks to includeNSAID's and/or analgesics, corticosteriod injections, viscosupplementation injections, flexibility and strengthening excercises, weight reduction as appropriate and activity modification.  Onset of symptoms was gradual, starting 2 years ago with gradually worsening course since that time. The patient noted no past surgery on the left knee(s).  Patient currently rates pain in the left knee(s) at 10 out of 10 with activity. Patient has night pain, worsening of pain with activity and weight bearing, pain that interferes with activities of daily living, pain with passive range of motion, crepitus and joint swelling.  Patient has evidence of periarticular osteophytes and joint space narrowing by imaging studies. There is no active infection.  Patient Active Problem List   Diagnosis Date Noted  . Hypertensive retinopathy of both eyes 03/20/2017  . Depression, major, single episode, mild (HCC) 02/12/2017  . Lung nodule < 6cm on CT 10/03/2016  . Retinal tear of both eyes 09/25/2016  . Bone marrow edema 09/05/2016  . Stress at home 03/26/2016  . Senile purpura (HCC) 02/18/2014  . 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 Hyperlipidemia with target low density lipoprotein (LDL) cholesterol less than 130 mg/dL 16/04/9603  . History of iron deficiency 08/07/2012  . Tear of meniscus of left knee 08/07/2012  . Asthma 08/07/2012   Past Medical History:  Diagnosis Date  . Asthma 08/07/2012   Childhood   . Depression   . Headache   . History of iron deficiency 08/07/2012  . Hypercholesterolemia   . Hypertensive retinopathy of both eyes  03/20/2017   Seen on eye exam August 2018  . Mononucleosis    at age 54  . OA (osteoarthritis) of knee    left  . Recurrent pneumonia 08/07/2012  . Tear of meniscus of left knee 08/07/2012    Past Surgical History:  Procedure Laterality Date  . COLONOSCOPY W/ BIOPSIES AND POLYPECTOMY    . EYE SURGERY    . KNEE ARTHROSCOPY     left 2014  . TONSILLECTOMY    . WISDOM TOOTH EXTRACTION      No prescriptions prior to admission.   No Known Allergies  Social History  Substance Use Topics  . Smoking status: Never Smoker  . Smokeless tobacco: Never Used  . Alcohol use No    Family History  Problem Relation Age of Onset  . Alcohol abuse Unknown        grandfather  . Heart attack Unknown        grandparents  . Cancer Mother   . Melanoma Mother   . Melanoma Father      Review of Systems  Constitutional: Negative.   HENT: Negative.   Eyes: Negative.   Respiratory: Negative.   Cardiovascular: Positive for chest pain.  Gastrointestinal: Negative.   Genitourinary: Negative.   Musculoskeletal: Positive for joint pain.  Skin: Negative.   Neurological: Positive for headaches.  Endo/Heme/Allergies: Negative.   Psychiatric/Behavioral: Positive for depression.    Objective:  Physical Exam  Constitutional: He is oriented to person, place, and time. He appears well-developed and well-nourished.  HENT:  Head: Normocephalic and atraumatic.  Eyes: Pupils are equal, round, and reactive to light.  Neck: Normal range of  motion. Neck supple.  Cardiovascular: Intact distal pulses.   Respiratory: Effort normal.  Musculoskeletal: He exhibits tenderness.  Left knee is tender along the medial joint line.  Obvious varus deformity which has progressed over the last year to about 8.  One plus effusion.  Range of motion 5/130 with discomfort.  Neurovascularly intact distally.  Skin is intact.  Neurological: He is alert and oriented to person, place, and time.  Skin: Skin is warm and dry.   Psychiatric: He has a normal mood and affect. His behavior is normal. Judgment and thought content normal.    Vital signs in last 24 hours:    Labs:   Estimated body mass index is 26.67 kg/m as calculated from the following:   Height as of 04/22/17:  (1.803 m).   Weight as of 04/22/17: 86.7 kg (191 lb 3.2 oz).   Imaging Review Plain radiographs demonstrate bilateral AP weightbearing, bilateral Rosenberg, lateral sunrise views of bilateral knees.  Patient's left knee does have end-stage arthritis medial compartment bone-on-bone and patient's right knee does have moderate arthritis of the medial compartment.  Assessment/Plan:  End stage arthritis, left knee   The patient history, physical examination, clinical judgment of the provider and imaging studies are consistent with end stage degenerative joint disease of the left knee(s) and total knee arthroplasty is deemed medically necessary. The treatment options including medical management, injection therapy arthroscopy and arthroplasty were discussed at length. The risks and benefits of total knee arthroplasty were presented and reviewed. The risks due to aseptic loosening, infection, stiffness, patella tracking problems, thromboembolic complications and other imponderables were discussed. The patient acknowledged the explanation, agreed to proceed with the plan and consent was signed. Patient is being admitted for inpatient treatment for surgery, pain control, PT, OT, prophylactic antibiotics, VTE prophylaxis, progressive ambulation and ADL's and discharge planning. The patient is planning to be discharged home with home health services.

## 2017-04-28 ENCOUNTER — Other Ambulatory Visit: Payer: Self-pay | Admitting: Family Medicine

## 2017-04-30 MED ORDER — CEFAZOLIN SODIUM-DEXTROSE 2-4 GM/100ML-% IV SOLN
2.0000 g | INTRAVENOUS | Status: AC
  Administered 2017-05-01: 2 g via INTRAVENOUS
  Filled 2017-04-30: qty 100

## 2017-04-30 MED ORDER — BUPIVACAINE LIPOSOME 1.3 % IJ SUSP
20.0000 mL | Freq: Once | INTRAMUSCULAR | Status: AC
  Administered 2017-05-01: 20 mL
  Filled 2017-04-30: qty 20

## 2017-04-30 MED ORDER — SODIUM CHLORIDE 0.9 % IV SOLN
2000.0000 mg | INTRAVENOUS | Status: AC
  Administered 2017-05-01: 2000 mg via TOPICAL
  Filled 2017-04-30: qty 20

## 2017-04-30 MED ORDER — LACTATED RINGERS IV SOLN
INTRAVENOUS | Status: DC
  Administered 2017-05-01: 12:00:00 via INTRAVENOUS

## 2017-04-30 MED ORDER — TRANEXAMIC ACID 1000 MG/10ML IV SOLN
1000.0000 mg | INTRAVENOUS | Status: AC
  Administered 2017-05-01: 1000 mg via INTRAVENOUS
  Filled 2017-04-30: qty 1100

## 2017-05-01 ENCOUNTER — Encounter (HOSPITAL_COMMUNITY)
Admission: RE | Disposition: A | Discharge status: Home Health | Payer: Self-pay | Source: Ambulatory Visit | Attending: Orthopedic Surgery

## 2017-05-01 ENCOUNTER — Inpatient Hospital Stay (HOSPITAL_COMMUNITY)
Admission: RE | Admit: 2017-05-01 | Discharge: 2017-05-02 | DRG: 470 | Disposition: A | Discharge status: Home Health | Payer: PRIVATE HEALTH INSURANCE | Source: Ambulatory Visit | Attending: Orthopedic Surgery | Admitting: Orthopedic Surgery

## 2017-05-01 ENCOUNTER — Encounter (HOSPITAL_COMMUNITY): Payer: Self-pay

## 2017-05-01 ENCOUNTER — Inpatient Hospital Stay (HOSPITAL_COMMUNITY): Payer: PRIVATE HEALTH INSURANCE | Admitting: Emergency Medicine

## 2017-05-01 DIAGNOSIS — Z79899 Other long term (current) drug therapy: Secondary | ICD-10-CM | POA: Diagnosis not present

## 2017-05-01 DIAGNOSIS — M25562 Pain in left knee: Secondary | ICD-10-CM | POA: Diagnosis present

## 2017-05-01 DIAGNOSIS — D62 Acute posthemorrhagic anemia: Secondary | ICD-10-CM | POA: Diagnosis not present

## 2017-05-01 DIAGNOSIS — I10 Essential (primary) hypertension: Secondary | ICD-10-CM | POA: Diagnosis present

## 2017-05-01 DIAGNOSIS — H35033 Hypertensive retinopathy, bilateral: Secondary | ICD-10-CM | POA: Diagnosis present

## 2017-05-01 DIAGNOSIS — F320 Major depressive disorder, single episode, mild: Secondary | ICD-10-CM | POA: Diagnosis present

## 2017-05-01 DIAGNOSIS — E7800 Pure hypercholesterolemia, unspecified: Secondary | ICD-10-CM | POA: Diagnosis present

## 2017-05-01 DIAGNOSIS — M1712 Unilateral primary osteoarthritis, left knee: Secondary | ICD-10-CM | POA: Diagnosis present

## 2017-05-01 HISTORY — PX: TOTAL KNEE ARTHROPLASTY: SHX125

## 2017-05-01 LAB — APTT: APTT: 38 s — AB (ref 24–36)

## 2017-05-01 SURGERY — ARTHROPLASTY, KNEE, TOTAL
Anesthesia: Spinal | Site: Knee | Laterality: Left

## 2017-05-01 MED ORDER — MIDAZOLAM HCL 2 MG/2ML IJ SOLN
INTRAMUSCULAR | Status: AC
  Filled 2017-05-01: qty 2

## 2017-05-01 MED ORDER — METOCLOPRAMIDE HCL 5 MG PO TABS: 5.0000 mg | ORAL_TABLET | Freq: Three times a day (TID) | ORAL | Status: DC | PRN

## 2017-05-01 MED ORDER — FENTANYL CITRATE (PF) 100 MCG/2ML IJ SOLN
INTRAMUSCULAR | Status: DC | PRN
  Administered 2017-05-01: 50 ug via INTRAVENOUS

## 2017-05-01 MED ORDER — ONDANSETRON HCL 4 MG/2ML IJ SOLN: 4.0000 mg | Freq: Four times a day (QID) | INTRAMUSCULAR | Status: DC | PRN

## 2017-05-01 MED ORDER — BUPIVACAINE IN DEXTROSE 0.75-8.25 % IT SOLN
INTRATHECAL | Status: DC | PRN
  Administered 2017-05-01: 12 mg via INTRATHECAL

## 2017-05-01 MED ORDER — SODIUM CHLORIDE 0.9 % IR SOLN
Status: DC | PRN
  Administered 2017-05-01: 3000 mL

## 2017-05-01 MED ORDER — SENNOSIDES-DOCUSATE SODIUM 8.6-50 MG PO TABS: 1.0000 | ORAL_TABLET | Freq: Every evening | ORAL | Status: DC | PRN

## 2017-05-01 MED ORDER — CEFUROXIME SODIUM 750 MG IJ SOLR
INTRAMUSCULAR | Status: AC
  Filled 2017-05-01: qty 750

## 2017-05-01 MED ORDER — ASPIRIN EC 325 MG PO TBEC: 325.0000 mg | DELAYED_RELEASE_TABLET | Freq: Two times a day (BID) | ORAL | 0 refills | Status: DC

## 2017-05-01 MED ORDER — FLEET ENEMA 7-19 GM/118ML RE ENEM: 1.0000 | ENEMA | Freq: Once | RECTAL | Status: DC | PRN

## 2017-05-01 MED ORDER — ONDANSETRON HCL 4 MG/2ML IJ SOLN
INTRAMUSCULAR | Status: AC
  Filled 2017-05-01: qty 2

## 2017-05-01 MED ORDER — GABAPENTIN 300 MG PO CAPS
300.0000 mg | ORAL_CAPSULE | Freq: Three times a day (TID) | ORAL | Status: DC
  Administered 2017-05-01 – 2017-05-02 (×2): 300 mg via ORAL
  Filled 2017-05-01 (×2): qty 1

## 2017-05-01 MED ORDER — ONDANSETRON HCL 4 MG/2ML IJ SOLN
INTRAMUSCULAR | Status: DC | PRN
  Administered 2017-05-01: 4 mg via INTRAVENOUS

## 2017-05-01 MED ORDER — TRANEXAMIC ACID 1000 MG/10ML IV SOLN
1000.0000 mg | Freq: Once | INTRAVENOUS | Status: DC
  Filled 2017-05-01: qty 10

## 2017-05-01 MED ORDER — HYDROMORPHONE HCL 1 MG/ML IJ SOLN: 0.5000 mg | INTRAMUSCULAR | Status: DC | PRN

## 2017-05-01 MED ORDER — SODIUM CHLORIDE 0.9 % IJ SOLN
INTRAMUSCULAR | Status: DC | PRN
  Administered 2017-05-01: 50 mL

## 2017-05-01 MED ORDER — CEFUROXIME SODIUM 750 MG IJ SOLR
INTRAMUSCULAR | Status: DC | PRN
  Administered 2017-05-01: 1.5 g

## 2017-05-01 MED ORDER — KCL IN DEXTROSE-NACL 20-5-0.45 MEQ/L-%-% IV SOLN
INTRAVENOUS | Status: AC
  Filled 2017-05-01: qty 1000

## 2017-05-01 MED ORDER — MENTHOL 3 MG MT LOZG: 1.0000 | LOZENGE | OROMUCOSAL | Status: DC | PRN

## 2017-05-01 MED ORDER — PHENOL 1.4 % MT LIQD: 1.0000 | OROMUCOSAL | Status: DC | PRN

## 2017-05-01 MED ORDER — ESCITALOPRAM OXALATE 10 MG PO TABS
20.0000 mg | ORAL_TABLET | Freq: Every day | ORAL | Status: DC
  Administered 2017-05-01: 20 mg via ORAL
  Filled 2017-05-01: qty 2

## 2017-05-01 MED ORDER — KCL IN DEXTROSE-NACL 20-5-0.45 MEQ/L-%-% IV SOLN
INTRAVENOUS | Status: DC
  Administered 2017-05-01 – 2017-05-02 (×2): 125 mL/h via INTRAVENOUS
  Filled 2017-05-01: qty 1000

## 2017-05-01 MED ORDER — PROPOFOL 1000 MG/100ML IV EMUL
INTRAVENOUS | Status: AC
  Filled 2017-05-01: qty 100

## 2017-05-01 MED ORDER — OXYCODONE-ACETAMINOPHEN 5-325 MG PO TABS: 1.0000 | ORAL_TABLET | ORAL | 0 refills | Status: DC | PRN

## 2017-05-01 MED ORDER — ALUM & MAG HYDROXIDE-SIMETH 200-200-20 MG/5ML PO SUSP: 30.0000 mL | ORAL | Status: DC | PRN

## 2017-05-01 MED ORDER — DIPHENHYDRAMINE HCL 12.5 MG/5ML PO ELIX: 12.5000 mg | ORAL_SOLUTION | ORAL | Status: DC | PRN

## 2017-05-01 MED ORDER — ASPIRIN EC 325 MG PO TBEC
325.0000 mg | DELAYED_RELEASE_TABLET | Freq: Every day | ORAL | Status: DC
  Administered 2017-05-02: 325 mg via ORAL
  Filled 2017-05-01: qty 1

## 2017-05-01 MED ORDER — DOCUSATE SODIUM 100 MG PO CAPS
100.0000 mg | ORAL_CAPSULE | Freq: Two times a day (BID) | ORAL | Status: DC
  Administered 2017-05-01 – 2017-05-02 (×2): 100 mg via ORAL
  Filled 2017-05-01 (×2): qty 1

## 2017-05-01 MED ORDER — CHLORHEXIDINE GLUCONATE 4 % EX LIQD: 60.0000 mL | Freq: Once | CUTANEOUS | Status: DC

## 2017-05-01 MED ORDER — PROPOFOL 500 MG/50ML IV EMUL
INTRAVENOUS | Status: DC | PRN
  Administered 2017-05-01: 25 ug/kg/min via INTRAVENOUS

## 2017-05-01 MED ORDER — PROPOFOL 10 MG/ML IV BOLUS
INTRAVENOUS | Status: DC | PRN
  Administered 2017-05-01: 20 mg via INTRAVENOUS

## 2017-05-01 MED ORDER — PROPOFOL 10 MG/ML IV BOLUS
INTRAVENOUS | Status: AC
  Filled 2017-05-01: qty 20

## 2017-05-01 MED ORDER — ONDANSETRON HCL 4 MG PO TABS: 4.0000 mg | ORAL_TABLET | Freq: Four times a day (QID) | ORAL | Status: DC | PRN

## 2017-05-01 MED ORDER — DEXAMETHASONE SODIUM PHOSPHATE 10 MG/ML IJ SOLN
10.0000 mg | Freq: Once | INTRAMUSCULAR | Status: AC
  Administered 2017-05-02: 10 mg via INTRAVENOUS
  Filled 2017-05-01: qty 1

## 2017-05-01 MED ORDER — LACTATED RINGERS IV SOLN
INTRAVENOUS | Status: DC | PRN
  Administered 2017-05-01: 13:00:00 via INTRAVENOUS

## 2017-05-01 MED ORDER — TIZANIDINE HCL 2 MG PO TABS: 2.0000 mg | ORAL_TABLET | Freq: Four times a day (QID) | ORAL | 0 refills | Status: DC | PRN

## 2017-05-01 MED ORDER — MIDAZOLAM HCL 2 MG/2ML IJ SOLN
2.0000 mg | Freq: Once | INTRAMUSCULAR | Status: AC
  Administered 2017-05-01: 2 mg via INTRAVENOUS
  Filled 2017-05-01: qty 2

## 2017-05-01 MED ORDER — PHENYLEPHRINE HCL 10 MG/ML IJ SOLN
INTRAVENOUS | Status: DC | PRN
  Administered 2017-05-01: 25 ug/min via INTRAVENOUS

## 2017-05-01 MED ORDER — FENTANYL CITRATE (PF) 250 MCG/5ML IJ SOLN
INTRAMUSCULAR | Status: AC
  Filled 2017-05-01: qty 5

## 2017-05-01 MED ORDER — METHOCARBAMOL 500 MG PO TABS
500.0000 mg | ORAL_TABLET | Freq: Four times a day (QID) | ORAL | Status: DC | PRN
  Administered 2017-05-01 – 2017-05-02 (×2): 500 mg via ORAL
  Filled 2017-05-01 (×2): qty 1

## 2017-05-01 MED ORDER — ACETAMINOPHEN 650 MG RE SUPP: 650.0000 mg | Freq: Four times a day (QID) | RECTAL | Status: DC | PRN

## 2017-05-01 MED ORDER — ROPIVACAINE HCL 7.5 MG/ML IJ SOLN
INTRAMUSCULAR | Status: DC | PRN
  Administered 2017-05-01: 25 mL via PERINEURAL

## 2017-05-01 MED ORDER — CELECOXIB 200 MG PO CAPS
200.0000 mg | ORAL_CAPSULE | Freq: Two times a day (BID) | ORAL | Status: DC
  Administered 2017-05-01 – 2017-05-02 (×2): 200 mg via ORAL
  Filled 2017-05-01 (×2): qty 1

## 2017-05-01 MED ORDER — OXYCODONE HCL 5 MG PO TABS
5.0000 mg | ORAL_TABLET | ORAL | Status: DC | PRN
  Administered 2017-05-02: 5 mg via ORAL
  Filled 2017-05-01: qty 1

## 2017-05-01 MED ORDER — BUPIVACAINE-EPINEPHRINE (PF) 0.25% -1:200000 IJ SOLN
INTRAMUSCULAR | Status: DC | PRN
  Administered 2017-05-01: 50 mL

## 2017-05-01 MED ORDER — ACETAMINOPHEN 325 MG PO TABS
650.0000 mg | ORAL_TABLET | Freq: Four times a day (QID) | ORAL | Status: DC | PRN
  Administered 2017-05-01 – 2017-05-02 (×2): 650 mg via ORAL
  Filled 2017-05-01 (×2): qty 2

## 2017-05-01 MED ORDER — MIDAZOLAM HCL 5 MG/5ML IJ SOLN
INTRAMUSCULAR | Status: DC | PRN
  Administered 2017-05-01: 2 mg via INTRAVENOUS

## 2017-05-01 MED ORDER — FENTANYL CITRATE (PF) 100 MCG/2ML IJ SOLN
100.0000 ug | Freq: Once | INTRAMUSCULAR | Status: AC
  Administered 2017-05-01: 100 ug via INTRAVENOUS
  Filled 2017-05-01: qty 2

## 2017-05-01 MED ORDER — PRAVASTATIN SODIUM 40 MG PO TABS
40.0000 mg | ORAL_TABLET | Freq: Every day | ORAL | Status: DC
  Administered 2017-05-02: 40 mg via ORAL
  Filled 2017-05-01: qty 1

## 2017-05-01 MED ORDER — PHENYLEPHRINE HCL 10 MG/ML IJ SOLN
INTRAMUSCULAR | Status: DC | PRN
  Administered 2017-05-01 (×4): 80 ug via INTRAVENOUS

## 2017-05-01 MED ORDER — METHOCARBAMOL 1000 MG/10ML IJ SOLN
500.0000 mg | Freq: Four times a day (QID) | INTRAMUSCULAR | Status: DC | PRN
  Filled 2017-05-01: qty 5

## 2017-05-01 MED ORDER — METOCLOPRAMIDE HCL 5 MG/ML IJ SOLN: 5.0000 mg | Freq: Three times a day (TID) | INTRAMUSCULAR | Status: DC | PRN

## 2017-05-01 MED ORDER — BUPIVACAINE-EPINEPHRINE 0.25% -1:200000 IJ SOLN
INTRAMUSCULAR | Status: AC
  Filled 2017-05-01: qty 1

## 2017-05-01 MED ORDER — PHENYLEPHRINE 40 MCG/ML (10ML) SYRINGE FOR IV PUSH (FOR BLOOD PRESSURE SUPPORT)
PREFILLED_SYRINGE | INTRAVENOUS | Status: AC
  Filled 2017-05-01: qty 10

## 2017-05-01 MED ORDER — BISACODYL 5 MG PO TBEC: 5.0000 mg | DELAYED_RELEASE_TABLET | Freq: Every day | ORAL | Status: DC | PRN

## 2017-05-01 SURGICAL SUPPLY — 52 items
BANDAGE ACE 6X5 VEL STRL LF (GAUZE/BANDAGES/DRESSINGS) ×3 IMPLANT
BANDAGE ESMARK 6X9 LF (GAUZE/BANDAGES/DRESSINGS) ×1 IMPLANT
BLADE SAG 18X100X1.27 (BLADE) ×3 IMPLANT
BLADE SAGITTAL 13X1.27X60 (BLADE) ×2 IMPLANT
BLADE SAGITTAL 13X1.27X60MM (BLADE) ×1
BLADE SAW SGTL 13X75X1.27 (BLADE) ×3 IMPLANT
BNDG ELASTIC 6X10 VLCR STRL LF (GAUZE/BANDAGES/DRESSINGS) ×3 IMPLANT
BNDG ESMARK 6X9 LF (GAUZE/BANDAGES/DRESSINGS) ×3
BOWL SMART MIX CTS (DISPOSABLE) ×3 IMPLANT
CAPT KNEE TOTAL 3 ATTUNE ×3 IMPLANT
CEMENT HV SMART SET (Cement) ×6 IMPLANT
COVER SURGICAL LIGHT HANDLE (MISCELLANEOUS) ×3 IMPLANT
CUFF TOURNIQUET SINGLE 34IN LL (TOURNIQUET CUFF) ×3 IMPLANT
CUFF TOURNIQUET SINGLE 44IN (TOURNIQUET CUFF) IMPLANT
DRAPE EXTREMITY T 121X128X90 (DRAPE) ×3 IMPLANT
DRAPE U-SHAPE 47X51 STRL (DRAPES) ×3 IMPLANT
DRSG AQUACEL AG ADV 3.5X10 (GAUZE/BANDAGES/DRESSINGS) ×3 IMPLANT
DURAPREP 26ML APPLICATOR (WOUND CARE) ×3 IMPLANT
ELECT REM PT RETURN 9FT ADLT (ELECTROSURGICAL) ×3
ELECTRODE REM PT RTRN 9FT ADLT (ELECTROSURGICAL) ×1 IMPLANT
GLOVE BIO SURGEON STRL SZ7.5 (GLOVE) ×3 IMPLANT
GLOVE BIO SURGEON STRL SZ8.5 (GLOVE) ×3 IMPLANT
GLOVE BIOGEL PI IND STRL 8 (GLOVE) ×1 IMPLANT
GLOVE BIOGEL PI IND STRL 9 (GLOVE) ×1 IMPLANT
GLOVE BIOGEL PI INDICATOR 8 (GLOVE) ×2
GLOVE BIOGEL PI INDICATOR 9 (GLOVE) ×2
GOWN STRL REUS W/ TWL LRG LVL3 (GOWN DISPOSABLE) ×1 IMPLANT
GOWN STRL REUS W/ TWL XL LVL3 (GOWN DISPOSABLE) ×2 IMPLANT
GOWN STRL REUS W/TWL LRG LVL3 (GOWN DISPOSABLE) ×2
GOWN STRL REUS W/TWL XL LVL3 (GOWN DISPOSABLE) ×4
HANDPIECE INTERPULSE COAX TIP (DISPOSABLE) ×2
HOOD PEEL AWAY FACE SHEILD DIS (HOOD) ×6 IMPLANT
KIT BASIN OR (CUSTOM PROCEDURE TRAY) ×3 IMPLANT
KIT ROOM TURNOVER OR (KITS) ×3 IMPLANT
MANIFOLD NEPTUNE II (INSTRUMENTS) ×3 IMPLANT
NEEDLE 22X1 1/2 (OR ONLY) (NEEDLE) ×6 IMPLANT
NS IRRIG 1000ML POUR BTL (IV SOLUTION) ×3 IMPLANT
PACK TOTAL JOINT (CUSTOM PROCEDURE TRAY) ×3 IMPLANT
PAD ARMBOARD 7.5X6 YLW CONV (MISCELLANEOUS) ×6 IMPLANT
SET HNDPC FAN SPRY TIP SCT (DISPOSABLE) ×1 IMPLANT
SUT VIC AB 0 CT1 27 (SUTURE) ×2
SUT VIC AB 0 CT1 27XBRD ANBCTR (SUTURE) ×1 IMPLANT
SUT VIC AB 1 CTX 36 (SUTURE) ×2
SUT VIC AB 1 CTX36XBRD ANBCTR (SUTURE) ×1 IMPLANT
SUT VIC AB 2-0 CT1 27 (SUTURE) ×2
SUT VIC AB 2-0 CT1 TAPERPNT 27 (SUTURE) ×1 IMPLANT
SUT VIC AB 3-0 CT1 27 (SUTURE) ×2
SUT VIC AB 3-0 CT1 TAPERPNT 27 (SUTURE) ×1 IMPLANT
SYR CONTROL 10ML LL (SYRINGE) ×6 IMPLANT
TOWEL OR 17X24 6PK STRL BLUE (TOWEL DISPOSABLE) ×3 IMPLANT
TOWEL OR 17X26 10 PK STRL BLUE (TOWEL DISPOSABLE) ×3 IMPLANT
TRAY CATH 16FR W/PLASTIC CATH (SET/KITS/TRAYS/PACK) IMPLANT

## 2017-05-01 NOTE — Op Note (Signed)
PATIENT ID:      Fred Bond  MRN:     [id number] DOB/AGE:    64-Oct-1952 / 64 y.o.       OPERATIVE REPORT    DATE OF PROCEDURE:  05/01/2017       PREOPERATIVE DIAGNOSIS:   LEFT KNEE OSTEOARTHRITIS      Estimated body mass index is 26.67 kg/m as calculated from the following:   Height as of this encounter:  (1.803 m).   Weight as of this encounter: 86.7 kg (191 lb 3.2 oz).                                                        POSTOPERATIVE DIAGNOSIS:   LEFT KNEE OSTEOARTHRITIS                                                                      PROCEDURE:  Procedure(s): TOTAL KNEE ARTHROPLASTY Using DepuyAttune RP implants #8L Femur, #9Tibia, 7 mm Attune RP bearing, 41 Patella     SURGEON: Ambrose Wile J    ASSISTANT:   Eric K. [employer]   (Present and scrubbed throughout the case, critical for assistance with exposure, retraction, instrumentation, and closure.)         ANESTHESIA: spinal, 20cc Exparel, 50cc 0.25% Marcaine  EBL: 400  FLUID REPLACEMENT: 1500 crystalloid  TOURNIQUET TIME:  Drains: None  Tranexamic Acid: 1gm IV, 2gm topical  COMPLICATIONS:  None         INDICATIONS FOR PROCEDURE: The patient has  LEFT KNEE OSTEOARTHRITIS, Var deformities, XR shows bone on bone arthritis, lateral subluxation of tibia. Patient has failed all conservative measures including anti-inflammatory medicines, narcotics, attempts at  exercise and weight loss, cortisone injections and viscosupplementation.  Risks and benefits of surgery have been discussed, questions answered.   DESCRIPTION OF PROCEDURE: The patient identified by armband, received  IV antibiotics, in the holding area at [facility]. Patient taken to the operating room, appropriate anesthetic  monitors were attached, and Spinal anesthesia was  induced. Tourniquet  applied high to the operative thigh. Lateral post and foot positioner  applied to the table, the lower extremity was then prepped and draped   in usual sterile fashion from the toes to the tourniquet. Time-out procedure was performed. We began the operation, with the knee flexed 120 degrees, by making the anterior midline incision starting at handbreadth above the patella going over the patella 1 cm medial to and 4 cm distal to the tibial tubercle. Small bleeders in the skin and the  subcutaneous tissue identified and cauterized. Transverse retinaculum was incised and reflected medially and a medial parapatellar arthrotomy was accomplished. the patella was everted and theprepatellar fat pad resected. The superficial medial collateral  ligament was then elevated from anterior to posterior along the proximal  flare of the tibia and anterior half of the menisci resected. The knee was hyperflexed exposing bone on bone arthritis. Peripheral and notch osteophytes as well as the cruciate ligaments were then resected. We continued to  work our way around posteriorly along the proximal  tibia, and externally  rotated the tibia subluxing it out from underneath the femur. A McHale  retractor was placed through the notch and a lateral Hohmann retractor  placed, and we then drilled through the proximal tibia in line with the  axis of the tibia followed by an intramedullary guide rod and 2-degree  posterior slope cutting guide. The tibial cutting guide, 3 degree posterior sloped, was pinned into place allowing resection of 1 mm of bone medially and 11 mm of bone laterally. Satisfied with the tibial resection, we then  entered the distal femur 2 mm anterior to the PCL origin with the  intramedullary guide rod and applied the distal femoral cutting guide  set at 9 mm, with 5 degrees of valgus. This was pinned along the  epicondylar axis. At this point, the distal femoral cut was accomplished without difficulty. We then sized for a #8L femoral component and pinned the guide in 3 degrees of external rotation. The chamfer cutting guide was pinned into place.  The anterior, posterior, and chamfer cuts were accomplished without difficulty followed by  the Attune RP box cutting guide and the box cut. We also removed posterior osteophytes from the posterior femoral condyles. At this  time, the knee was brought into full extension. We checked our  extension and flexion gaps and found them symmetric for a 7 mm bearing. Distracting in extension with a lamina spreader, the posterior horns of the menisci were removed, and Exparel, diluted to 60 cc, with 20cc NS, and 20cc 0.5% Marcaine,was injected into the capsule and synovium of the knee. The posterior patella cut was accomplished with the 9.5 mm Attune cutting guide, sized for a 41mm dome, and the fixation pegs drilled.The knee  was then once again hyperflexed exposing the proximal tibia. We sized for a # 9 tibial base plate, applied the smokestack and the conical reamer followed by the the Delta fin keel punch. We then hammered into place the Attune RP trial femoral component, drilled the lugs, inserted a  7 mm trial bearing, trial patellar button, and took the knee through range of motion from 0-130 degrees. No thumb pressure was required for patellar Tracking. At this point, the limb was wrapped with an Esmarch bandage and the tourniquet inflated to 350 mmHg. All trial components were removed, mating surfaces irrigated with pulse lavage, and dried with suction and sponges. 10 cc of the Exparel solution was applied to the cancellus bone of the patella distal femur and proximal tibia.  After waiting 1 minute, the bony surfaces were again, dried with sponges. A double batch of DePuy HV cement with 1500 mg of Zinacef was mixed and applied to all bony metallic mating surfaces except for the posterior condyles of the femur itself. In order, we hammered into place the tibial tray and removed excess cement, the femoral component and removed excess cement. The final Attune RP bearing  was inserted, and the knee brought to full  extension with compression.  The patellar button was clamped into place, and excess cement  removed. While the cement cured the wound was irrigated out with normal saline solution pulse lavage. Ligament stability and patellar tracking were checked and found to be excellent. The parapatellar arthrotomy was closed with  running #1 Vicryl suture. The subcutaneous tissue with 0 and 2-0 undyed  Vicryl suture, and the skin with running 3-0 SQ vicryl. A dressing of Xeroform,  4 x 4, dressing sponges, Webril, and Ace wrap applied. The patient  awakened, and  taken to recovery room without difficulty.   Ulus Hazen J 05/01/2017, 2:37 PM

## 2017-05-01 NOTE — Progress Notes (Signed)
Orthopedic Tech Progress Note Patient Details:  Fred Bond 10/20/1950 [id number]  Ortho Devices Ortho Device/Splint Location: footsie roll Ortho Device/Splint Interventions: Casandra Doffing 05/01/2017, 3:49 PM

## 2017-05-01 NOTE — Interval H&P Note (Signed)
History and Physical Interval Note:  05/01/2017 12:58 PM  Fred Bond  has presented today for surgery, with the diagnosis of LEFT KNEE OSTEOARTHRITIS  The various methods of treatment have been discussed with the patient and family. After consideration of risks, benefits and other options for treatment, the patient has consented to  Procedure(s): TOTAL KNEE ARTHROPLASTY (Left) as a surgical intervention .  The patient's history has been reviewed, patient examined, no change in status, stable for surgery.  I have reviewed the patient's chart and labs.  Questions were answered to the patient's satisfaction.     Nestor Lewandowsky

## 2017-05-01 NOTE — Anesthesia Postprocedure Evaluation (Signed)
Anesthesia Post Note  Patient: Romney Compean  Procedure(s) Performed: TOTAL KNEE ARTHROPLASTY (Left Knee)     Patient location during evaluation: PACU Anesthesia Type: Spinal Level of consciousness: oriented and awake and alert Pain management: pain level controlled Vital Signs Assessment: post-procedure vital signs reviewed and stable Respiratory status: spontaneous breathing, respiratory function stable and patient connected to nasal cannula oxygen Cardiovascular status: blood pressure returned to baseline and stable Postop Assessment: no headache, no backache and no apparent nausea or vomiting Anesthetic complications: no    Last Vitals:  Vitals:   05/01/17 1610 05/01/17 1625  BP: 127/76 120/76  Pulse: (!) 49 (!) 49  Resp: 15 12  Temp:    SpO2: 99% 99%    Last Pain:  Vitals:   05/01/17 1515  TempSrc:   PainSc: 0-No pain                 Makaila Windle

## 2017-05-01 NOTE — Transfer of Care (Signed)
Immediate Anesthesia Transfer of Care Note  Patient: Laken Lobato  Procedure(s) Performed: TOTAL KNEE ARTHROPLASTY (Left Knee)  Patient Location: PACU  Anesthesia Type:Spinal  Level of Consciousness: awake  Airway & Oxygen Therapy: Patient Spontanous Breathing and Patient connected to nasal cannula oxygen  Post-op Assessment: Report given to RN and Post -op Vital signs reviewed and stable  Post vital signs: Reviewed and stable  Last Vitals:  Vitals:   05/01/17 1049  BP: 137/74  Pulse: (!) 57  Resp: 18  Temp: 37.1 C  SpO2: 98%    Last Pain:  Vitals:   05/01/17 1110  TempSrc:   PainSc: 2       Patients Stated Pain Goal: 0 (05/01/17 1110)  Complications: No apparent anesthesia complications

## 2017-05-01 NOTE — Anesthesia Procedure Notes (Signed)
Spinal  Patient location during procedure: OR Start time: 05/01/2017 1:10 PM End time: 05/01/2017 1:20 PM Staffing Anesthesiologist: Tsion Inghram Preanesthetic Checklist Completed: patient identified, site marked, surgical consent, pre-op evaluation, timeout performed, IV checked, risks and benefits discussed and monitors and equipment checked Spinal Block Patient position: sitting Prep: DuraPrep Patient monitoring: heart rate, cardiac monitor, continuous pulse ox and blood pressure Approach: midline Location: L4-5 Injection technique: single-shot Needle Needle type: Sprotte  Needle gauge: 24 G Needle length: 9 cm Assessment Sensory level: T4

## 2017-05-01 NOTE — Anesthesia Preprocedure Evaluation (Signed)
Anesthesia Evaluation  Patient identified by MRN, date of birth, ID band Patient awake    Reviewed: Allergy & Precautions, NPO status , Patient's Chart, lab work & pertinent test results  Airway Mallampati: II  TM Distance: >3 FB Neck ROM: Full    Dental no notable dental hx.    Pulmonary neg pulmonary ROS, asthma ,    Pulmonary exam normal breath sounds clear to auscultation       Cardiovascular negative cardio ROS Normal cardiovascular exam Rhythm:Regular Rate:Normal     Neuro/Psych PSYCHIATRIC DISORDERS Depression negative neurological ROS  negative psych ROS   GI/Hepatic negative GI ROS, Neg liver ROS,   Endo/Other  negative endocrine ROS  Renal/GU negative Renal ROS  negative genitourinary   Musculoskeletal negative musculoskeletal ROS (+) Arthritis , Osteoarthritis,    Abdominal   Peds negative pediatric ROS (+)  Hematology negative hematology ROS (+)   Anesthesia Other Findings EKG 04/22/17: Sinus bradycardia (47 bpm).   Stress echo 06/28/11 [facility] cardiology): 1. This is an adequate stress test. 2. No evidence inducible ischemia at heart rate achieved. 3. LV normal in size. Normal LV wall thickness. EF normal 55-60%. LV wall motion normal. 4. Mild mitral regurgitation. 5. Mild diastolic dysfunction. 6. Mild aortic root dilatation.  Reproductive/Obstetrics negative OB ROS                             Anesthesia Physical Anesthesia Plan  ASA: III  Anesthesia Plan: Spinal   Post-op Pain Management:  Regional for Post-op pain   Induction:   PONV Risk Score and Plan: 2 and Ondansetron, Dexamethasone, Treatment may vary due to age or medical condition and Midazolam  Airway Management Planned: Nasal Cannula, Natural Airway and Simple Face Mask  Additional Equipment:   Intra-op Plan:   Post-operative Plan:   Informed Consent:   Plan Discussed with:    Anesthesia Plan Comments: (  )        Anesthesia Quick Evaluation

## 2017-05-01 NOTE — Discharge Instructions (Signed)

## 2017-05-01 NOTE — Anesthesia Procedure Notes (Addendum)
Anesthesia Regional Block: Adductor canal block   Pre-Anesthetic Checklist: ,, timeout performed, Correct Patient, Correct Site, Correct Laterality, Correct Procedure, Correct Position, site marked, Risks and benefits discussed,  Surgical consent,  Pre-op evaluation,  At surgeon's request and post-op pain management  Laterality: Left  Prep: chloraprep       Needles:  Injection technique: Single-shot  Needle Type: Echogenic Stimulator Needle     Needle Length: 5cm  Needle Gauge: 22     Additional Needles:   Procedures:, nerve stimulator,,, ultrasound used (permanent image in chart),,,,  Narrative:  Start time: 05/01/2017 12:40 PM End time: 05/01/2017 12:50 PM Injection made incrementally with aspirations every 5 mL.  Performed by: Personally  Anesthesiologist: Lynlee Stratton  Additional Notes: Functioning IV was confirmed and monitors were applied.  A 1Ness Rogelia BoAtlanta Surgery CMarchelleHarr78.Ge982283Levert FKentuckyeins228Marland KitcheMulticare Valley Hospital And Med61Jim Taliafer[facility] Community MentaRogelia BoRaritan Bay Medical Center - OMarchelleHarr78.Ge([phone] FKentuckyeins226Marland KitchMidmichigan Medical Cen5St. Mary'S HospiRogelia BoStark Ambulatory Surgery CMarchelleHarr78.Ge(50724)3Levert FKentuckyeins292Marland KitchenSicklRavine Way Surgery41Nevada RegionalRogelia BoMooresville Endoscopy CMarchelleHarr78.Ge929516Levert FKentuckyeins234Marland KitchenBoulevarBanner Hea12WhRogelia BoRegenerative Orthopaedics Surgery CMarchelleHarr78.Ge518186Levert FKentuckyeins212Marland KitchPhysicians Surg10Uhhs Memorial HosRogelia BoLakeview Behavioral HealMarchelleHarr78.Ge69621Levert FKentuckyeins213Marland KitcGaylo71Waterside Ambulatory SurgRogelia BoDesoto MemorialMarchelleHarr78.Ge(62400)9Levert FKentuckyeins278Marland KitchGrant Med36[facility] Corporation Illini ComRogelia BoWilliam S Hall Psychiatric MarchelleHarr78.Ge359344Levert FKentuckyeins253Marland KitcheAtrium Heal13[facility] & UniRogelia BoBaystate Franklin MedicMarchelleHarr78.Ge7257Levert FKentuckyeins227Marland KitchenWhiteHosp General Menonita39Saint Luke'S Northland HospitRogelia BoHospital San AnMarchelleHarr78.Ge(550303Levert FKentuckyeins256Marland KitcheJackson Purchase Med88Metro Health Asc LLC Dba Metro Health OamRogelia BoLawrence Surgery CMarchelleHarr78.Ge813308Levert FKentuckyeins283Marland KitchenSumma Health System Barbert78[facility] SRogelia BoSt Marys HsptMarchelleHarr78.Ge([phone])5Levert FKentuckyeins247Marland KitcheUf Health J92Biltmore SurgicRogelia BoMemorial Hermann Surgery Center Texas MedicMarchelleHarr78.Ge255Levert FKentuckyeins267Marland KitcUva Healthsouth Rehabilitati77G[facility] Hospital AndRogelia BoMarshall Medical CenMarchelleHarr78.Ge5228Levert FKentuckyeins255Marland KitcheSummit Med28Woolfson Ambulatory SurRogelia BoAdventist Health White Memorial MedicMarchelleHarr78.Ge920512Levert FKentuckyeins253Marland KitchenAbitAvera Holy Fami48Northern Nj EndosRogelia BoCapitola SurgeMarchelleHarr78.Ge66808Levert FKentuckyeins263Marland KitchePhoenix Va Med35[facility] Medical Rogelia BoHaven Behavioral Hospital MarchelleHarr78.Ge876643Levert FKentuckyeins276Marland KitcheSurgery Center Of Port Ch24Stewart Memorial ComRogelia BoCheyenne Va MedicMarchelleHarr78.G80e25Levert FKentuckyeins285Marland KitDelano Regional Med45LubbockRogelia BoMemorial MedicMarchelleHarr78.Ge633104Levert FKentuckyeins255Marland KitchBoston Medical Center - East Ne37NortRogelia BoBaptist Health Extended Care Hospital-Little RMarchelleHarr78.Ge17137Levert FKentuckyeins222Marland KitchenBThe Eve49Eye Surgery Center Of SainRogelia BoBanner Del E. Webb MedicMarchelleHarr78.Ge26316Levert FKentuckyeins238Marland KitchenRBaylor Scott And White Surgicare74The Surgery Center At Self MemoriRogelia BoSouth Shore HosMarchelleHarr78.Ge475404Levert FKentuckyeins289Marland KitcheHighland Distri23Rehabilitation Hospital Rogelia BoEmory Univ Hospital- Emory UMarchelleHarr78.Ge(51219)5Levert FKentuckyeins252Marland KitchenLSpecialty Hospi84North Iowa Medical CenRogelia BoStrong MemorialMarchelleHarr78.Ge17017Levert FKentuckyeins223Marland KitchenPrPhysicians Surgical Hospital - 7[facility] [place] Spine And SuRogelia BoDurham Va MedicMarchelleHarr78.G27e2Levert FKentuckyeins284Marland KitcheIowa City Va Med75Baylor Scott & White ContinuinRogelia BoHeart Of Florida Regional MedicMarchelleHarr78.Ge(478303Levert FKentuckyeins29Marland KitchenMemorial Hermann7Indian[place] VaRogelia BoBeaumont HospitalMarchelleHarr78.Ge519207Levert FKentuckyeins263Marland KitchBeacon Orthopaedics Sur82[facility] For Rogelia BoCalifornia Hospital Medical Center - LoMarchelleHarr78.Ge29402Levert FKentuckyeins237Marland KitchCovenant Medical Center76Laurel Surgery And EndosRogelia BoDublin Eye Surgery CMarchelleHarr78.Ge(2635Levert FKentuckyeins261Marland KitchMassachusetts Ave Sur72Texas Childrens HospitaRogelia BoTurquoise LodgeMarchelleHarr78.Ge(521078Levert FKentuckyeins235Marland KitcheCleveland Clinic Rehabilitation Hospital,76Surgical ElRogelia BoFranklin MedicMarchelleHarr78.Ge855148Levert FKentuckyeins240Marland KitchenStHealthmark Regional Medical Centertdalechogenic stimulator needle was used. Sterile prep and drape,hand hygiene and sterile gloves were used. Ultrasound guidance: relevant anatomy identified, needle position confirmed, local anesthetic spread visualized around nerve(s)., vascular puncture avoided.  Image printed for medical record. Negative aspiration and negative test dose prior to incremental administration of local anesthetic. The patient tolerated the procedure well.

## 2017-05-02 ENCOUNTER — Encounter (HOSPITAL_COMMUNITY): Payer: Self-pay | Admitting: General Practice

## 2017-05-02 LAB — BASIC METABOLIC PANEL
Anion gap: 7 (ref 5–15)
BUN: 12 mg/dL (ref 6–20)
CO2: 25 mmol/L (ref 22–32)
CREATININE: 0.8 mg/dL (ref 0.61–1.24)
Calcium: 8.6 mg/dL — ABNORMAL LOW (ref 8.9–10.3)
Chloride: 102 mmol/L (ref 101–111)
GFR calc Af Amer: >60 mL/min (ref >60)
GLUCOSE: 158 mg/dL — AB (ref 65–99)
Potassium: 4.3 mmol/L (ref 3.5–5.1)
SODIUM: 134 mmol/L — AB (ref 135–145)

## 2017-05-02 LAB — CBC
HCT: 34.6 % — ABNORMAL LOW (ref 39.0–52.0)
Hemoglobin: 11.8 g/dL — ABNORMAL LOW (ref 13.0–17.0)
MCH: 32.5 pg (ref 26.0–34.0)
MCHC: 34.1 g/dL (ref 30.0–36.0)
MCV: 95.3 fL (ref 78.0–100.0)
PLATELETS: 173 10*3/uL (ref 150–400)
RBC: 3.63 MIL/uL — ABNORMAL LOW (ref 4.22–5.81)
RDW: 13.5 % (ref 11.5–15.5)
WBC: 11.9 10*3/uL — ABNORMAL HIGH (ref 4.0–10.5)

## 2017-05-02 NOTE — Progress Notes (Signed)
PATIENT ID: Fred Bond  MRN: [id number]  DOB/AGE:  1950-08-28 / 66 y.o.  1 Day Post-Op Procedure(s) (LRB): TOTAL KNEE ARTHROPLASTY (Left)    PROGRESS NOTE Subjective: Patient is alert, oriented, No Nausea, No Vomiting, yes passing gas. Taking PO Well. Denies SOB, Chest or Calf Pain. Using Incentive Spirometer, PAS in place. Ambulate Bathroom, Patient reports pain as 1/10 .    Objective: Vital signs in last 24 hours: Vitals:   05/01/17 2037 05/02/17 0110 05/02/17 0300 05/02/17 0905  BP: 120/79 106/66 110/72 115/63  Pulse: 60 (!) 55 (!) 51 (!) 57  Resp: Temp: 97.9 F (36.6 C)  (!) 97.5 F (36.4 C) 97.9 F (36.6 C)  TempSrc: Oral  Axillary Oral  SpO2: 98% 93% 95% 95%  Weight:      Height:          Intake/Output from previous day: I/O last 3 completed shifts: In: 1458.3 [I.V.:1458.3] Out: 1900 [Urine:1800; Blood:100]   Intake/Output this shift: Total I/O In: -  Out: 700 [Urine:700]   LABORATORY DATA:  Recent Labs  05/02/17 0512  WBC 11.9*  HGB 11.8*  HCT 34.6*  PLT 173  NA 134*  K 4.3  CL 102  CO2 25  BUN 12  CREATININE 0.80  GLUCOSE 158*  CALCIUM 8.6*    Examination: Neurologically intact ABD soft Neurovascular intact Sensation intact distally Intact pulses distally Dorsiflexion/Plantar flexion intact Incision: dressing C/D/I No cellulitis present Compartment soft}  Assessment:   1 Day Post-Op Procedure(s) (LRB): TOTAL KNEE ARTHROPLASTY (Left) ADDITIONAL DIAGNOSIS: Expected Acute Blood Loss Anemia, Hypertension  Plan: PT/OT WBAT, AROM and PROM  DVT Prophylaxis:  SCDx72hrs, ASA 325 mg BID x 2 weeks DISCHARGE PLAN: Home, today.  If he passes physical therapy DISCHARGE NEEDS: HHPT, Walker and 3-in-1 comode seat     Fred Bond J 05/02/2017, 9:39 AM Patient ID: Fred Bond, male   DOB: 1950/09/20, 66 y.o.   MRN: [id number]

## 2017-05-02 NOTE — Care Management Note (Signed)
Case Management Note  Patient Details  Name: Fred Bond MRN: [id number] Date of Birth: 11/06/1950  Subjective/Objective:  66 yr old gentleman s/p left total knee arthroplasty.                  Action/Plan: Case manager spoke with patient and his wife concerning discharge and DME. Choice for Home Health Agency was offered, referral was called to Shaune Leeks, [facility] Liaison. Patient has rolling walker and 3in1, will have family support at discharge.    Expected Discharge Date:  05/02/17               Expected Discharge Plan:  Home w Home Health Services  In-House Referral:  NA  Discharge planning Services  CM Consult  Post Acute Care Choice:  Home Health Choice offered to:  Patient, Spouse  DME Arranged:  N/A (has RW and 3in1) DME Agency:  NA  HH Arranged:  PT HH Agency:  [facility]  Status of Service:  Completed, signed off  If discussed at Long Length of Stay Meetings, dates discussed:    Additional Comments:  Durenda Guthrie, RN 05/02/2017, 2:22 PM

## 2017-05-02 NOTE — Evaluation (Signed)
Occupational Therapy Evaluation Patient Details Name: Fred Bond MRN: [id number] DOB: [birth year] Today's Date: 05/02/2017    History of Present Illness Pt is a 67 y.o. male now s/p elective L TKA on 05/01/17. Pertinent PMH includes bilateral hypertensive retinopathy, OA, asthma.   Clinical Impression   Pt admitted with the above diagnoses and presents with below problem list. PTA pt was independent with ADLs. Pt is currently setup to supervision with ADLs. ADL education completed. No further OT needs indicated at this time. OT signing off.     Follow Up Recommendations  DC plan and follow up therapy as arranged by surgeon    Equipment Recommendations  3 in 1 bedside commode    Recommendations for Other Services       Precautions / Restrictions Precautions Precautions: None Restrictions Weight Bearing Restrictions: Yes LLE Weight Bearing: Weight bearing as tolerated      Mobility Bed Mobility Overal bed mobility: Independent             General bed mobility comments: up in chair  Transfers Overall transfer level: Modified independent Equipment used: Rolling walker (2 wheeled);None             General transfer comment: from recliner and 3n1. Cues for hand placement.    Balance Overall balance assessment: Needs assistance Sitting-balance support: No upper extremity supported;Feet unsupported;Feet supported Sitting balance-Leahy Scale: Normal     Standing balance support: No upper extremity supported;Bilateral upper extremity supported;During functional activity Standing balance-Leahy Scale: Fair Standing balance comment: Able to static stand with no UE support                            ADL either performed or assessed with clinical judgement   ADL Overall ADL's : Needs assistance/impaired Eating/Feeding: Set up;Sitting   Grooming: Set up;Supervision/safety;Standing   Upper Body Bathing: Set up;Sitting   Lower Body Bathing:  Supervison/ safety;Sit to/from stand   Upper Body Dressing : Set up;Sitting   Lower Body Dressing: Supervision/safety;Sit to/from stand   Toilet Transfer: Supervision/safety;Modified Independent;Ambulation;RW   Toileting- Clothing Manipulation and Hygiene: Supervision/safety;Sit to/from stand   Tub/ Shower Transfer: Walk-in shower;Supervision/safety;Ambulation;3 in 1;Rolling walker   Functional mobility during ADLs: Modified independent;Rolling walker General ADL Comments: Pt able to reach BLE for LB ADLs. Ambulated in room and completed toilet transfer.     Vision         Perception     Praxis      Pertinent Vitals/Pain Pain Assessment: 0-10 Pain Score: 5  Pain Location: L knee Pain Descriptors / Indicators: Discomfort;Sore Pain Intervention(s): Limited activity within patient's tolerance;Monitored during session;Repositioned     Hand Dominance     Extremity/Trunk Assessment Upper Extremity Assessment Upper Extremity Assessment: Overall WFL for tasks assessed   Lower Extremity Assessment Lower Extremity Assessment: Defer to PT evaluation LLE Deficits / Details: s/p L TKA; L hip flexion 4/5       Communication Communication Communication: No difficulties   Cognition Arousal/Alertness: Awake/alert Behavior During Therapy: WFL for tasks assessed/performed Overall Cognitive Status: Within Functional Limits for tasks assessed                                     General Comments       Exercises Total Joint Exercises Ankle Circles/Pumps: AROM;Both;10 reps;Supine Quad Sets: AROM;Both;10 reps;Supine Heel Slides: AROM;Left;10 reps;Supine Long Arc Quad: AROM;Left;10 reps;Seated Knee  Flexion: AAROM;Left;10 reps;Seated Marching in Standing: AROM;Left;10 reps;5 reps;Seated   Shoulder Instructions      Home Living Family/patient expects to be discharged to:: Private residence Living Arrangements: Spouse/significant other Available Help at  Discharge: Family;Available 24 hours/day Type of Home: House Home Access: Stairs to enter [employer] of Steps: 2 (2 separate lips to get inside) Entrance Stairs-Rails: None Home Layout: Two level;Able to live on main level with bedroom/bathroom     Bathroom Shower/Tub: Producer, television/film/video: Standard     Home Equipment: Crutches          Prior Functioning/Environment Level of Independence: Independent        Comments: Retired from work originally; now working in Clinical biochemist to "stay busy"        OT Problem List:        OT Treatment/Interventions:      OT Goals(Current goals can be found in the care plan section) Acute Rehab OT Goals Patient Stated Goal: Return home  OT Frequency:     Barriers to D/C:            Co-evaluation              AM-PAC PT "6 Clicks" Daily Activity     Outcome Measure Help from another person eating meals?: None Help from another person taking care of personal grooming?: None Help from another person toileting, which includes using toliet, bedpan, or urinal?: None Help from another person bathing (including washing, rinsing, drying)?: A Little Help from another person to put on and taking off regular upper body clothing?: None Help from another person to put on and taking off regular lower body clothing?: A Little 6 Click Score: 22   End of Session Equipment Utilized During Treatment: Rolling walker  Activity Tolerance: Patient tolerated treatment well Patient left: in chair;with call bell/phone within reach;Other (comment) (with yellow foam under heel)  OT Visit Diagnosis: Other abnormalities of gait and mobility (R26.89);Pain Pain - Right/Left: Left Pain - part of body: Knee                Time: 1003-1017 OT Time Calculation (min): 14 min Charges:  OT General Charges $OT Visit: 1 Visit OT Evaluation $OT Eval Low Complexity: 1 Low G-Codes:       Pilar Grammes 05/02/2017, 10:28 AM

## 2017-05-02 NOTE — Discharge Summary (Signed)
Patient ID: Fred Bond MRN: [id number] DOB/AGE: 12-23-1950 66 y.o.  Admit date: 05/01/2017 Discharge date: 05/02/2017  Admission Diagnoses:  Principal Problem:   Degenerative arthritis of left knee Active Problems:   Primary osteoarthritis of left knee   Discharge Diagnoses:  Same  Past Medical History:  Diagnosis Date  . Asthma 08/07/2012   Childhood   . Depression   . Headache   . History of iron deficiency 08/07/2012  . Hypercholesterolemia   . Hypertensive retinopathy of both eyes 03/20/2017   Seen on eye exam August 2018  . Mononucleosis    at age 48  . OA (osteoarthritis) of knee    left  . Recurrent pneumonia 08/07/2012  . Tear of meniscus of left knee 08/07/2012    Surgeries: Procedure(s): TOTAL KNEE ARTHROPLASTY on 05/01/2017   Consultants:   Discharged Condition: Improved  Hospital Course: Fred Bond is an 66 y.o. male who was admitted 05/01/2017 for operative treatment ofDegenerative arthritis of left knee. Patient has severe unremitting pain that affects sleep, daily activities, and work/hobbies. After pre-op clearance the patient was taken to the operating room on 05/01/2017 and underwent  Procedure(s): TOTAL KNEE ARTHROPLASTY.    Patient was given perioperative antibiotics: Anti-infectives    Start     Dose/Rate Route Frequency Ordered Stop   05/01/17 1344  cefUROXime (ZINACEF) injection  Status:  Discontinued       As needed 05/01/17 1344 05/01/17 1508   05/01/17 1200  ceFAZolin (ANCEF) IVPB 2g/100 mL premix     2 g 200 mL/hr over 30 Minutes Intravenous To [facility] 04/30/17 1046 05/01/17 1307       Patient was given sequential compression devices, early ambulation, and chemoprophylaxis to prevent DVT.  Patient benefited maximally from hospital stay and there were no complications.    Recent vital signs: Patient Vitals for the past 24 hrs:  BP Temp Temp src Pulse Resp SpO2  05/02/17 0905 115/63 97.9 F (36.6 C) Oral (!) 57 16 95 %   05/02/17 0300 110/72 (!) 97.5 F (36.4 C) Axillary (!) 51 17 95 %  05/02/17 0110 106/66 - - (!) 55 16 93 %  05/01/17 2037 120/79 97.9 F (36.6 C) Oral 60 17 98 %  05/01/17 1740 119/74 98.1 F (36.7 C) - (!) 52 10 98 %  05/01/17 1640 124/87 - - (!) 54 20 98 %  05/01/17 1625 120/76 - - (!) 49 12 99 %  05/01/17 1610 127/76 - - (!) 49 15 99 %  05/01/17 1555 115/80 - - (!) 50 14 99 %  05/01/17 1540 109/70 - - (!) 59 18 98 %  05/01/17 1525 99/68 - - (!) 53 (!) 7 100 %  05/01/17 1515 104/65 (!) 97 F (36.1 C) - (!) 55 13 98 %     Recent laboratory studies:  Recent Labs  05/02/17 0512  WBC 11.9*  HGB 11.8*  HCT 34.6*  PLT 173  NA 134*  K 4.3  CL 102  CO2 25  BUN 12  CREATININE 0.80  GLUCOSE 158*  CALCIUM 8.6*     Discharge Medications:   Allergies as of 05/02/2017   No Known Allergies     Medication List    STOP taking these medications   acetaminophen 500 MG tablet Commonly known as:  TYLENOL   aspirin 81 MG tablet Replaced by:  aspirin EC 325 MG tablet   diclofenac sodium 1 % Gel Commonly known as:  VOLTAREN     TAKE these  medications   AMBULATORY NON FORMULARY MEDICATION flinstone vitamin   aspirin EC 325 MG tablet Take 1 tablet (325 mg total) by mouth 2 (two) times daily. Replaces:  aspirin 81 MG tablet   CENTRUM SILVER ULTRA MENS Tabs Take 1 tablet by mouth daily.   clobetasol ointment 0.05 % Commonly known as:  TEMOVATE Apply 1 application topically daily as needed (ezcema).   D 2000 2000 units Tabs Generic drug:  Cholecalciferol Take 2,000 Units by mouth daily.   escitalopram 20 MG tablet Commonly known as:  LEXAPRO Take 1 tablet (20 mg total) by mouth daily. What changed:  when to take this   flintstones complete 60 MG chewable tablet Chew 1 tablet by mouth daily. With iron   L-Lysine 500 MG Tabs Take 500 mg by mouth daily.   niacin 500 MG tablet Take 500 mg by mouth daily with breakfast.   oxyCODONE-acetaminophen 5-325 MG  tablet Commonly known as:  ROXICET Take 1 tablet by mouth every 4 (four) hours as needed.   pravastatin 40 MG tablet Commonly known as:  PRAVACHOL TAKE 1 TABLET (40 MG TOTAL) BY MOUTH DAILY.   sildenafil 20 MG tablet Commonly known as:  REVATIO 1-4 tabs by mouth as needed for intercourse. What changed:  how much to take  how to take this  when to take this  reasons to take this  additional instructions   tiZANidine 2 MG tablet Commonly known as:  ZANAFLEX Take 1 tablet (2 mg total) by mouth every 6 (six) hours as needed for muscle spasms.            Durable Medical Equipment        Start     Ordered   05/01/17 1843  DME Walker rolling  Once    Question:  Patient needs a walker to treat with the following condition  Answer:  Status post total left knee replacement   05/01/17 1842   05/01/17 1843  DME 3 n 1  Once     05/01/17 1842   05/01/17 1843  DME Bedside commode  Once    Question:  Patient needs a bedside commode to treat with the following condition  Answer:  Status post total left knee replacement   05/01/17 1842      Diagnostic Studies: No results found.  Disposition: 01-Home or Self Care  Discharge Instructions    Call MD / Call 911    Complete by:  As directed    If you experience chest pain or shortness of breath, CALL 911 and be transported to the hospital emergency room.  If you develope a fever above 101 F, pus (white drainage) or increased drainage or redness at the wound, or calf pain, call your surgeon's office.   Constipation Prevention    Complete by:  As directed    Drink plenty of fluids.  Prune juice may be helpful.  You may use a stool softener, such as Colace (over the counter) 100 mg twice a day.  Use MiraLax (over the counter) for constipation as needed.   Diet - low sodium heart healthy    Complete by:  As directed    Driving restrictions    Complete by:  As directed    No driving for 2 weeks   Increase activity slowly as  tolerated    Complete by:  As directed    Patient may shower    Complete by:  As directed    You may shower without a  dressing once there is no drainage.  Do not wash over the wound.  If drainage remains, cover wound with plastic wrap and then shower.      Follow-up Information    Gean Birchwood, MD Follow up in 2 week(s).   Specialty:  Orthopedic Surgery Contact information: [address] [place] [address] [phone]            Signed: Vear Clock Chasyn Cinque R 05/02/2017, 2:14 PM

## 2017-05-02 NOTE — Evaluation (Signed)
Physical Therapy Evaluation Patient Details Name: Fred Bond MRN: [id number] DOB: 01-01-1951 Today's Date: 05/02/2017   History of Present Illness  PPt is a 66 y.o. male now s/p elective L TKA on 05/01/17. Pertinent PMH includes bilateral hypertensive retinopathy, OA, asthma.    Clinical Impression  Patient evaluated by Physical Therapy with no further acute PT needs identified. Mod indep with RW for transfers, amb 1000', and ascend/descending steps. Educ on seated/supine therex, precautions, positioning (including bone foam use), fall risk reduction, and importance of mobility. All education has been completed and the patient has no further questions. Pt will have 24/7 assist available from wife at home if needed. Feel pt is safe to d/c home with follow-up for HHPT. Acute PT is signing off. Thank you for this referral.    Follow Up Recommendations DC plan and follow up therapy as arranged by surgeon    Equipment Recommendations  3in1 (PT);Other (comment) (may have RW)    Recommendations for Other Services       Precautions / Restrictions Precautions Precautions: None Restrictions Weight Bearing Restrictions: Yes LLE Weight Bearing: Weight bearing as tolerated      Mobility  Bed Mobility Overal bed mobility: Independent                Transfers Overall transfer level: Modified independent Equipment used: Rolling walker (2 wheeled);None             General transfer comment: Indep for standing with and without RW; encouraged to use RW for now to decrease fall risk post-sx. Educ on technique and hand placement  Ambulation/Gait Ambulation/Gait assistance: Modified independent (Device/Increase time) Ambulation Distance (Feet): 1000 Feet Assistive device: Rolling walker (2 wheeled) Gait Pattern/deviations: Step-through pattern;Decreased stride length     General Gait Details: Great initial technique with amb, with step-through and heel-to-toe gait pattern. Able  to increase/decrease speed with ease  Stairs Stairs: Yes Stairs assistance: Modified independent (Device/Increase time) Stair Management: Backwards;Forwards;With walker Number of Stairs: 6 General stair comments: Ascend/descended 2 individual steps with RW to simulate to "lips" pt has to ascend to get into home; ascend/descended 4 additional steps with UE support on L-rail   Wheelchair Mobility    Modified Rankin (Stroke Patients Only)       Balance Overall balance assessment: Needs assistance Sitting-balance support: No upper extremity supported;Feet unsupported;Feet supported Sitting balance-Leahy Scale: Normal     Standing balance support: No upper extremity supported;Bilateral upper extremity supported;During functional activity Standing balance-Leahy Scale: Fair Standing balance comment: Able to static stand with no UE support                              Pertinent Vitals/Pain Pain Assessment: 0-10 Pain Score: 2  Pain Location: L knee Pain Descriptors / Indicators: Discomfort;Sore Pain Intervention(s): Monitored during session    Home Living Family/patient expects to be discharged to:: Private residence Living Arrangements: Spouse/significant other Available Help at Discharge: Family;Available 24 hours/day Type of Home: House Home Access: Stairs to enter Entrance Stairs-Rails: None Entrance Stairs-Number of Steps: 2 (2 separate lips to get inside) Home Layout: Two level;Able to live on main level with bedroom/bathroom Home Equipment: Crutches      Prior Function Level of Independence: Independent         Comments: Retired from work originally; 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        Extremity/Trunk Assessment   Upper  Extremity Assessment Upper Extremity Assessment: Overall WFL for tasks assessed    Lower Extremity Assessment Lower Extremity Assessment: LLE deficits/detail LLE Deficits / Details: s/p L  TKA; L hip flexion 4/5       Communication   Communication: No difficulties  Cognition Arousal/Alertness: Awake/alert Behavior During Therapy: WFL for tasks assessed/performed Overall Cognitive Status: Within Functional Limits for tasks assessed                                        General Comments      Exercises Total Joint Exercises Ankle Circles/Pumps: AROM;Both;10 reps;Supine Quad Sets: AROM;Both;10 reps;Supine Heel Slides: AROM;Left;10 reps;Supine Long Arc Quad: AROM;Left;10 reps;Seated Knee Flexion: AAROM;Left;10 reps;Seated Marching in Standing: AROM;Left;10 reps;5 reps;Seated   Assessment/Plan    PT Assessment Patent does not need any further PT services  PT Problem List         PT Treatment Interventions      PT Goals (Current goals can be found in the Care Plan section)  Acute Rehab PT Goals Patient Stated Goal: Return home PT Goal Formulation: With patient Time For Goal Achievement: 05/16/17 Potential to Achieve Goals: Good    Frequency     Barriers to discharge        Co-evaluation               AM-PAC PT "6 Clicks" Daily Activity  Outcome Measure Difficulty turning over in bed (including adjusting bedclothes, sheets and blankets)?: None Difficulty moving from lying on back to sitting on the side of the bed? : None Difficulty sitting down on and standing up from a chair with arms (e.g., wheelchair, bedside commode, etc,.)?: None Help needed moving to and from a bed to chair (including a wheelchair)?: None Help needed walking in hospital room?: None Help needed climbing 3-5 steps with a railing? : None 6 Click Score: 24    End of Session Equipment Utilized During Treatment: Gait belt Activity Tolerance: Patient tolerated treatment well Patient left: in chair;with call bell/phone within reach Nurse Communication: Mobility status PT Visit Diagnosis: Other abnormalities of gait and mobility (R26.89);Pain Pain -  Right/Left: Left Pain - part of body: Knee    Time: 0746-0820 PT Time Calculation (min) (ACUTE ONLY): 34 min   Charges:   PT Evaluation $PT Eval Low Complexity: 1 Low PT Treatments $Gait Training: 8-22 mins   PT G Codes:       Ina Homes, PT, DPT Acute Rehab Services  Pager: [phone]  Malachy Chamber 05/02/2017, 8:36 AM

## 2017-05-02 NOTE — Progress Notes (Signed)
Patient discharged to home with family member.  IV removed. Given discharge instructions and prescriptions. Patient verbally understood and is at a comfortable pain level

## 2017-07-16 ENCOUNTER — Other Ambulatory Visit: Payer: Self-pay | Admitting: Family Medicine

## 2017-08-10 ENCOUNTER — Other Ambulatory Visit: Payer: Self-pay | Admitting: Family Medicine

## 2017-08-21 ENCOUNTER — Other Ambulatory Visit: Payer: Self-pay

## 2017-08-21 MED ORDER — PRAVASTATIN SODIUM 40 MG PO TABS: 40.0000 mg | ORAL_TABLET | Freq: Every day | ORAL | 0 refills | Status: DC

## 2017-08-21 MED ORDER — ESCITALOPRAM OXALATE 20 MG PO TABS: 20.0000 mg | ORAL_TABLET | Freq: Every day | ORAL | 0 refills | Status: DC

## 2017-08-21 NOTE — Telephone Encounter (Signed)
Patient had to switch pharmacy [facility]. Estelle Junehonda Cunningham,CMA

## 2017-09-10 ENCOUNTER — Ambulatory Visit: Payer: PRIVATE HEALTH INSURANCE | Admitting: Family Medicine

## 2017-09-11 ENCOUNTER — Encounter: Payer: Self-pay | Admitting: Family Medicine

## 2017-09-11 ENCOUNTER — Ambulatory Visit (INDEPENDENT_AMBULATORY_CARE_PROVIDER_SITE_OTHER): Payer: PRIVATE HEALTH INSURANCE | Admitting: Family Medicine

## 2017-09-11 VITALS — BP 116/71 | HR 70 | Ht 71.0 in | Wt 192.0 lb

## 2017-09-11 DIAGNOSIS — F320 Major depressive disorder, single episode, mild: Secondary | ICD-10-CM | POA: Diagnosis not present

## 2017-09-11 DIAGNOSIS — L439 Lichen planus, unspecified: Secondary | ICD-10-CM | POA: Diagnosis not present

## 2017-09-11 DIAGNOSIS — E785 Hyperlipidemia, unspecified: Secondary | ICD-10-CM | POA: Diagnosis not present

## 2017-09-11 MED ORDER — ESCITALOPRAM OXALATE 20 MG PO TABS: 20.0000 mg | ORAL_TABLET | Freq: Every day | ORAL | 1 refills | Status: DC

## 2017-09-11 MED ORDER — PRAVASTATIN SODIUM 40 MG PO TABS: 40.0000 mg | ORAL_TABLET | Freq: Every day | ORAL | 1 refills | Status: DC

## 2017-09-11 NOTE — Patient Instructions (Signed)
Thank you for coming in today. 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 It could also be a skin fungus.   USe the clobetasol cream twice daily until it normalizes.  Use otc terbinafine cream (lamisil) twice daily for 2-4 weeks.   Return in June or July for a "welcome to medicare" well exam.    Lichen Planus Lichen planus is a skin problem that causes redness, itching, small bumps, and sores. It can affect the skin in any area of the body. Some common areas affected include:  Arms, wrists, legs, or ankles.  Chest, back, or abdomen.  Genital areas such as the vulva and vagina.  Gums and inside of the mouth.  Scalp.  Fingernails or toenails.  Treatment can help control the symptoms of this condition. The condition can last for a long time. It can take 6-18 months for it to go away. What are the causes? The exact cause of this condition is not known. The condition is not passed from one person to another (not contagious). It may be related to an allergy or an autoimmune response. An autoimmune response occurs when the body's defense system (immune system) mistakenly attacks healthy tissues. What increases the risk? This condition is more likely to develop in:  People who are older than 65 years of age.  People who take certain medicines.  People who have been exposed to certain dyes or chemicals.  People with hepatitis C.  What are the signs or symptoms? Symptoms of this condition may include:  Itching, which can be severe.  Small reddish or purple bumps on the skin. These may have flat tops and may be round or irregular shaped.  Redness or white patches on the gums or tongue.  Redness, soreness, or a burning feeling in the genital area. This may lead to pain or bleeding during sex.  Changes in the fingernails or toenails. The nails may become thin or rough. They may have ridges in them.  Redness or irritation of the eyes. This is rare.  How is this diagnosed? This  condition may be diagnosed based on:  A physical exam. The health care provider will examine your affected skin and check for changes inside your mouth.  Removal of a tissue sample (biopsy sample) to be looked at under a microscope.  How is this treated? Treatment for this condition may depend on the severity of symptoms. In some cases, no treatment is needed. If treatment is needed to control symptoms, it may include:  Creams or ointments (topical steroids) to help control itching and irritation.  Medicine to be taken by mouth.  A treatment in which your skin is exposed to ultraviolet light (phototherapy).  Lozenges that you suck on to help treat sores in the mouth.  Follow these instructions at home:  Take over-the-counter and prescription medicines only as told by your health care provider.  Use creams or ointments as told by your health care provider.  Do not scratch the affected areas of skin.  Women should keep the vaginal area as clean and dry as possible. Contact a health care provider if:  You have increasing redness, swelling, or pain in the affected area.  You have fluid, blood, or pus coming from the affected area.  Your eyes become irritated. This information is not intended to replace advice given to you by your health care provider. Make sure you discuss any questions you have with your health care provider. Document Released: 12/07/2010 Document Revised: 12/22/2015 Document Reviewed: 10/11/2014  [employer]vier Interactive Patient Education  2018 [employer]  

## 2017-09-11 NOTE — Progress Notes (Signed)
Fred Bond is a 67 y.o. male who presents to [facility] today for rash right leg.  She also has a month long history of itchy rash on his right shin.  He denies any new exposures medication skin hair or nail changes.  He has not had any treatment for this yet.  He feels well with no fevers or chills or body aches.  He denies any other rash or mucocutaneous involvement.  Hyperlipidemia: Fred Bond needs a refill of his pravastatin.  He takes a daily and notes it works well and he denies any significant muscle aches or pain.  Depression: Well with Lexapro.  He needs a refill.  Tolerates medication well with no issues.   Past Medical History:  Diagnosis Date  . Asthma 08/07/2012   Childhood   . Depression   . Headache   . History of iron deficiency 08/07/2012  . Hypercholesterolemia   . Hypertensive retinopathy of both eyes 03/20/2017   Seen on eye exam August 2018  . Mononucleosis    at age 31  . OA (osteoarthritis) of knee    left  . Recurrent pneumonia 08/07/2012  . Tear of meniscus of left knee 08/07/2012   Past Surgical History:  Procedure Laterality Date  . COLONOSCOPY W/ BIOPSIES AND POLYPECTOMY    . EYE SURGERY    . KNEE ARTHROSCOPY     left 2014  . TONSILLECTOMY    . 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   Procedure: TOTAL KNEE ARTHROPLASTY;  Surgeon: Gean Birchwood, MD;  Location: [facility] OR;  Service: Orthopedics;  Laterality: Left;  . WISDOM TOOTH EXTRACTION     Social History   Tobacco Use  . Smoking status: Never Smoker  . Smokeless tobacco: Never Used  Substance Use Topics  . Alcohol use: No   family history includes Alcohol abuse in his unknown relative; Cancer in his mother; Heart attack in his unknown relative; Melanoma in his father and mother.  ROS as above:  Medications: Current Outpatient  Medications  Medication Sig Dispense Refill  . AMBULATORY NON FORMULARY MEDICATION flinstone vitamin    . aspirin 81 MG chewable tablet Chew 81 mg by mouth daily.    . Cholecalciferol (D 2000) 2000 units TABS Take 2,000 Units by mouth daily.    . clobetasol ointment (TEMOVATE) 0.05 % Apply 1 application topically daily as needed (ezcema).    . escitalopram (LEXAPRO) 20 MG tablet Take 1 tablet (20 mg total) by mouth daily. APPOINTMENT NEEDED FOR FURTHER REFILLS 90 tablet 1  . flintstones complete (FLINTSTONES) 60 MG chewable tablet Chew 1 tablet by mouth daily. With iron    . L-Lysine 500 MG TABS Take 500 mg by mouth daily.     . Multiple Vitamins-Minerals (CENTRUM SILVER ULTRA MENS) TABS Take 1 tablet by mouth daily.     . niacin 500 MG tablet Take 500 mg by mouth daily with breakfast.    . pravastatin (PRAVACHOL) 40 MG tablet Take 1 tablet (40 mg total) by mouth daily. 90 tablet 1  . sildenafil (REVATIO) 20 MG tablet 1-4 tabs by mouth as needed for intercourse. (Patient taking differently: Take 40 mg by mouth as needed. 2 tabs by mouth as needed for intercourse.) 50 tablet 11   No current facility-administered medications for this visit.    No Known Allergies  Health Maintenance Health Maintenance  Topic Date Due  . TETANUS/TDAP  03/30/2024  . COLONOSCOPY  06/29/2025  . INFLUENZA VACCINE  Completed  . Hepatitis C Screening  Completed  . HIV Screening  Completed     Exam:  BP 116/71   Pulse 70   Ht 5\' 11"  (1.803 m)   Wt 192 lb (87.1 kg)   BMI 26.78 kg/m  Gen: Well NAD HEENT: EOMI,  MMM Lungs: Normal work of breathing. CTABL Heart: RRR no MRG Abd: NABS, Soft. Nondistended, Nontender Exts: Brisk capillary refill, warm and well perfused.  Skin: Flat erythematous polyclonal rash on right leg.  Nonblanching nontender some scale present. Psych: Alert and oriented normal speech thought process and affect. [facility] screen [facility] 2/[facility] 09/11/2017 03/20/2017 02/12/2017  Decreased  Interest 0 0 1  Down, Depressed, Hopeless 0 1 1  PHQ - 2 Score 0 1 2  Altered sleeping 1 1 1   Tired, decreased energy 1 1 2   Change in appetite 0 0 0  Feeling bad or failure about yourself  0 0 0  Trouble concentrating 0 0 0  Moving slowly or fidgety/restless 0 0 0  Suicidal thoughts 0 0 0  PHQ-9 Score 2 3 5   Difficult doing work/chores Not difficult at all - -      No results found for this or any previous visit (from the past 72 hour(s)). No results found.    Assessment and Plan: 67 y.o. 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  Dermatophyte is certainly a possibility.  Will treat with post clobetasol cream and terbinafine cream.  Recheck if not improving.  Hyperlipidemia doing well continue current medications.  Medication refilled.  We will recheck lipids with well visit later this year.  Depression: Doing great.  PHQ 9 is 2 which is extremely well controlled.  He tolerates his medication well.  Continue current regimen.  Medication refilled.   No orders of the defined types were placed in this encounter.  Meds ordered this encounter  Medications  . escitalopram (LEXAPRO) 20 MG tablet    Sig: Take 1 tablet (20 mg total) by mouth daily. APPOINTMENT NEEDED FOR FURTHER REFILLS    Dispense:  90 tablet    Refill:  1  . pravastatin (PRAVACHOL) 40 MG tablet    Sig: Take 1 tablet (40 mg total) by mouth daily.    Dispense:  90 tablet    Refill:  1     Discussed warning signs or symptoms. Please see discharge instructions. Patient expresses understanding.

## 2018-05-13 ENCOUNTER — Encounter: Payer: Self-pay | Admitting: Family Medicine

## 2018-05-13 ENCOUNTER — Ambulatory Visit (INDEPENDENT_AMBULATORY_CARE_PROVIDER_SITE_OTHER): Payer: Medicare Other | Admitting: Family Medicine

## 2018-05-13 ENCOUNTER — Ambulatory Visit (INDEPENDENT_AMBULATORY_CARE_PROVIDER_SITE_OTHER): Payer: Medicare Other

## 2018-05-13 VITALS — BP 123/67 | HR 68 | Ht 71.0 in | Wt 196.0 lb

## 2018-05-13 DIAGNOSIS — IMO0001 Reserved for inherently not codable concepts without codable children: Secondary | ICD-10-CM

## 2018-05-13 DIAGNOSIS — D692 Other nonthrombocytopenic purpura: Secondary | ICD-10-CM | POA: Diagnosis not present

## 2018-05-13 DIAGNOSIS — R911 Solitary pulmonary nodule: Secondary | ICD-10-CM

## 2018-05-13 DIAGNOSIS — H35033 Hypertensive retinopathy, bilateral: Secondary | ICD-10-CM

## 2018-05-13 DIAGNOSIS — Z23 Encounter for immunization: Secondary | ICD-10-CM

## 2018-05-13 DIAGNOSIS — Z0000 Encounter for general adult medical examination without abnormal findings: Secondary | ICD-10-CM | POA: Diagnosis not present

## 2018-05-13 DIAGNOSIS — Z6827 Body mass index (BMI) 27.0-27.9, adult: Secondary | ICD-10-CM

## 2018-05-13 DIAGNOSIS — Z125 Encounter for screening for malignant neoplasm of prostate: Secondary | ICD-10-CM

## 2018-05-13 DIAGNOSIS — Z8639 Personal history of other endocrine, nutritional and metabolic disease: Secondary | ICD-10-CM

## 2018-05-13 DIAGNOSIS — R351 Nocturia: Secondary | ICD-10-CM

## 2018-05-13 DIAGNOSIS — I700 Atherosclerosis of aorta: Secondary | ICD-10-CM | POA: Diagnosis not present

## 2018-05-13 DIAGNOSIS — J4520 Mild intermittent asthma, uncomplicated: Secondary | ICD-10-CM

## 2018-05-13 DIAGNOSIS — F320 Major depressive disorder, single episode, mild: Secondary | ICD-10-CM

## 2018-05-13 MED ORDER — ZOSTER VAC RECOMB ADJUVANTED 50 MCG/0.5ML IM SUSR: 0.5000 mL | Freq: Once | INTRAMUSCULAR | 1 refills | Status: AC

## 2018-05-13 MED ORDER — TADALAFIL 20 MG PO TABS: 10.0000 mg | ORAL_TABLET | ORAL | 11 refills | Status: DC | PRN

## 2018-05-13 NOTE — Progress Notes (Signed)
HPI: Fred Bond is a 67 y.o. male  who presents to [name] today, 05/13/18,  for Medicare Annual Wellness Exam  Patient presents for annual physical/Medicare wellness exam. no complaints today.   Past medical, surgical, social and family history reviewed:  Patient Active Problem List   Diagnosis Date Noted  . Hypertensive retinopathy of both eyes 03/20/2017  . Depression, major, single episode, mild (HCC) 02/12/2017  . Lung nodule < 6cm on CT 10/03/2016  . Retinal tear of both eyes 09/25/2016  . Bone marrow edema 09/05/2016  . Stress at home 03/26/2016  . Senile purpura (HCC) 02/18/2014  . 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 Hyperlipidemia with target low density lipoprotein (LDL) cholesterol less than 130 mg/dL 16/04/9603  . History of iron deficiency 08/07/2012  . Asthma 08/07/2012    Past Surgical History:  Procedure Laterality Date  . COLONOSCOPY W/ BIOPSIES AND POLYPECTOMY    . EYE SURGERY    . KNEE ARTHROSCOPY     left 2014  . TONSILLECTOMY    . 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   Procedure: TOTAL KNEE ARTHROPLASTY;  Surgeon: Gean Birchwood, MD;  Location: [facility] OR;  Service: Orthopedics;  Laterality: Left;  . WISDOM TOOTH EXTRACTION      Social History   Socioeconomic History  . Marital status: Married    Spouse name: Not on file  . Number of children: Not on file  . Years of education: Not on file  . Highest education level: Not on file  Occupational History  . Not on file  Social Needs  . Financial resource strain: Not on file  . Food insecurity:    Worry: Not on file    Inability: Not on file  . Transportation needs:    Medical: Not on file    Non-medical: Not on file  Tobacco Use  . Smoking status: Never Smoker  . Smokeless tobacco: Never Used  Substance and Sexual Activity  . Alcohol use: No  . Drug use: No  . Sexual  activity: Not on file  Lifestyle  . Physical activity:    Days per week: Not on file    Minutes per session: Not on file  . Stress: Not on file  Relationships  . Social connections:    Talks on phone: Not on file    Gets together: Not on file    Attends religious service: Not on file    Active member of club or organization: Not on file    Attends meetings of clubs or organizations: Not on file    Relationship status: Not on file  . Intimate partner violence:    Fear of current or ex partner: Not on file    Emotionally abused: Not on file    Physically abused: Not on file    Forced sexual activity: Not on file  Other Topics Concern  . Not on file  Social History Narrative  . Not on file    Family History  Problem Relation Age of Onset  . Alcohol abuse Unknown        grandfather  . Heart attack Unknown        grandparents  . Cancer Mother   . Melanoma Mother   . Melanoma Father      Current medication list and allergy/intolerance information reviewed:    Outpatient Encounter Medications as of 05/13/2018  Medication Sig  . AMBULATORY  NON FORMULARY MEDICATION flinstone vitamin  . Cholecalciferol (D 2000) 2000 units TABS Take 2,000 Units by mouth daily.  Marland Kitchen escitalopram (LEXAPRO) 20 MG tablet Take 1 tablet (20 mg total) by mouth daily. APPOINTMENT NEEDED FOR FURTHER REFILLS  . flintstones complete (FLINTSTONES) 60 MG chewable tablet Chew 1 tablet by mouth daily. With iron  . L-Lysine 500 MG TABS Take 500 mg by mouth daily.   . Multiple Vitamins-Minerals (CENTRUM SILVER ULTRA MENS) TABS Take 1 tablet by mouth daily.   . niacin 500 MG tablet Take 500 mg by mouth daily with breakfast.  . pravastatin (PRAVACHOL) 40 MG tablet Take 1 tablet (40 mg total) by mouth daily.  . sildenafil (REVATIO) 20 MG tablet 1-4 tabs by mouth as needed for intercourse. (Patient taking differently: Take 40 mg by mouth as needed. 2 tabs by mouth as needed for intercourse.)  . [DISCONTINUED] aspirin  81 MG chewable tablet Chew 81 mg by mouth daily.  . [DISCONTINUED] clobetasol ointment (TEMOVATE) 0.05 % Apply 1 application topically daily as needed (ezcema).   No facility-administered encounter medications on file as of 05/13/2018.     No Known Allergies     Review of Systems: No headache, visual changes, nausea, vomiting, diarrhea, constipation, dizziness, abdominal pain, skin rash, fevers, chills, night sweats, weight loss, swollen lymph nodes, body aches, joint swelling, muscle aches, chest pain, shortness of breath, mood changes, visual or auditory hallucinations.     Medicare Wellness Questionnaire  Are there smokers in your home (other than you)? no  Depression screen [facility] 2/9 05/13/2018 09/11/2017 03/20/2017 02/12/2017  Decreased Interest 0 0 0 1  Down, Depressed, Hopeless 0 0 1 1  PHQ - 2 Score 0 0 1 2  Altered sleeping 1 1 1 1   Tired, decreased energy 0 1 1 2   Change in appetite 0 0 0 0  Feeling bad or failure about yourself  0 0 0 0  Trouble concentrating 0 0 0 0  Moving slowly or fidgety/restless 0 0 0 0  Suicidal thoughts 0 0 0 0  PHQ-9 Score 1 2 3 5   Difficult doing work/chores Not difficult at all Not difficult at all - -        Activities of Daily Living In your present state of health, do you have any difficulty performing the following activities?:  Driving? no Managing money?  no Feeding yourself? no Getting from bed to chair? no Climbing a flight of stairs? no Preparing food and eating?: no Bathing or showering? no Getting dressed: no Getting to the toilet? no Using the toilet: no Moving around from place to place: no In the past year have you fallen or had a near fall?: no  Hearing Difficulties:  Do you often ask people to speak up or repeat themselves? no Do you experience ringing or noises in your ears? yes  Do you have difficulty understanding soft or whispered voices? yes  Memory Difficulties:  Do you feel that you have a problem with  memory? yes  Do you often misplace items? no  Do you feel safe at home?  yes  Sexual Health:   Are you sexually active?  Yes  Do you have more than one partner?  No   Risk Factors  Current exercise habits: Walking  Dietary issues discussed:yes  Cardiac risk factors: present   Exam:  BP 123/67   Pulse 68   Ht 5\' 11"  (1.803 m)   Wt 196 lb (88.9 kg)   BMI 27.34 kg/m  Vision by Snellen chart: right eye:see nurse notes, left eye:see nurse notes  Constitutional: VS see above. General Appearance: alert, well-developed, well-nourished, NAD  Ears, Nose, Mouth, Throat: MMM  Neck: No masses, trachea midline.   Respiratory: Normal respiratory effort. no wheeze, no rhonchi, no rales  Cardiovascular:No lower extremity edema.   Musculoskeletal: Gait normal. No clubbing/cyanosis of digits.   Neurological: Normal balance/coordination. No tremor. Recalls 3 objects and able to read face of watch with correct time.   Skin: warm, dry, intact. No rash/ulcer.   Psychiatric: Normal judgment/insight. Normal mood and affect. Oriented x3.    ASSESSMENT/PLAN:   Encounter for Medicare annual wellness exam  Will obtain basic laboratory work-up and basic labs listed below. We will administer pneumonia 13 vaccine today. Check CBC metabolic panel lipid panel and PSA. Has history of lung nodule and was due for recheck CT scan last year.  This was never completed and will be ordered today.  Will attempt to do low dose CT scan if possible. Recheck with me yearly if all is well return sooner if needed.  Orders Placed This Encounter  Procedures  . CT CHEST WO CONTRAST    Standing Status:   Future    Number of Occurrences:   1    Standing Expiration Date:   07/14/2019    Order Specific Question:   Preferred imaging location?    Answer:   [facility]    Order Specific Question:   Radiology Contrast Protocol - do NOT remove file path    Answer:    [email or web address]  . Pneumococcal conjugate vaccine 13-valent  . CBC  . COMPLETE METABOLIC PANEL WITH GFR  . Lipid Panel w/reflex Direct LDL  . PSA     Health Maintenance Health Maintenance  Topic Date Due  . PNA vac Low Risk Adult (1 of 2 - PCV13) 12/25/2017  . INFLUENZA VACCINE  02/27/2018  . TETANUS/TDAP  03/30/2024  . COLONOSCOPY  06/29/2025  . Hepatitis C Screening  Completed  . HIV Screening  Completed    Immunization History  Administered Date(s) Administered  . Influenza, High Dose Seasonal PF 05/01/2018  . Influenza,inj,Quad PF,6+ Mos 03/26/2016  . Influenza-Unspecified 05/18/2017  . Td 07/31/2003  . Tdap 03/30/2014     During the course of the visit the patient was educated and counseled about appropriate screening and preventive services as noted above.   Patient Instructions (the written plan) was given to the patient.  Medicare Attestation I have personally reviewed: The patient's medical and social history Their use of alcohol, tobacco or illicit drugs Their current medications and supplements The patient's functional ability including ADLs,fall risks, home safety risks, cognitive, and hearing and visual impairment Diet and physical activities Evidence for depression or mood disorders  The patient's weight, height, BMI, and visual acuity have been recorded in the chart.  I have made referrals, counseling, and provided education to the patient based on review of the above and I have provided the patient with a written personalized care plan for preventive services.

## 2018-05-13 NOTE — Patient Instructions (Addendum)
Thank you for coming in today. Switch to [place].  Get fasting labs soon.  Recheck with me yearly if all is well.  You should hear about the CT scan soon.   Return sooner if needed.

## 2018-05-14 ENCOUNTER — Telehealth: Payer: Self-pay | Admitting: Family Medicine

## 2018-05-14 ENCOUNTER — Encounter: Payer: Self-pay | Admitting: Family Medicine

## 2018-05-14 DIAGNOSIS — I712 Thoracic aortic aneurysm, without rupture, unspecified: Secondary | ICD-10-CM

## 2018-05-14 NOTE — Telephone Encounter (Signed)
As discussed in result note.  Will order echocardiogram to evaluate thoracic aortic aneurysm in more detail to make sure there is no valve problem associated with it.

## 2018-05-14 NOTE — Telephone Encounter (Signed)
Patient advised of results and recommendations.  

## 2018-05-20 LAB — PSA: PSA: 1.5 ng/mL (ref <=4.0)

## 2018-05-20 LAB — COMPLETE METABOLIC PANEL WITH GFR
AG RATIO: 1.5 (calc) (ref 1.0–2.5)
ALBUMIN MSPROF: 4 g/dL (ref 3.6–5.1)
ALT: 28 U/L (ref 9–46)
AST: 33 U/L (ref 10–35)
Alkaline phosphatase (APISO): 63 U/L (ref 40–115)
BUN: 15 mg/dL (ref 7–25)
CO2: 27 mmol/L (ref 20–32)
CREATININE: 0.97 mg/dL (ref 0.70–1.25)
Calcium: 8.8 mg/dL (ref 8.6–10.3)
Chloride: 102 mmol/L (ref 98–110)
GFR, EST AFRICAN AMERICAN: 95 mL/min/{1.73_m2} (ref >=60)
GFR, EST NON AFRICAN AMERICAN: 82 mL/min/{1.73_m2} (ref >=60)
GLOBULIN: 2.6 g/dL (ref 1.9–3.7)
Glucose, Bld: 91 mg/dL (ref 65–99)
Potassium: 4.3 mmol/L (ref 3.5–5.3)
SODIUM: 136 mmol/L (ref 135–146)
Total Bilirubin: 0.4 mg/dL (ref 0.2–1.2)
Total Protein: 6.6 g/dL (ref 6.1–8.1)

## 2018-05-20 LAB — CBC
HEMATOCRIT: 38.6 % (ref 38.5–50.0)
HEMOGLOBIN: 13.2 g/dL (ref 13.2–17.1)
MCH: 33.1 pg — ABNORMAL HIGH (ref 27.0–33.0)
MCHC: 34.2 g/dL (ref 32.0–36.0)
MCV: 96.7 fL (ref 80.0–100.0)
MPV: 8.8 fL (ref 7.5–12.5)
Platelets: 228 10*3/uL (ref 140–400)
RBC: 3.99 10*6/uL — ABNORMAL LOW (ref 4.20–5.80)
RDW: 12.7 % (ref 11.0–15.0)
WBC: 4.4 10*3/uL (ref 3.8–10.8)

## 2018-05-20 LAB — LIPID PANEL W/REFLEX DIRECT LDL
Cholesterol: 161 mg/dL (ref <200)
HDL: 64 mg/dL (ref >40)
LDL Cholesterol (Calc): 83 mg/dL (calc)
NON-HDL CHOLESTEROL (CALC): 97 mg/dL (ref <130)
Total CHOL/HDL Ratio: 2.5 (calc) (ref <5.0)
Triglycerides: 62 mg/dL (ref <150)

## 2018-05-21 ENCOUNTER — Other Ambulatory Visit: Payer: Self-pay | Admitting: Family Medicine

## 2018-05-26 ENCOUNTER — Ambulatory Visit (HOSPITAL_BASED_OUTPATIENT_CLINIC_OR_DEPARTMENT_OTHER)
Admission: RE | Admit: 2018-05-26 | Discharge: 2018-05-26 | Disposition: A | Discharge status: Home / Self Care | Payer: PRIVATE HEALTH INSURANCE | Source: Ambulatory Visit | Attending: Family Medicine | Admitting: Family Medicine

## 2018-05-26 DIAGNOSIS — I712 Thoracic aortic aneurysm, without rupture, unspecified: Secondary | ICD-10-CM

## 2018-05-26 DIAGNOSIS — E785 Hyperlipidemia, unspecified: Secondary | ICD-10-CM | POA: Diagnosis not present

## 2018-05-26 DIAGNOSIS — I081 Rheumatic disorders of both mitral and tricuspid valves: Secondary | ICD-10-CM | POA: Diagnosis not present

## 2018-05-26 NOTE — Telephone Encounter (Signed)
Referral to vascular surgery ordered as we discussed.

## 2018-05-26 NOTE — Progress Notes (Signed)
  Echocardiogram 2D Echocardiogram has been performed.  Salimata Christenson T Paublo Warshawsky 05/26/2018, 8:53 AM

## 2018-07-02 ENCOUNTER — Institutional Professional Consult (permissible substitution) (INDEPENDENT_AMBULATORY_CARE_PROVIDER_SITE_OTHER): Payer: PRIVATE HEALTH INSURANCE | Admitting: Cardiothoracic Surgery

## 2018-07-02 ENCOUNTER — Encounter: Payer: Self-pay | Admitting: Cardiothoracic Surgery

## 2018-07-02 VITALS — BP 130/78 | HR 60 | Resp 20 | Ht 71.0 in | Wt 197.0 lb

## 2018-07-02 DIAGNOSIS — I712 Thoracic aortic aneurysm, without rupture: Secondary | ICD-10-CM

## 2018-07-02 DIAGNOSIS — I7121 Aneurysm of the ascending aorta, without rupture: Secondary | ICD-10-CM

## 2018-07-02 NOTE — Progress Notes (Signed)
PCP is Rodolph Bong, MD Referring Provider is Rodolph Bong, MD  Chief Complaint  Patient presents with  . 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  In 2018 the patient had a scan to evaluate small benign pulmonary nodules in the aorta at that time measured 4.6.  His current scan performed October 2019 which I personally reviewed as well demonstrates no change in the ascending aorta with a diameter of 4.6 cm.  There is no mural thickening or ulceration.  There is minimal calcification.  Patient also had an echocardiogram and I also reviewed those images.  The aortic valve is normal and tricuspid.  The ascending aorta measures approximately 4.5 cm on echo.  LV function is normal.  There is no family history of thoracic abdominal aortic aneurysm disease or sudden death. Patient does not lift weights drink  alcohol or smoke.  He worked for [employer] and currently works at [employer] and walks significantly but does not follow a heart healthy diet.  He denies chest pain except for occasional sharp sticking atypical pain which resolves in about 1 minute, not related to exercise.  Lung windows of the current chest CT CT scan showed no at risk pulmonary nodules, adenopathy, or airspace disease. The patient does not measure his blood pressure routinely and he was advised to check his blood pressure at least twice a week and keep a record.  He does have a history of hypertensive retinopathy on an eye exam a year ago.  Today's blood pressure is 130/78  We discussed the risk of dissection related to an enlarged ascending thoracic aorta. At a  diameter 4.6 cm his risk of dissection is less than 1%.  Surgery is not recommended for an asymptomatic fusiform aneurysm in a patient with a tricuspid valve until diameter reaches 5.5 cm at which point the risk of dissection would be 8%.  Best therapy for his moderate ascending thoracic aneurysm is blood pressure monitoring, improved heart healthy diet, avoidance of lifting more than 50 pounds, continue his current statin, and annual surveillance CT scans.  He should also avoid  fluoro quinolones-Cipro and Levaquin- which can weaken the connective tissue matrix in the aortic wall. Past Medical History:  Diagnosis Date  . Asthma 08/07/2012   Childhood   . Depression   . Headache   . History of iron deficiency 08/07/2012  . Hypercholesterolemia   . Hypertensive retinopathy of both eyes 03/20/2017   Seen on eye exam August 2018  . Mononucleosis    at age 53  . OA (osteoarthritis) of knee    left  . Recurrent pneumonia 08/07/2012  . Tear of meniscus of left knee 08/07/2012    Past Surgical History:  Procedure Laterality Date  . COLONOSCOPY W/ BIOPSIES AND POLYPECTOMY    . EYE SURGERY    . KNEE ARTHROSCOPY     left 2014  . TONSILLECTOMY    . 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   Procedure: TOTAL KNEE ARTHROPLASTY;  Surgeon: Gean Birchwood, MD;  Location: [facility] OR;  Service: Orthopedics;  Laterality: Left;  . WISDOM TOOTH EXTRACTION      Family  History  Problem Relation Age of Onset  . Alcohol abuse Unknown        grandfather  . Heart attack Unknown        grandparents  . Cancer Mother   . Melanoma Mother   . Melanoma Father     Social History Social History   Tobacco Use  . Smoking status: Never Smoker  . Smokeless tobacco: Never Used  Substance Use Topics  . Alcohol use: No  . Drug use: No    Current Outpatient Medications  Medication Sig Dispense Refill  . Cholecalciferol (D 2000) 2000 units TABS Take 2,000 Units by mouth daily.     Marland Kitchen. escitalopram (LEXAPRO) 20 MG tablet Take 1 tablet (20 mg total) by mouth daily. 90 tablet 1  . flintstones complete (FLINTSTONES) 60 MG chewable tablet Chew 1 tablet by mouth daily. With iron    . L-Lysine 500 MG TABS Take 500 mg by mouth daily.     . Multiple Vitamins-Minerals (CENTRUM SILVER ULTRA MENS) TABS Take 1 tablet by mouth daily.     . niacin 500 MG tablet Take 500 mg by mouth daily with breakfast.    . pravastatin (PRAVACHOL) 40 MG tablet TAKE 1 TABLET BY MOUTH ONCE DAILY 90 tablet 1  . tadalafil (ADCIRCA/CIALIS) 20 MG tablet Take 0.5-1 tablets (10-20 mg total) by mouth every other day as needed for erectile dysfunction. 30 tablet 11   No current facility-administered medications for this visit.     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  Upon return.                   Review of Systems :  [ y ] = yes, [  ] = no        General :  Weight gain [   ]    Weight loss  [   ]  Fatigue [  ]  Fever [  ]  Chills  [  ]                                          HEENT    Headache [  ]  Dizziness [  ]  Blurred vision [  ] Glaucoma  [  ]                          Nosebleeds [  ] Painful or loose teeth [  ]        Cardiac :  Chest pain/ pressure [  ]  Resting SOB [  ] exertional SOB [  ]                        Orthopnea [  ]  Pedal edema  [  ]  Palpitations [  ] Syncope/presyncope [ ]                         Paroxysmal nocturnal dyspnea [  ]         Pulmonary : cough [  ]  wheezing [  ]  Hemoptysis [  ] Sputum [  ]  Snoring [  ]                              Pneumothorax [  ]  Sleep apnea [  ]        GI : Vomiting [  ]  Dysphagia [  ]  Melena  [  ]  Abdominal pain [  ] BRBPR [  ]              Heart burn [  ]  Constipation [  ] Diarrhea  [  ] Colonoscopy [   ]         GU : Hematuria [  ]  Dysuria [  ]  Nocturia [  ] UTI's [  ]        Vascular : Claudication [  ]  Rest pain [  ]  DVT [  ] Vein stripping [  ] leg ulcers [  ]                          TIA [  ] Stroke [  ]  Varicose veins [  ]        NEURO :  Headaches  [  ] Seizures [  ] Vision changes [  ] Paresthesias [  ]                                               Musculoskeletal :  Arthritis [  ] Gout  [  ]  Back pain [  ]  Joint pain [left knee replacement]        Skin :  Rash [  ]  Melanoma [  ] Sores [  ]        Heme : Bleeding problems [  ]Clotting Disorders [  ] Anemia [  ]Blood Transfusion [ ]         Endocrine : Diabetes [  ] Heat or Cold intolerance [  ] Polyuria [  ]excessive thirst [ ]         Psych : Depression [yes on Lexapro]  Anxiety [  ]  Psych hospitalizations [  ] Memory change [  ]                                                                            BP 130/78   Pulse 60   Resp 20   Ht 5\' 11"  (1.803 m)   Wt 197 lb (89.4 kg)   SpO2 98% Comment: RA  BMI 27.48 kg/m  Physical Exam      Exam    General- alert and comfortable    Neck- no JVD, no cervical adenopathy palpable, no carotid bruit   Lungs- clear without rales, wheezes   Cor- regular rate and rhythm, no murmur , gallop   Abdomen- soft, non-tender   Extremities - warm, non-tender, minimal edema   Neuro- oriented, appropriate, no focal weakness   Diagnostic Tests: CT images personally reviewed and counseled  with patient. 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  He understands at surgery is not recommended until diameter approximates 5.5 cm.  Impression: Patient needs basic heart healthy lifestyle improvement in heart healthy diet  and 30 minutes of aerobic exercise 5 times a week.  He needs to monitor his blood pressure at home and keep records.  If systolic pressure consistently exceeds 140 then low-dose losartan would be recommended.   Pravachol  is good for his aortic wall and vascular health in general.  He needs annual surveillance CTA scans which I will arrange and he will return in October 2020 for his next visit with scan.   Plan: Return in October 2020 with CTA for follow-up.   Mikey Bussing, MD [facility] [phone]

## 2018-11-27 ENCOUNTER — Other Ambulatory Visit: Payer: Self-pay | Admitting: Family Medicine

## 2018-12-02 ENCOUNTER — Telehealth: Payer: Self-pay | Admitting: Family Medicine

## 2018-12-02 DIAGNOSIS — R21 Rash and other nonspecific skin eruption: Secondary | ICD-10-CM

## 2018-12-02 NOTE — Telephone Encounter (Signed)
Pt called and requested an "Urgent referral" to [facility]- Dr. Lenis Dickinson. It is for the rash, which he states is getting worse. States the only way he can be seen ,is if referral states urgent.

## 2018-12-03 NOTE — Telephone Encounter (Signed)
appt was scheduled with Dr.Corey .

## 2018-12-03 NOTE — Telephone Encounter (Signed)
Left message for pt to read his my chart message from Dr.Corey . Pt also can schedule web visit with [place].

## 2018-12-03 NOTE — Telephone Encounter (Signed)
I went ahead and placed a referral to dermatology.  I suspect the rash is the one I looked at in February 2019.  Sometimes the insurance company requires an office visit to authorize a for referral like this.  I think is a good idea to schedule a video visit so that we can make sure we have done everything we need to do to get the referral qualified and paid for.  I have done the referral already and you should be hearing from the dermatologist office in the near future.  Please schedule video visit with me by calling [phone].  You can also send me pictures of the rash using my chart.

## 2018-12-04 ENCOUNTER — Ambulatory Visit (INDEPENDENT_AMBULATORY_CARE_PROVIDER_SITE_OTHER): Payer: PRIVATE HEALTH INSURANCE | Admitting: Family Medicine

## 2018-12-04 ENCOUNTER — Encounter: Payer: Self-pay | Admitting: Family Medicine

## 2018-12-04 ENCOUNTER — Other Ambulatory Visit: Payer: Self-pay

## 2018-12-04 VITALS — BP 143/75 | HR 54 | Temp 97.7°F | Wt 198.0 lb

## 2018-12-04 DIAGNOSIS — R21 Rash and other nonspecific skin eruption: Secondary | ICD-10-CM | POA: Diagnosis not present

## 2018-12-04 MED ORDER — CLOBETASOL PROPIONATE 0.05 % EX CREA: 1.0000 "application " | TOPICAL_CREAM | Freq: Two times a day (BID) | CUTANEOUS | 2 refills | Status: DC

## 2018-12-04 NOTE — Progress Notes (Signed)
Fred HoldingCharles Bond is a 68 y.o. male who [name] today for rash.   Patient was seen in February 2019 for rash involving his left leg.  That time the rash was thought to be lichen planus.  He was treated with clobetasol cream.  Additionally recommended terbinafine cream for the possibility that may be a dermatophyte.  In the interim he notes that the rash has continued.  He notes that he is not been using clobetasol frequently.  He notes that it is itchy and red.  He denies any pain fevers or chills.  He feels pretty well otherwise.  Requested a referral to dermatology.  His wife sees a Armed forces operational officerdermatologist and wanted to get him.  He is here today to be evaluated prior to the dermatology appointment.    ROS as above:  Exam:  BP (!) 143/75   Pulse (!) 54   Temp 97.7 F (36.5 C) (Oral)   Wt 198 lb (89.8 kg)   BMI 27.62 kg/m  Wt Readings from Last 5 Encounters:  12/04/18 198 lb (89.8 kg)  07/02/18 197 lb (89.4 kg)  05/13/18 196 lb (88.9 kg)  09/11/17 192 lb (87.1 kg)  05/01/17 191 lb 3.2 oz (86.7 kg)    Gen: Well NAD HEENT: EOMI,  MMM Lungs: Normal work of breathing. CTABL Heart: RRR no MRG Abd: NABS, Soft. Nondistended, Nontender Exts: Brisk capillary refill, warm and well perfused.  Left shin erythematous to purple macular circular rash anterior shin with some scale.  Additionally patient has some small patches of similar rash on the right lower leg as well.  Not particularly tender.  No surrounding induration or erythema.  No fluctuance.  Punch biopsy: Risks and benefits reviewed.  Consent obtained. Left lower shin skin cleaned with rubbing alcohol and cold spray applied.  Lidocaine with epinephrine injected achieving good anesthesia. 4 mm punch biopsy at the margin of the rash obtained. Antibiotic ointment and a dressing applied.  Patient tolerated  procedure well.   Assessment and Plan: 68 y.o. male with rash left lower leg.  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  I believe the reason he is not had much benefit from clobetasol as that is not using it very much at all.  Discussed that lichen planus typically needs more frequent application.  However given that he has not improved yet and is been ongoing for quite some time is reasonable to obtain a skin biopsy.  Punch biopsy obtained today.  Will await pathology results.  Refill clobetasol.  Did place referral to dermatology yesterday and hopefully hopeful that the pathology results will be back by the time he has his appointment with dermatology as that will be helpful for the dermatologist as well.  PDMP not reviewed this encounter. No orders of the defined types were placed in this encounter.  Meds ordered this encounter  Medications  . clobetasol cream (TEMOVATE) 0.05 %    Sig: Apply 1 application topically 2 (two) times daily.    Dispense:  60 g    Refill:  2     Historical information moved to improve visibility of documentation.  Past Medical History:  Diagnosis Date  . Asthma 08/07/2012   Childhood   . Depression   . Headache   . History of iron deficiency 08/07/2012  . Hypercholesterolemia   . Hypertensive retinopathy of both eyes 03/20/2017   Seen on eye exam August 2018  .  Mononucleosis    at age 49  . OA (osteoarthritis) of knee    left  . Recurrent pneumonia 08/07/2012  . Tear of meniscus of left knee 08/07/2012   Past Surgical History:  Procedure Laterality Date  . COLONOSCOPY W/ BIOPSIES AND POLYPECTOMY    . EYE SURGERY    . KNEE ARTHROSCOPY     left 2014  . TONSILLECTOMY    . 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   Procedure: TOTAL KNEE ARTHROPLASTY;  Surgeon: Gean Birchwood, MD;  Location: [facility] OR;  Service: Orthopedics;  Laterality: Left;  . WISDOM TOOTH EXTRACTION     Social History   Tobacco Use  .  Smoking status: Never Smoker  . Smokeless tobacco: Never Used  Substance Use Topics  . Alcohol use: No   family history includes Alcohol abuse in his unknown relative; Cancer in his mother; Heart attack in his unknown relative; Melanoma in his father and mother.  Medications: Current Outpatient Medications  Medication Sig Dispense Refill  . Cholecalciferol (D 2000) 2000 units TABS Take 2,000 Units by mouth daily.    Marland Kitchen escitalopram (LEXAPRO) 20 MG tablet Take 1 tablet by mouth once daily 90 tablet 1  . flintstones complete (FLINTSTONES) 60 MG chewable tablet Chew 1 tablet by mouth daily. With iron    . L-Lysine 500 MG TABS Take 500 mg by mouth daily.     . Multiple Vitamins-Minerals (CENTRUM SILVER ULTRA MENS) TABS Take 1 tablet by mouth daily.     . niacin 500 MG tablet Take 500 mg by mouth daily with breakfast.    . pravastatin (PRAVACHOL) 40 MG tablet TAKE 1 TABLET BY MOUTH ONCE DAILY 90 tablet 1  . tadalafil (ADCIRCA/CIALIS) 20 MG tablet Take 0.5-1 tablets (10-20 mg total) by mouth every other day as needed for erectile dysfunction. 30 tablet 11  . clobetasol cream (TEMOVATE) 0.05 % Apply 1 application topically 2 (two) times daily. 60 g 2   No current facility-administered medications for this visit.    No Known Allergies   Discussed warning signs or symptoms. Please see discharge instructions. Patient expresses understanding.

## 2018-12-04 NOTE — Patient Instructions (Addendum)
Thank you for coming in today.  Use clobetasol cream.  I will get skin pathology results to you ASAP and to your dermatologist.   Use the cream twice daily for aat least 3 weeks to see an effect.   Leave a part untreated as the dermatologist can see that.    Lichen Planus Lichen planus is a skin problem that causes redness, itching, small bumps, and sores. It can affect the skin in any area of the body. Some common areas affected include:  Arms, wrists, legs, or ankles.  Chest, back, or abdomen.  Genital areas such as the vulva and vagina.  Gums and inside of the mouth.  Scalp.  Fingernails or toenails. Treatment can help control the symptoms of this condition. The condition can last for a long time. It can take 6-18 months or longer for it to go away. What are the causes? The exact cause of this condition is not known. The condition is not passed from one person to another (not contagious). It may be related to an allergy, a medicine, or an autoimmune response. An autoimmune response occurs when the body's defense system (immune system) mistakenly attacks healthy tissues. What increases the risk? The following factors may make you more likely to develop this condition:  Being older than 66 years of age.  Taking certain medicines.  Having been exposed to certain dyes or chemicals.  Having hepatitis C.  Being a woman. What are the signs or symptoms? Symptoms of this condition may include:  Itching, which can be severe.  Small reddish or purple bumps on the skin. These may have flat tops and may be round or irregular shaped.  Redness or white patches on the gums or tongue.  Redness, soreness, or a burning feeling in the genital area. This may lead to pain or bleeding during sex.  Changes in the fingernails or toenails. The nails may become thin or rough. They may have ridges in them.  Redness or irritation of the eyes. This is rare. How is this diagnosed? This  condition may be diagnosed based on:  A physical exam. The health care provider will examine your affected skin and check for changes inside your mouth.  Removal of a tissue sample (biopsy sample) to be looked at under a microscope. How is this treated? Treatment for this condition may depend on the severity of symptoms. In some cases, no treatment is needed. If treatment is needed to control symptoms, it may include:  Creams or ointments (topical steroids) to help control itching and irritation.  Medicine to be taken by mouth.  Medicine to be taken by injection.  A treatment in which your skin is exposed to ultraviolet light (phototherapy).  Lozenges that you suck on to help treat sores in the mouth. Follow these instructions at home:   Take or use over-the-counter and prescription medicines only as told by your health care provider.  Use creams or ointments as told by your health care provider.  Do not scratch the affected areas of skin.  If you are a woman, be sure to keep the vaginal area as clean and dry as possible.  If you have sores in your mouth, avoid spicy and acidic foods as well as alcohol and tobacco.  Keep all follow-up visits as told by your health care provider. This is important. Contact a health care provider if:  You have increasing redness, swelling, or pain in the affected area.  You have fluid, blood, or pus coming from  the affected area.  Your eyes become irritated. Summary  Lichen planus is a skin problem that causes redness, itching, small bumps, and sores. It can affect the skin in any area of the body.  Do not scratch the affected areas of skin. Keep the affected area of skin clean.  Take or use over-the-counter and prescription medicines only as told by your health care provider.  Contact a health care provider if you have drainage from the affected area or have increasing redness, swelling, or pain in the area.  Keep all follow-up visits  as told by your health care provider. This is important. This information is not intended to replace advice given to you by your health care provider. Make sure you discuss any questions you have with your health care provider. Document Released: 12/07/2010 Document Revised: 11/27/2017 Document Reviewed: 11/27/2017 Elsevier Interactive Patient Education  2019 [employer].

## 2018-12-15 ENCOUNTER — Other Ambulatory Visit: Payer: Self-pay | Admitting: Family Medicine

## 2019-04-08 ENCOUNTER — Other Ambulatory Visit: Payer: Self-pay | Admitting: Cardiothoracic Surgery

## 2019-04-08 DIAGNOSIS — I712 Thoracic aortic aneurysm, without rupture, unspecified: Secondary | ICD-10-CM

## 2019-05-13 ENCOUNTER — Other Ambulatory Visit: Payer: Self-pay

## 2019-05-13 ENCOUNTER — Ambulatory Visit (INDEPENDENT_AMBULATORY_CARE_PROVIDER_SITE_OTHER): Payer: PRIVATE HEALTH INSURANCE | Admitting: Family Medicine

## 2019-05-13 ENCOUNTER — Encounter: Payer: Self-pay | Admitting: Family Medicine

## 2019-05-13 VITALS — BP 142/74 | HR 54 | Temp 97.6°F | Wt 199.0 lb

## 2019-05-13 DIAGNOSIS — F320 Major depressive disorder, single episode, mild: Secondary | ICD-10-CM

## 2019-05-13 DIAGNOSIS — N401 Enlarged prostate with lower urinary tract symptoms: Secondary | ICD-10-CM

## 2019-05-13 DIAGNOSIS — I712 Thoracic aortic aneurysm, without rupture, unspecified: Secondary | ICD-10-CM

## 2019-05-13 DIAGNOSIS — R351 Nocturia: Secondary | ICD-10-CM

## 2019-05-13 DIAGNOSIS — E785 Hyperlipidemia, unspecified: Secondary | ICD-10-CM

## 2019-05-13 DIAGNOSIS — D692 Other nonthrombocytopenic purpura: Secondary | ICD-10-CM | POA: Diagnosis not present

## 2019-05-13 DIAGNOSIS — H35033 Hypertensive retinopathy, bilateral: Secondary | ICD-10-CM

## 2019-05-13 MED ORDER — ESCITALOPRAM OXALATE 20 MG PO TABS: 20.0000 mg | ORAL_TABLET | Freq: Every day | ORAL | 3 refills | Status: DC

## 2019-05-13 MED ORDER — TADALAFIL 20 MG PO TABS: 10.0000 mg | ORAL_TABLET | ORAL | 11 refills | Status: DC | PRN

## 2019-05-13 MED ORDER — TAMSULOSIN HCL 0.4 MG PO CAPS: 0.4000 mg | ORAL_CAPSULE | Freq: Every day | ORAL | 3 refills | Status: DC

## 2019-05-13 MED ORDER — PRAVASTATIN SODIUM 40 MG PO TABS: 40.0000 mg | ORAL_TABLET | Freq: Every day | ORAL | 3 refills | Status: DC

## 2019-05-13 NOTE — Progress Notes (Signed)
Fred Bond is a 68 y.o. male who presents to [name] today for nocturia.  Patient has had an ongoing history of having to get up at night to urinate.  He notes it is usually 1-2 times at night but over the last 2 to 3 months it has been a little more frequent usually 2 sometimes 3 times at night.  He also notes a feeling though he does not empty his bladder fully at times.  He denies any pain or urinary incontinence fevers chills nausea vomiting or diarrhea.  AUA symptom score 8/35.  Quality-of-life score 3/6.  See scanned document.  ROS as above:  Exam:  BP (!) 142/74   Pulse (!) 54   Temp 97.6 F (36.4 C) (Oral)   Wt 199 lb (90.3 kg)   BMI 27.75 kg/m  Wt Readings from Last 5 Encounters:  05/13/19 199 lb (90.3 kg)  12/04/18 198 lb (89.8 kg)  07/02/18 197 lb (89.4 kg)  05/13/18 196 lb (88.9 kg)  09/11/17 192 lb (87.1 kg)    Gen: Well NAD HEENT: EOMI,  MMM Lungs: Normal work of breathing. CTABL Heart: RRR no MRG Abd: NABS, Soft. Nondistended, Nontender Exts: Brisk capillary refill, warm and well perfused.  Rectal exam: Normal-appearing anus.  Normal rectal tone.  Prostate slightly enlarged nontender no nodules.    Assessment and Plan: 68 y.o. male with lower urinary tract symptoms likely related to BPH. Patient does have a abnormal AUA symptom score in the moderate range. Plan for metabolic work-up including PSA.  Empiric treatment with Flomax.  Recheck if not improving.  Additionally patient will be due for his annual labs in about 1 week.  We will go ahead and order CBC metabolic and lipid panel to be done fasting in about a week.  Chronic medications refilled.  If all is well recheck in 6 months with new PCP.  PDMP not reviewed this encounter. Orders Placed This Encounter  Procedures  . CBC  . COMPLETE METABOLIC PANEL WITH GFR  . Lipid  Panel w/reflex Direct LDL  . PSA   Meds ordered this encounter  Medications  . escitalopram (LEXAPRO) 20 MG tablet    Sig: Take 1 tablet (20 mg total) by mouth daily.    Dispense:  90 tablet    Refill:  3  . pravastatin (PRAVACHOL) 40 MG tablet    Sig: Take 1 tablet (40 mg total) by mouth daily.    Dispense:  90 tablet    Refill:  3  . tadalafil (CIALIS) 20 MG tablet    Sig: Take 0.5-1 tablets (10-20 mg total) by mouth every other day as needed for erectile dysfunction.    Dispense:  30 tablet    Refill:  11  . tamsulosin (FLOMAX) 0.4 MG CAPS capsule    Sig: Take 1 capsule (0.4 mg total) by mouth daily after breakfast.    Dispense:  90 capsule    Refill:  3     Historical information moved to improve visibility of documentation.  Past Medical History:  Diagnosis Date  . Asthma 08/07/2012   Childhood   . Depression   . Headache   . History of iron deficiency 08/07/2012  . Hypercholesterolemia   . Hypertensive retinopathy of both eyes 03/20/2017   Seen on eye exam August 2018  . Mononucleosis    at age 81  . OA (osteoarthritis) of knee    left  .  Recurrent pneumonia 08/07/2012  . Tear of meniscus of left knee 08/07/2012   Past Surgical History:  Procedure Laterality Date  . COLONOSCOPY W/ BIOPSIES AND POLYPECTOMY    . EYE SURGERY    . KNEE ARTHROSCOPY     left 2014  . TONSILLECTOMY    . 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   Procedure: TOTAL KNEE ARTHROPLASTY;  Surgeon: Frederik Pear, MD;  Location: [facility];  Service: Orthopedics;  Laterality: Left;  . WISDOM TOOTH EXTRACTION     Social History   Tobacco Use  . Smoking status: Never Smoker  . Smokeless tobacco: Never Used  Substance Use Topics  . Alcohol use: No   family history includes Alcohol abuse in his unknown relative; Cancer in his mother; Heart attack in his unknown relative; Melanoma in his father and mother.  Medications: Current Outpatient Medications   Medication Sig Dispense Refill  . Cholecalciferol (D 2000) 2000 units TABS Take 2,000 Units by mouth daily.    . clobetasol cream (TEMOVATE) 5.63 % Apply 1 application topically 2 (two) times daily. 60 g 2  . escitalopram (LEXAPRO) 20 MG tablet Take 1 tablet (20 mg total) by mouth daily. 90 tablet 3  . flintstones complete (FLINTSTONES) 60 MG chewable tablet Chew 1 tablet by mouth daily. With iron    . L-Lysine 500 MG TABS Take 500 mg by mouth daily.     . Multiple Vitamins-Minerals (CENTRUM SILVER ULTRA MENS) TABS Take 1 tablet by mouth daily.     . niacin 500 MG tablet Take 500 mg by mouth daily with breakfast.    . pravastatin (PRAVACHOL) 40 MG tablet Take 1 tablet (40 mg total) by mouth daily. 90 tablet 3  . tadalafil (CIALIS) 20 MG tablet Take 0.5-1 tablets (10-20 mg total) by mouth every other day as needed for erectile dysfunction. 30 tablet 11  . tamsulosin (FLOMAX) 0.4 MG CAPS capsule Take 1 capsule (0.4 mg total) by mouth daily after breakfast. 90 capsule 3   No current facility-administered medications for this visit.    No Known Allergies   Discussed warning signs or symptoms. Please see discharge instructions. Patient expresses understanding.

## 2019-05-13 NOTE — Patient Instructions (Addendum)
Thank you for coming in today. Get labs after Oct 21st fasting  Start Flomax.  Let me know if not better.   Tamsulosin capsules What is this medicine? TAMSULOSIN (tam SOO loe sin) is an alpha blocker. It is used to treat the signs and symptoms of an enlarged prostate in men. This condition is also called benign prostatic hyperplasia (BPH). This medicine may be used for other purposes; ask your health care provider or pharmacist if you have questions. COMMON BRAND NAME(S): Flomax What should I tell my health care provider before I take this medicine? They need to know if you have any of the following conditions:  advanced kidney disease  advanced liver disease  low blood pressure  prostate cancer  an unusual or allergic reaction to tamsulosin, sulfa drugs, other medicines, foods, dyes, or preservatives  pregnant or trying to get pregnant  breast-feeding How should I use this medicine? Take this medicine by mouth about 30 minutes after the same meal every day. Follow the directions on the prescription label. Swallow the capsules whole with a glass of water. Do not crush, chew, or open capsules. Do not take your medicine more often than directed. Do not stop taking your medicine unless your doctor tells you to. Talk to your pediatrician regarding the use of this medicine in children. Special care may be needed. Overdosage: If you think you have taken too much of this medicine contact a poison control center or emergency room at once. NOTE: This medicine is only for you. Do not share this medicine with others. What if I miss a dose? If you miss a dose, take it as soon as you can. If it is almost time for your next dose, take only that dose. Do not take double or extra doses. If you stop taking your medicine for several days or more, ask your doctor or health care professional what dose you should start back on. What may interact with this  medicine?  cimetidine  fluoxetine  ketoconazole  medicines for erectile disfunction like sildenafil, tadalafil, vardenafil  medicines for high blood pressure  other alpha-blockers like alfuzosin, doxazosin, phentolamine, phenoxybenzamine, prazosin, terazosin  warfarin This list may not describe all possible interactions. Give your health care provider a list of all the medicines, herbs, non-prescription drugs, or dietary supplements you use. Also tell them if you smoke, drink alcohol, or use illegal drugs. Some items may interact with your medicine. What should I watch for while using this medicine? Visit your doctor or health care professional for regular check ups. You will need lab work done before you start this medicine and regularly while you are taking it. Check your blood pressure as directed. Ask your health care professional what your blood pressure should be, and when you should contact him or her. This medicine may make you feel dizzy or lightheaded. This is more likely to happen after the first dose, after an increase in dose, or during hot weather or exercise. Drinking alcohol and taking some medicines can make this worse. Do not drive, use machinery, or do anything that needs mental alertness until you know how this medicine affects you. Do not sit or stand up quickly. If you begin to feel dizzy, sit down until you feel better. These effects can decrease once your body adjusts to the medicine. Contact your doctor or health care professional right away if you have an erection that lasts longer than 4 hours or if it becomes painful. This may be a sign  of a serious problem and must be treated right away to prevent permanent damage. If you are thinking of having cataract surgery, tell your eye surgeon that you have taken this medicine. What side effects may I notice from receiving this medicine? Side effects that you should report to your doctor or health care professional as soon as  possible:  allergic reactions like skin rash or itching, hives, swelling of the lips, mouth, tongue, or throat  breathing problems  change in vision  feeling faint or lightheaded  irregular heartbeat  prolonged or painful erection  weakness Side effects that usually do not require medical attention (report to your doctor or health care professional if they continue or are bothersome):  back pain  change in sex drive or performance  constipation, nausea or vomiting  cough  drowsy  runny or stuffy nose  trouble sleeping This list may not describe all possible side effects. Call your doctor for medical advice about side effects. You may report side effects to FDA at 1-800-FDA-1088. Where should I keep my medicine? Keep out of the reach of children. Store at room temperature between 15 and 30 degrees C (59 and 86 degrees F). Throw away any unused medicine after the expiration date. NOTE: This sheet is a summary. It may not cover all possible information. If you have questions about this medicine, talk to your doctor, pharmacist, or health care provider.  2020 Elsevier/Gold Standard (2017-12-19 12:54:06)   I will be moving to full time Sports Medicine in [place] starting on November 2nd  You will still be able to see me for your Sports Medicine or Orthopedic needs at [facility] in [place]. I will still be part of [facility].    If you want to stay locally for your Sports Medicine issues Dr. Benjamin Stain here in [place] will be happy to see you.  Additionally Dr. Clare Gandy at [facility] will be happy to see you for sports medicine issues more locally.   For your primary care needs you are welcome to establish care with Dr. Sunnie Nielsen.  Dr Everrett Coombe (Starting in February) will be starting in the new year and a new NP Joy (Starting in December).  We are working quickly to hire more physicians to cover the primary care needs  however if you cannot get an appointment with Dr. Lyn Hollingshead in a timely manner [facility] has locations and openings for primary care services nearby.   [facility] at [facility] [address] . [employer] , [address]: [phone] . Behavioral Medicine: [phone] . Fax: [phone]  [facility] [employer] [address] . [place], [place]: [phone] . Behavioral Medicine: [phone] . Fax: [phone] . Hours (M-F): 7am - Armed forces operational officer At [facility]. [address] . [place], [place]: [phone] . Behavioral Medicine: [phone] . Fax: [phone] . Hours (M-F): 8am - Curator at [employer] . [facility] [place] [place] Phone: [phone] . Behavioral Medicine: [phone] . Fax: [phone]

## 2019-05-27 ENCOUNTER — Other Ambulatory Visit: Payer: PRIVATE HEALTH INSURANCE

## 2019-05-27 ENCOUNTER — Ambulatory Visit: Payer: PRIVATE HEALTH INSURANCE | Admitting: Cardiothoracic Surgery

## 2019-05-29 ENCOUNTER — Other Ambulatory Visit: Payer: Self-pay | Admitting: Cardiothoracic Surgery

## 2019-06-02 ENCOUNTER — Other Ambulatory Visit: Payer: Self-pay | Admitting: Cardiothoracic Surgery

## 2019-06-02 LAB — LIPID PANEL W/REFLEX DIRECT LDL
Cholesterol: 161 mg/dL (ref <200)
HDL: 69 mg/dL (ref >=40)
LDL Cholesterol (Calc): 78 mg/dL (calc)
Non-HDL Cholesterol (Calc): 92 mg/dL (calc) (ref <130)
Total CHOL/HDL Ratio: 2.3 (calc) (ref <5.0)
Triglycerides: 60 mg/dL (ref <150)

## 2019-06-02 LAB — COMPLETE METABOLIC PANEL WITH GFR
AG Ratio: 1.5 (calc) (ref 1.0–2.5)
ALT: 26 U/L (ref 9–46)
AST: 31 U/L (ref 10–35)
Albumin: 4.1 g/dL (ref 3.6–5.1)
Alkaline phosphatase (APISO): 65 U/L (ref 35–144)
BUN: 11 mg/dL (ref 7–25)
CO2: 29 mmol/L (ref 20–32)
Calcium: 9.2 mg/dL (ref 8.6–10.3)
Chloride: 102 mmol/L (ref 98–110)
Creat: 0.85 mg/dL (ref 0.70–1.25)
GFR, Est African American: 105 mL/min/{1.73_m2} (ref >=60)
GFR, Est Non African American: 91 mL/min/{1.73_m2} (ref >=60)
Globulin: 2.7 g/dL (calc) (ref 1.9–3.7)
Glucose, Bld: 94 mg/dL (ref 65–99)
Potassium: 4.8 mmol/L (ref 3.5–5.3)
Sodium: 142 mmol/L (ref 135–146)
Total Bilirubin: 0.4 mg/dL (ref 0.2–1.2)
Total Protein: 6.8 g/dL (ref 6.1–8.1)

## 2019-06-02 LAB — CBC
HCT: 37.5 % — ABNORMAL LOW (ref 38.5–50.0)
Hemoglobin: 13 g/dL — ABNORMAL LOW (ref 13.2–17.1)
MCH: 33.2 pg — ABNORMAL HIGH (ref 27.0–33.0)
MCHC: 34.7 g/dL (ref 32.0–36.0)
MCV: 95.9 fL (ref 80.0–100.0)
MPV: 9.2 fL (ref 7.5–12.5)
Platelets: 243 10*3/uL (ref 140–400)
RBC: 3.91 10*6/uL — ABNORMAL LOW (ref 4.20–5.80)
RDW: 12.9 % (ref 11.0–15.0)
WBC: 5.2 10*3/uL (ref 3.8–10.8)

## 2019-06-02 LAB — PSA: PSA: 1.4 ng/mL (ref <=4.0)

## 2019-06-03 ENCOUNTER — Other Ambulatory Visit: Payer: Self-pay | Admitting: Cardiothoracic Surgery

## 2019-06-03 ENCOUNTER — Ambulatory Visit
Admission: RE | Admit: 2019-06-03 | Discharge: 2019-06-03 | Disposition: A | Discharge status: Home / Self Care | Payer: PRIVATE HEALTH INSURANCE | Source: Ambulatory Visit | Attending: Cardiothoracic Surgery | Admitting: Cardiothoracic Surgery

## 2019-06-03 ENCOUNTER — Encounter: Payer: Self-pay | Admitting: Cardiothoracic Surgery

## 2019-06-03 ENCOUNTER — Other Ambulatory Visit: Payer: Self-pay

## 2019-06-03 ENCOUNTER — Ambulatory Visit (INDEPENDENT_AMBULATORY_CARE_PROVIDER_SITE_OTHER): Payer: PRIVATE HEALTH INSURANCE | Admitting: Cardiothoracic Surgery

## 2019-06-03 VITALS — BP 140/70 | HR 64 | Temp 97.8°F | Resp 20 | Ht 71.0 in | Wt 199.0 lb

## 2019-06-03 DIAGNOSIS — I7121 Aneurysm of the ascending aorta, without rupture: Secondary | ICD-10-CM

## 2019-06-03 DIAGNOSIS — I712 Thoracic aortic aneurysm, without rupture, unspecified: Secondary | ICD-10-CM

## 2019-06-03 DIAGNOSIS — R079 Chest pain, unspecified: Secondary | ICD-10-CM

## 2019-06-03 MED ORDER — IOPAMIDOL (ISOVUE-370) INJECTION 76%
75.0000 mL | Freq: Once | INTRAVENOUS | Status: AC | PRN
  Administered 2019-06-03: 75 mL via INTRAVENOUS

## 2019-06-03 NOTE — Progress Notes (Signed)
PCP is Rodolph Bongorey, Evan S, MD Referring Provider is [place], Markail Diekman M, MD  Chief Complaint  Patient presents with  . Thoracic Aortic Aneurysm    1 year f/u with CTA Chest    XBM:[id number]:followup for asymptomatic 4.3 cm ascending thoracic aneurysm followed since 2017. CTA today reviewed showing no change. BP has been adequately controlled. Patient has had several episodes of chest pain Which are suspect for angina- will assess with Myoview Risk factors include family hx and PAD Past Medical History:  Diagnosis Date  . Asthma 08/07/2012   Childhood   . Depression   . Headache   . History of iron deficiency 08/07/2012  . Hypercholesterolemia   . Hypertensive retinopathy of both eyes 03/20/2017   Seen on eye exam August 2018  . Mononucleosis    at age 68  . OA (osteoarthritis) of knee    left  . Recurrent pneumonia 08/07/2012  . Tear of meniscus of left knee 08/07/2012    Past Surgical History:  Procedure Laterality Date  . COLONOSCOPY W/ BIOPSIES AND POLYPECTOMY    . EYE SURGERY    . KNEE ARTHROSCOPY     left 2014  . TONSILLECTOMY    . 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   Procedure: TOTAL KNEE ARTHROPLASTY;  Surgeon: Gean Birchwoodowan, Frank, MD;  Location: [facility] OR;  Service: Orthopedics;  Laterality: Left;  . WISDOM TOOTH EXTRACTION      Family History  Problem Relation Age of Onset  . Alcohol abuse Unknown        grandfather  . Heart attack Unknown        grandparents  . Cancer Mother   . Melanoma Mother   . Melanoma Father     Social History Social History   Tobacco Use  . Smoking status: Never Smoker  . Smokeless tobacco: Never Used  Substance Use Topics  . Alcohol use: No  . Drug use: No    Current Outpatient Medications  Medication Sig Dispense Refill  . Cholecalciferol (D 2000) 2000 units TABS Take 2,000 Units by mouth daily.    . clobetasol cream (TEMOVATE) 0.05 % Apply 1 application topically 2 (two) times daily. 60 g 2  .  escitalopram (LEXAPRO) 20 MG tablet Take 1 tablet (20 mg total) by mouth daily. 90 tablet 3  . flintstones complete (FLINTSTONES) 60 MG chewable tablet Chew 1 tablet by mouth daily. With iron    . L-Lysine 500 MG TABS Take 500 mg by mouth daily.     . Multiple Vitamins-Minerals (CENTRUM SILVER ULTRA MENS) TABS Take 1 tablet by mouth daily.     . niacin 500 MG tablet Take 500 mg by mouth daily with breakfast.    . pravastatin (PRAVACHOL) 40 MG tablet Take 1 tablet (40 mg total) by mouth daily. 90 tablet 3  . tadalafil (CIALIS) 20 MG tablet Take 0.5-1 tablets (10-20 mg total) by mouth every other day as needed for erectile dysfunction. 30 tablet 11  . tamsulosin (FLOMAX) 0.4 MG CAPS capsule Take 1 capsule (0.4 mg total) by mouth daily after breakfast. (Patient not taking: Reported on 06/03/2019) 90 capsule 3   No current facility-administered medications for this visit.     No Known Allergies  Review of Systems                    Review of Systems :  [ y ] = yes, [  ] = no  General :  Weight gain [   ]    Weight loss  [   ]  Fatigue [  ]  Fever [  ]  Chills  [  ]                                          HEENT    Headache [  ]  Dizziness [  ]  Blurred vision [  ] Glaucoma  [  ]                          Nosebleeds [  ] Painful or loose teeth [  ]        Cardiac :  Chest pain/ pressure [place]Etienne  ]  Resting SOB [  ] exertional SOB [  ]                        Orthopnea [  ]  Pedal edema  [  ]  Palpitations [  ] Syncope/presyncope [ ]                         Paroxysmal nocturnal dyspnea [  ]         Pulmonary : cough [  ]  wheezing [  ]  Hemoptysis [  ] Sputum [  ] Snoring [  ]                              Pneumothorax [  ]  Sleep apnea [  ]        GI : Vomiting [  ]  Dysphagia [  ]  Melena  [  ]  Abdominal pain [  ] BRBPR [  ]              Heart burn [  ]  Constipation [  ] Diarrhea  [  ] Colonoscopy [   ]        GU : Hematuria [  ]  Dysuria [  ]  Nocturia [  ] UTI's [  ]         Vascular : Claudication [  ]  Rest pain [  ]  DVT [  ] Vein stripping [  ] leg ulcers [  ]                          TIA [  ] Stroke [  ]  Varicose veins [  ]        NEURO :  Headaches  [  ] Seizures [  ] Vision changes [  ] Paresthesias [  ]                                               Musculoskeletal :  Arthritis [  ] Gout  [  ]  Back pain [  ]  Joint pain [  ]        Skin :  Rash [  ]  Melanoma [  ] Sores [  ]  Heme : Bleeding problems [  ]Clotting Disorders [  ] Anemia [  ]Blood Transfusion [ ]         Endocrine : Diabetes [  ] Heat or Cold intolerance [  ] Polyuria [  ]excessive thirst [ ]         Psych : Depression [  ]  Anxiety [  ]  Psych hospitalizations [  ] Memory change [  ]                                                                            BP 140/70   Pulse 64   Temp 97.8 F (36.6 C) (Skin)   Resp 20   Ht 5\' 11"  (1.803 m)   Wt 199 lb (90.3 kg)   SpO2 95% Comment: RA  BMI 27.75 kg/m  Physical Exam      Exam    General- alert and comfortable    Neck- no JVD, no cervical adenopathy palpable, no carotid bruit   Lungs- clear without rales, wheezes   Cor- regular rate and rhythm, no murmur , gallop   Abdomen- soft, non-tender   Extremities - warm, non-tender, minimal edema   Neuro- oriented, appropriate, no focal weakness   Diagnostic Tests: CTA image personally reviewed  Impression: Stable 4.3 cm TAA- low risk for tear, doesnot meet criteria foer surgery  Plan:Myoview to eval chest pain CTA in 1 year   Len Childs, MD [facility] [phone]

## 2019-12-14 ENCOUNTER — Encounter: Payer: Self-pay | Admitting: Family Medicine

## 2019-12-14 ENCOUNTER — Ambulatory Visit (INDEPENDENT_AMBULATORY_CARE_PROVIDER_SITE_OTHER): Payer: Commercial Managed Care - PPO | Admitting: Family Medicine

## 2019-12-14 DIAGNOSIS — M7041 Prepatellar bursitis, right knee: Secondary | ICD-10-CM | POA: Insufficient documentation

## 2019-12-14 MED ORDER — DICLOFENAC SODIUM 1 % EX GEL: 4.0000 g | Freq: Four times a day (QID) | CUTANEOUS | 1 refills | Status: DC

## 2019-12-14 NOTE — Assessment & Plan Note (Signed)
History of repetitive pressure to knee from kneeling while at work.  Recommend icing 3-4x per day, use of compression sleeve and knee pad to protect at work.  Start topical diclofenac 1% qid.  Follow up if not resolving.  Discussed red flags including increasing pain, warmth, redness or fever.

## 2019-12-14 NOTE — Patient Instructions (Signed)
Great to meet you today!  Diclofenac Gel 1%-Apply to knee 4 times per day Ice area 3-4x per day Pick up a compression knee sleeve and wear, especially if working.  Use knee pain to provide cushion if kneeling for long periods of time.      Prepatellar Bursitis  Prepatellar bursitis is inflammation of the prepatellar bursa, which is a fluid-filled sac that cushions the kneecap (patella). Prepatellar bursitis happens when fluid builds up in this sac and causes it to swell. The condition causes knee pain. What are the causes? This condition may be caused by:  Constant pressure on the knees from kneeling.  A hit to the knee.  Falling on the knee.  Infection from bacteria.  Moving the knee often in a forceful way. What increases the risk? You are more likely to develop this condition if:  You play sports that have a high risk of falling on the knee or being hit on the knee. These include football, wrestling, basketball, or soccer.  You do work in which you kneel for long periods of time, such as roofing, plumbing, or gardening.  You have another inflammatory condition, such as gout or rheumatoid arthritis. What are the signs or symptoms? The most common symptom of this condition is knee pain that gets better with rest. Other symptoms include:  Swelling on the front of the kneecap.  Warmth in the knee.  Tenderness with activity.  Redness in the knee.  Inability to bend the knee or to kneel. How is this diagnosed? This condition is diagnosed based on:  A physical exam. Your health care provider will compare your knees and check for tenderness and pain while moving your knee.  Your medical history.  Tests to check for infection. These may include blood tests and tests on the fluid in the bursa.  Imaging tests, such as X-ray, MRI, or ultrasound, to check for damage in the patella, or fluid buildup and swelling in the bursa. How is this treated? This condition may be  treated by:  Resting the knee.  Putting ice on the knee.  Taking medicines, such as: ? NSAIDs. These can help to reduce pain and swelling. ? Antibiotics. These may be needed if you have an infection. ? Steroids. These are used to reduce swelling and inflammation, and may be prescribed if other treatments are not helping.  Raising (elevating) the knee while resting.  Doing exercises to help you maintain movement (physical therapy). These may be recommended after pain and swelling improve.  Having a procedure to remove fluid from the bursa. This may be done if other treatments are not helping.  Having surgery to remove the bursa. This may be done if you have a severe infection or if the condition keeps coming back after treatment. Follow these instructions at home: Medicines  Take over-the-counter and prescription medicines only as told by your health care provider.  If you were prescribed an antibiotic medicine, take it as told by your health care provider. Do not stop taking the antibiotic even if you start to feel better. Managing pain, stiffness, and swelling   If directed, put ice on the injured area. ? Put ice in a plastic bag. ? Place a towel between your skin and the bag. ? Leave the ice on for 20 minutes, 2-3 times a day.  Elevate the injured area above the level of your heart while you are sitting or lying down. Activity  Do not use the injured limb to support your  body weight until your health care provider says that you can.  Rest your knee.  Avoid activities that cause knee pain.  Return to your normal activities as told by your health care provider. Ask your health care provider what activities are safe for you.  Do exercises as told by your health care provider. General instructions  Ask your health care provider when it is safe for you to drive.  Do not use any products that contain nicotine or tobacco, such as cigarettes, e-cigarettes, and chewing  tobacco. These can delay healing. If you need help quitting, ask your health care provider.  Keep all follow-up visits as told by your health care provider. This is important. How is this prevented?  Warm up and stretch before being active.  Cool down and stretch after being active.  Give your body time to rest between periods of activity.  Maintain physical fitness, including strength and flexibility.  Be safe and responsible while being active. This will help you to avoid falls.  Wear knee pads if you have to kneel for a long period of time. Contact a health care provider if:  Your symptoms do not improve or get worse.  Your symptoms keep coming back after treatment.  You develop a fever and have warmth, redness, or swelling over your knee. Summary  Prepatellar bursitis is inflammation of the prepatellar bursa, which is a fluid-filled sac that cushions the kneecap (patella).  This condition may be caused by injury or constant pressure on the knee. It may also be caused by an infection from bacteria.  Symptoms of this condition include pain, swelling, warmth, and tenderness in the knee.  Follow instructions from your health care provider about taking medicines, resting, and doing activities.  Contact your health care provider if your symptoms do not improve, get worse, or keep coming back after treatment. This information is not intended to replace advice given to you by your health care provider. Make sure you discuss any questions you have with your health care provider. Document Revised: 11/07/2018 Document Reviewed: 09/25/2018 Elsevier Patient Education  [place].

## 2019-12-14 NOTE — Progress Notes (Signed)
Fred Bond - 69 y.o. male MRN [id number]  Date of birth: 06-Dec-1950  Subjective Chief Complaint  Patient presents with  . Knee Pain    HPI Fred Bond is a 69 y.o. male with history of depression, mild asthma, HLD, OA and thoracic aneurysm here today with complaint of knee swelling.    Reports knee swelling started a few days ago.  Denies significant pain with this. Actually states that it feels more comfortable because if provides some cushion around his knee.   Works part time at [employer] and is climbing ladders and kneeling often.  Previous xrays with advance medial compartment degenerative changes.  He has not tried anything for treatment so far.   ROS:  A comprehensive ROS was completed and negative except as noted per HPI  No Known Allergies  Past Medical History:  Diagnosis Date  . Asthma 08/07/2012   Childhood   . Depression   . Headache   . History of iron deficiency 08/07/2012  . Hypercholesterolemia   . Hypertensive retinopathy of both eyes 03/20/2017   Seen on eye exam August 2018  . Mononucleosis    at age 49  . OA (osteoarthritis) of knee    left  . Recurrent pneumonia 08/07/2012  . Tear of meniscus of left knee 08/07/2012    Past Surgical History:  Procedure Laterality Date  . COLONOSCOPY W/ BIOPSIES AND POLYPECTOMY    . EYE SURGERY    . KNEE ARTHROSCOPY     left 2014  . TONSILLECTOMY    . 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   Procedure: TOTAL KNEE ARTHROPLASTY;  Surgeon: Gean Birchwood, MD;  Location: [facility] OR;  Service: Orthopedics;  Laterality: Left;  . WISDOM TOOTH EXTRACTION      Social History   Socioeconomic History  . Marital status: Married    Spouse name: Not on file  . Number of children: Not on file  . Years of education: Not on file  . Highest education level: Not on file  Occupational History  . Not on file  Tobacco Use  . Smoking status: Never Smoker  . Smokeless  tobacco: Never Used  Substance and Sexual Activity  . Alcohol use: No  . Drug use: No  . Sexual activity: Not on file  Other Topics Concern  . Not on file  Social History Narrative  . Not on file   Social Determinants of Health   Financial Resource Strain:   . Difficulty of Paying Living Expenses:   Food Insecurity:   . Worried About Programme researcher, broadcasting/film/video in the Last Year:   . Barista in the Last Year:   Transportation Needs:   . Freight forwarder (Medical):   Marland Kitchen Lack of Transportation (Non-Medical):   Physical Activity:   . Days of Exercise per Week:   . Minutes of Exercise per Session:   Stress:   . Feeling of Stress :   Social Connections:   . Frequency of Communication with Friends and Family:   . Frequency of Social Gatherings with Friends and Family:   . Attends Religious Services:   . Active Member of Clubs or Organizations:   . Attends Banker Meetings:   Marland Kitchen Marital Status:     Family History  Problem Relation Age of Onset  . Alcohol abuse Unknown        grandfather  . Heart attack Unknown  grandparents  . Cancer Mother   . Melanoma Mother   . Melanoma Father     Health Maintenance  Topic Date Due  . PNA vac Low Risk Adult (2 of 2 - PPSV23) 05/14/2019  . INFLUENZA VACCINE  02/28/2020  . TETANUS/TDAP  03/30/2024  . COLONOSCOPY  06/29/2025  . COVID-19 Vaccine  Completed  . Hepatitis C Screening  Completed     ----------------------------------------------------------------------------------------------------------------------------------------------------------------------------------------------------------------- Physical Exam BP 131/71 (BP Location: Left Arm, Patient Position: Sitting, Cuff Size: Normal)   Pulse 68   Wt 199 lb 14.4 oz (90.7 kg)   BMI 27.88 kg/m   Physical Exam Constitutional:      Appearance: Normal appearance.  HENT:     Head: Normocephalic and atraumatic.  Musculoskeletal:     Comments:  Ballotable, anterior knee effusion overlying R patella.  Non tender and without erythema or warmth. Overlying skin is calloused.   ROM is normal.      Skin:    General: Skin is warm and dry.  Neurological:     General: No focal deficit present.     Mental Status: He is alert.  Psychiatric:        Mood and Affect: Mood normal.        Behavior: Behavior normal.     ------------------------------------------------------------------------------------------------------------------------------------------------------------------------------------------------------------------- Assessment and Plan  Prepatellar bursitis, right knee History of repetitive pressure to knee from kneeling while at work.  Recommend icing 3-4x per day, use of compression sleeve and knee pad to protect at work.  Start topical diclofenac 1% qid.  Follow up if not resolving.  Discussed red flags including increasing pain, warmth, redness or fever.    Meds ordered this encounter  Medications  . diclofenac Sodium (VOLTAREN) 1 % GEL    Sig: Apply 4 g topically 4 (four) times daily.    Dispense:  100 g    Refill:  1    No follow-ups on file.    This visit occurred during the SARS-CoV-2 public health emergency.  Safety protocols were in place, including screening questions prior to the visit, additional usage of staff PPE, and extensive cleaning of exam room while observing appropriate contact time as indicated for disinfecting solutions.

## 2020-05-12 ENCOUNTER — Other Ambulatory Visit: Payer: Self-pay | Admitting: *Deleted

## 2020-05-12 DIAGNOSIS — I712 Thoracic aortic aneurysm, without rupture, unspecified: Secondary | ICD-10-CM

## 2020-05-20 ENCOUNTER — Other Ambulatory Visit: Payer: Self-pay | Admitting: Family Medicine

## 2020-05-30 NOTE — Telephone Encounter (Signed)
Routing to Dr. Ashley Royalty. Pt no longer under Dr. Zollie Pee care.

## 2020-06-07 ENCOUNTER — Other Ambulatory Visit: Payer: Self-pay | Admitting: Cardiothoracic Surgery

## 2020-06-08 ENCOUNTER — Other Ambulatory Visit: Payer: Commercial Managed Care - PPO

## 2020-06-08 ENCOUNTER — Ambulatory Visit: Payer: Commercial Managed Care - PPO | Admitting: Cardiothoracic Surgery

## 2020-06-29 ENCOUNTER — Other Ambulatory Visit: Payer: Self-pay | Admitting: Family Medicine

## 2020-06-29 NOTE — Telephone Encounter (Signed)
Ok to fill x30 days but needs appt.

## 2020-07-05 ENCOUNTER — Encounter: Payer: Self-pay | Admitting: Family Medicine

## 2020-07-05 ENCOUNTER — Other Ambulatory Visit: Payer: Self-pay | Admitting: Cardiothoracic Surgery

## 2020-07-05 ENCOUNTER — Other Ambulatory Visit: Payer: Self-pay

## 2020-07-05 ENCOUNTER — Ambulatory Visit (INDEPENDENT_AMBULATORY_CARE_PROVIDER_SITE_OTHER): Payer: Commercial Managed Care - PPO | Admitting: Family Medicine

## 2020-07-05 VITALS — BP 134/65 | HR 58 | Temp 97.5°F | Ht 68.5 in | Wt 196.5 lb

## 2020-07-05 DIAGNOSIS — F320 Major depressive disorder, single episode, mild: Secondary | ICD-10-CM

## 2020-07-05 DIAGNOSIS — I712 Thoracic aortic aneurysm, without rupture, unspecified: Secondary | ICD-10-CM

## 2020-07-05 DIAGNOSIS — E785 Hyperlipidemia, unspecified: Secondary | ICD-10-CM | POA: Diagnosis not present

## 2020-07-05 DIAGNOSIS — R6882 Decreased libido: Secondary | ICD-10-CM

## 2020-07-05 DIAGNOSIS — Z125 Encounter for screening for malignant neoplasm of prostate: Secondary | ICD-10-CM

## 2020-07-05 MED ORDER — ESCITALOPRAM OXALATE 20 MG PO TABS: 20.0000 mg | ORAL_TABLET | Freq: Every day | ORAL | 3 refills | Status: DC

## 2020-07-05 MED ORDER — TADALAFIL 20 MG PO TABS: ORAL_TABLET | ORAL | 11 refills | Status: DC

## 2020-07-05 MED ORDER — PRAVASTATIN SODIUM 40 MG PO TABS: 40.0000 mg | ORAL_TABLET | Freq: Every day | ORAL | 3 refills | Status: DC

## 2020-07-05 NOTE — Patient Instructions (Signed)
Great to see you! Have labs completed and schedule annual physical to review these.

## 2020-07-05 NOTE — Assessment & Plan Note (Signed)
He is tolerating pravastatin well, continue at current strength.  Update lipid panel and LFT's

## 2020-07-05 NOTE — Assessment & Plan Note (Signed)
Pretty well controlled with lexapro.  He may be having some sexual side effects related to this.  Checking testosterone levels.  If these are normal we discussed trying addition of low dose bupropion.

## 2020-07-05 NOTE — Assessment & Plan Note (Signed)
He has upcoming CT later this week.  This is followed by CT surgery.

## 2020-07-05 NOTE — Progress Notes (Signed)
Fred Bond - 69 y.o. male MRN [id number]  Date of birth: January 03, 1951  Subjective Chief Complaint  Patient presents with  . Follow-up  . Medication Refill    HPI Fred Bond is a 69 y.o. male here today for follow up of chronic conditions of HLD, depression and ED.  He also has history of TAA and has upcoming CT for follow up as ordered by Cardiothoracic surgery. He reports that overall he is doing well at this time.   HLD is managed with pravastatin.  He is tolerating this well without myalgias.  He is due for updated labs.   Depression is doing pretty well with lexapro.  He works part time at [employer] and this time of year can be stressful.  He has had some issues with decreased libido and isn't sure if it is related to medication or low testosterone levels.  Cialis is working well for erectile issues but desire and drive is diminished.    Depression screen [facility] 2/9 07/05/2020 05/13/2019 05/13/2018  Decreased Interest 0 0 0  Down, Depressed, Hopeless 1 0 0  PHQ - 2 Score 1 0 0  Altered sleeping 1 0 1  Tired, decreased energy 1 0 0  Change in appetite 0 0 0  Feeling bad or failure about yourself  0 0 0  Trouble concentrating 0 0 0  Moving slowly or fidgety/restless 0 0 0  Suicidal thoughts 0 0 0  PHQ-9 Score 3 0 1  Difficult doing work/chores Somewhat difficult Not difficult at all Not difficult at all   GAD 7 : Generalized Anxiety Score 07/05/2020 05/13/2018 09/11/2017 03/20/2017  Nervous, Anxious, on Edge 0 0 1 1  Control/stop worrying 1 0 0 1  Worry too much - different things 0 0 1 1  Trouble relaxing 0 0 1 1  Restless 1 0 0 0  Easily annoyed or irritable 1 - 0 0  Afraid - awful might happen 0 0 0 0  Total GAD 7 Score 3 - 3 4  Anxiety Difficulty Not difficult at all Not difficult at all - -     ROS:  A comprehensive ROS was completed and negative except as noted per HPI   No Known Allergies  Past Medical History:  Diagnosis Date  . Asthma 08/07/2012   Childhood    . Depression   . Headache   . History of iron deficiency 08/07/2012  . Hypercholesterolemia   . Hypertensive retinopathy of both eyes 03/20/2017   Seen on eye exam August 2018  . Mononucleosis    at age 67  . OA (osteoarthritis) of knee    left  . Recurrent pneumonia 08/07/2012  . Tear of meniscus of left knee 08/07/2012    Past Surgical History:  Procedure Laterality Date  . COLONOSCOPY W/ BIOPSIES AND POLYPECTOMY    . EYE SURGERY    . KNEE ARTHROSCOPY     left 2014  . TONSILLECTOMY    . 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   Procedure: TOTAL KNEE ARTHROPLASTY;  Surgeon: Gean Birchwood, MD;  Location: [facility] OR;  Service: Orthopedics;  Laterality: Left;  . WISDOM TOOTH EXTRACTION      Social History   Socioeconomic History  . Marital status: Married    Spouse name: Not on file  . Number of children: Not on file  . Years of education: Not on file  . Highest education level: Not on file  Occupational History  . Not  on file  Tobacco Use  . Smoking status: Never Smoker  . Smokeless tobacco: Never Used  Vaping Use  . Vaping Use: Never used  Substance and Sexual Activity  . Alcohol use: No  . Drug use: No  . Sexual activity: Not on file  Other Topics Concern  . Not on file  Social History Narrative  . Not on file   Social Determinants of Health   Financial Resource Strain:   . Difficulty of Paying Living Expenses: Not on file  Food Insecurity:   . Worried About Programme researcher, broadcasting/film/video in the Last Year: Not on file  . Ran Out of Food in the Last Year: Not on file  Transportation Needs:   . Lack of Transportation (Medical): Not on file  . Lack of Transportation (Non-Medical): Not on file  Physical Activity:   . Days of Exercise per Week: Not on file  . Minutes of Exercise per Session: Not on file  Stress:   . Feeling of Stress : Not on file  Social Connections:   . Frequency of Communication with Friends and Family: Not on file   . Frequency of Social Gatherings with Friends and Family: Not on file  . Attends Religious Services: Not on file  . Active Member of Clubs or Organizations: Not on file  . Attends Banker Meetings: Not on file  . Marital Status: Not on file    Family History  Problem Relation Age of Onset  . Alcohol abuse Unknown        grandfather  . Heart attack Unknown        grandparents  . Cancer Mother   . Melanoma Mother   . Melanoma Father     Health Maintenance  Topic Date Due  . PNA vac Low Risk Adult (2 of 2 - PPSV23) 05/14/2019  . TETANUS/TDAP  03/30/2024  . COLONOSCOPY  06/29/2025  . INFLUENZA VACCINE  Completed  . COVID-19 Vaccine  Completed  . Hepatitis C Screening  Completed     ----------------------------------------------------------------------------------------------------------------------------------------------------------------------------------------------------------------- Physical Exam BP 134/65 (BP Location: Left Arm, Patient Position: Sitting, Cuff Size: Normal)   Pulse (!) 58   Temp (!) 97.5 F (36.4 C)   Ht 5' 8.5" (1.74 m)   Wt 196 lb 8 oz (89.1 kg)   SpO2 98%   BMI 29.44 kg/m   Physical Exam Constitutional:      Appearance: Normal appearance.  HENT:     Head: Normocephalic and atraumatic.  Eyes:     General: No scleral icterus. Cardiovascular:     Rate and Rhythm: Normal rate and regular rhythm.  Pulmonary:     Effort: Pulmonary effort is normal.     Breath sounds: Normal breath sounds.  Neurological:     General: No focal deficit present.     Mental Status: He is alert.  Psychiatric:        Mood and Affect: Mood normal.        Behavior: Behavior normal.     ------------------------------------------------------------------------------------------------------------------------------------------------------------------------------------------------------------------- Assessment and Plan  Thoracic aortic aneurysm  [facility]) He has upcoming CT later this week.  This is followed by CT surgery.   Depression, major, single episode, mild (HCC) Pretty well controlled with lexapro.  He may be having some sexual side effects related to this.  Checking testosterone levels.  If these are normal we discussed trying addition of low dose bupropion.    Hyperlipidemia with target low density lipoprotein (LDL) cholesterol less than 130 mg/dL He  is tolerating pravastatin well, continue at current strength.  Update lipid panel and LFT's     Meds ordered this encounter  Medications  . escitalopram (LEXAPRO) 20 MG tablet    Sig: Take 1 tablet (20 mg total) by mouth daily.    Dispense:  90 tablet    Refill:  3  . tadalafil (CIALIS) 20 MG tablet    Sig: TAKE 1/2 TO 1 (ONE-HALF TO ONE) TABLET BY MOUTH EVERY OTHER DAY AS NEEDED FOR  ERECTILE  DYSFUNCTION    Dispense:  15 tablet    Refill:  11  . pravastatin (PRAVACHOL) 40 MG tablet    Sig: Take 1 tablet (40 mg total) by mouth daily.    Dispense:  90 tablet    Refill:  3    No follow-ups on file.    This visit occurred during the SARS-CoV-2 public health emergency.  Safety protocols were in place, including screening questions prior to the visit, additional usage of staff PPE, and extensive cleaning of exam room while observing appropriate contact time as indicated for disinfecting solutions.

## 2020-07-06 ENCOUNTER — Ambulatory Visit
Admission: RE | Admit: 2020-07-06 | Discharge: 2020-07-06 | Disposition: A | Discharge status: Home / Self Care | Payer: Commercial Managed Care - PPO | Source: Ambulatory Visit | Attending: Cardiothoracic Surgery | Admitting: Cardiothoracic Surgery

## 2020-07-06 ENCOUNTER — Telehealth: Payer: Self-pay

## 2020-07-06 DIAGNOSIS — I712 Thoracic aortic aneurysm, without rupture, unspecified: Secondary | ICD-10-CM

## 2020-07-06 MED ORDER — IOPAMIDOL (ISOVUE-370) INJECTION 76%
75.0000 mL | Freq: Once | INTRAVENOUS | Status: AC | PRN
  Administered 2020-07-06: 75 mL via INTRAVENOUS

## 2020-07-06 NOTE — Telephone Encounter (Signed)
Erskine Squibb with [facility] contacted the office for STAT call report for patient's recent CTA.  Dr. Donata Clay made aware.  Patient is to follow-up in the office next Wednesday, 07/13/20.

## 2020-07-07 ENCOUNTER — Other Ambulatory Visit: Payer: Commercial Managed Care - PPO

## 2020-07-13 ENCOUNTER — Encounter: Payer: Self-pay | Admitting: Cardiothoracic Surgery

## 2020-07-13 ENCOUNTER — Ambulatory Visit (INDEPENDENT_AMBULATORY_CARE_PROVIDER_SITE_OTHER): Payer: Commercial Managed Care - PPO | Admitting: Cardiothoracic Surgery

## 2020-07-13 ENCOUNTER — Other Ambulatory Visit: Payer: Self-pay

## 2020-07-13 VITALS — BP 131/75 | HR 65 | Resp 18 | Ht 68.0 in | Wt 202.0 lb

## 2020-07-13 DIAGNOSIS — M314 Aortic arch syndrome [Takayasu]: Secondary | ICD-10-CM | POA: Diagnosis not present

## 2020-07-13 DIAGNOSIS — I712 Thoracic aortic aneurysm, without rupture, unspecified: Secondary | ICD-10-CM

## 2020-07-13 NOTE — Progress Notes (Signed)
PCP is Everrett Coombe, DO Referring Provider is [place], Tevion Laforge M, MD  Chief Complaint  Patient presents with  . 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  Patient remains asymptomatic.  He denies chest pain shortness of breath edema or palpitations.  Reviewed the CTA performed last week which demonstrates a stable 4.2 cm fusiform aneurysm without mural thickening or ulceration.  Lung windows are clear.  The radiologist did note a subtle abnormality in the right liver lobe which could represent a small hemangioma.  This was discussed with the patient and I will forward the report to the patient's primary physician.  Patient checks his blood pressure fairly regularly.  He is not on antihypertensive medications.  His weight has been stable and his primary physician checks his cholesterol panel and treats him with Pravachol.  Past Medical History:  Diagnosis Date  . Asthma 08/07/2012   Childhood   . Depression   . Headache   . History of iron deficiency 08/07/2012  . Hypercholesterolemia   . Hypertensive retinopathy of both eyes 03/20/2017   Seen on eye exam August 2018  . Mononucleosis    at age 67  . OA (osteoarthritis) of knee    left  . Recurrent pneumonia 08/07/2012  . Tear of meniscus of left knee 08/07/2012    Past Surgical History:  Procedure Laterality Date  . COLONOSCOPY W/ BIOPSIES AND POLYPECTOMY    . EYE SURGERY    . KNEE ARTHROSCOPY     left 2014  . TONSILLECTOMY    . 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   Procedure: TOTAL KNEE ARTHROPLASTY;  Surgeon: Gean Birchwood, MD;  Location: [facility] OR;  Service: Orthopedics;  Laterality: Left;  . WISDOM TOOTH EXTRACTION      Family History  Problem Relation Age of Onset  . Alcohol abuse Unknown        grandfather  . Heart  attack Unknown        grandparents  . Cancer Mother   . Melanoma Mother   . Melanoma Father     Social History Social History   Tobacco Use  . Smoking status: Never Smoker  . Smokeless tobacco: Never Used  Vaping Use  . Vaping Use: Never used  Substance Use Topics  . Alcohol use: No  . Drug use: No    Current Outpatient Medications  Medication Sig Dispense Refill  . escitalopram (LEXAPRO) 20 MG tablet Take 1 tablet (20 mg total) by mouth daily. 90 tablet 3  . flintstones complete (FLINTSTONES) 60 MG chewable tablet Chew 1 tablet by mouth daily. With iron    . L-Lysine 500 MG TABS Take 500 mg by mouth daily.     . Multiple Vitamins-Minerals (CENTRUM SILVER ULTRA MENS) TABS Take 1 tablet by mouth daily.     . niacin 500 MG tablet Take 500 mg by mouth daily with breakfast.    . pravastatin (PRAVACHOL) 40 MG tablet Take 1 tablet (40 mg total) by mouth daily. 90 tablet 3  . tadalafil (CIALIS) 20 MG tablet TAKE 1/2 TO 1 (ONE-HALF TO ONE) TABLET BY MOUTH EVERY OTHER DAY AS NEEDED FOR  ERECTILE  DYSFUNCTION 15 tablet 11   No current facility-administered medications for this visit.    No Known Allergies  Review of Systems  Weight stable No dizziness or presyncope Tries to walk regularly 20 minutes daily No cough change in taste or smell or fatigue No abdominal pain melena or jaundice No hematuria polyuria or dysuria  BP 131/75 (BP Location: Left Arm, Patient Position: Sitting)   Pulse 65   Resp 18   Ht 5\' 8"  (1.727 m)   Wt 202 lb (91.6 kg)   SpO2 99% Comment: RA with mask on  BMI 30.71 kg/m  Physical Exam      Exam    General- alert and comfortable    Neck- no JVD, no cervical adenopathy palpable, no carotid bruit   Lungs- clear without rales, wheezes   Cor- regular rate and rhythm, no murmur , gallop   Abdomen- soft, non-tender   Extremities - warm, non-tender, minimal edema   Neuro- oriented, appropriate, no focal weakness   Diagnostic Tests: Recent CT  images personally viewed showing no change in the mild fusiform ascending aneurysm stable at 4.2 cm for over 5 years.  Impression: Although the risk of aortic dissection remains low the patient should have continued monitoring of his ascending aorta.  We will continue to check surveillance CT scans at 53-month intervals.  The patient understands importance of blood pressure monitoring and maintaining systolic blood pressure less than 140 mmHg.  Plan: Return in 18 months for surveillance CT scan of a stable 4.2 cm fusiform ascending aneurysm.   15-month, MD [facility] [phone]

## 2020-08-03 LAB — COMPLETE METABOLIC PANEL WITH GFR
AG Ratio: 1.4 (calc) (ref 1.0–2.5)
ALT: 26 U/L (ref 9–46)
AST: 34 U/L (ref 10–35)
Albumin: 4.2 g/dL (ref 3.6–5.1)
Alkaline phosphatase (APISO): 64 U/L (ref 35–144)
BUN: 14 mg/dL (ref 7–25)
CO2: 28 mmol/L (ref 20–32)
Calcium: 9.4 mg/dL (ref 8.6–10.3)
Chloride: 102 mmol/L (ref 98–110)
Creat: 0.97 mg/dL (ref 0.70–1.25)
GFR, Est African American: 93 mL/min/{1.73_m2} (ref >=60)
GFR, Est Non African American: 80 mL/min/{1.73_m2} (ref >=60)
Globulin: 3 g/dL (calc) (ref 1.9–3.7)
Glucose, Bld: 80 mg/dL (ref 65–99)
Potassium: 4.2 mmol/L (ref 3.5–5.3)
Sodium: 137 mmol/L (ref 135–146)
Total Bilirubin: 0.6 mg/dL (ref 0.2–1.2)
Total Protein: 7.2 g/dL (ref 6.1–8.1)

## 2020-08-03 LAB — TESTOSTERONE: Testosterone: 892 ng/dL — ABNORMAL HIGH (ref 250–827)

## 2020-08-03 LAB — LIPID PANEL
Cholesterol: 180 mg/dL (ref <200)
HDL: 75 mg/dL (ref >=40)
LDL Cholesterol (Calc): 90 mg/dL (calc)
Non-HDL Cholesterol (Calc): 105 mg/dL (calc) (ref <130)
Total CHOL/HDL Ratio: 2.4 (calc) (ref <5.0)
Triglycerides: 65 mg/dL (ref <150)

## 2020-08-03 LAB — CBC
HCT: 41.9 % (ref 38.5–50.0)
Hemoglobin: 14.5 g/dL (ref 13.2–17.1)
MCH: 33.5 pg — ABNORMAL HIGH (ref 27.0–33.0)
MCHC: 34.6 g/dL (ref 32.0–36.0)
MCV: 96.8 fL (ref 80.0–100.0)
MPV: 9.1 fL (ref 7.5–12.5)
Platelets: 244 10*3/uL (ref 140–400)
RBC: 4.33 10*6/uL (ref 4.20–5.80)
RDW: 11.9 % (ref 11.0–15.0)
WBC: 7.7 10*3/uL (ref 3.8–10.8)

## 2020-08-03 LAB — PSA: PSA: 1.53 ng/mL (ref <=4.0)

## 2020-09-29 ENCOUNTER — Ambulatory Visit (INDEPENDENT_AMBULATORY_CARE_PROVIDER_SITE_OTHER): Payer: Commercial Managed Care - PPO | Admitting: Family Medicine

## 2020-09-29 ENCOUNTER — Encounter: Payer: Self-pay | Admitting: Family Medicine

## 2020-09-29 ENCOUNTER — Other Ambulatory Visit: Payer: Self-pay

## 2020-09-29 VITALS — BP 123/65 | HR 77 | Wt 202.0 lb

## 2020-09-29 DIAGNOSIS — E559 Vitamin D deficiency, unspecified: Secondary | ICD-10-CM

## 2020-09-29 DIAGNOSIS — M940 Chondrocostal junction syndrome [Tietze]: Secondary | ICD-10-CM | POA: Diagnosis not present

## 2020-09-29 MED ORDER — MELOXICAM 7.5 MG PO TABS: ORAL_TABLET | ORAL | 0 refills | Status: DC

## 2020-09-29 NOTE — Assessment & Plan Note (Addendum)
Symptoms and exam consistent with costochondritis.  Doubt cardiac etiology based on history.  Recommend application of heat to chest wall as needed for comfort.  Check vitamin d levels.  Adding meloxicam 7.5 mg.  He may take this 1-2x per day as needed.

## 2020-09-29 NOTE — Progress Notes (Signed)
Fred Bond - 70 y.o. male MRN [id number]  Date of birth: 1951-06-15  Subjective Chief Complaint  Patient presents with  . Gastroesophageal Reflux    HPI Fred Bond is a 70 y.o. male here today with complaint of chest pain.  Pain is located in the anterior chest and he first noticed this a couple of weeks ago.  It is present most of the time but does get worse with certain movements. Describes this pain as sharp, stinging type pain.  He denies tightness or pressure, shortness of breath, or radiation of pain.  He denies reflux symptoms.  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    ROS:  A comprehensive ROS was completed and negative except as noted per HPI  No Known Allergies  Past Medical History:  Diagnosis Date  . Asthma 08/07/2012   Childhood   . Depression   . Headache   . History of iron deficiency 08/07/2012  . Hypercholesterolemia   . Hypertensive retinopathy of both eyes 03/20/2017   Seen on eye exam August 2018  . Mononucleosis    at age 10  . OA (osteoarthritis) of knee    left  . Recurrent pneumonia 08/07/2012  . Tear of meniscus of left knee 08/07/2012    Past Surgical History:  Procedure Laterality Date  . COLONOSCOPY W/ BIOPSIES AND POLYPECTOMY    . EYE SURGERY    . KNEE ARTHROSCOPY     left 2014  . TONSILLECTOMY    . 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   Procedure: TOTAL KNEE ARTHROPLASTY;  Surgeon: Gean Birchwood, MD;  Location: [facility] OR;  Service: Orthopedics;  Laterality: Left;  . WISDOM TOOTH EXTRACTION      Social History   Socioeconomic History  . Marital status: Married    Spouse name: Not on file  . Number of children: Not on file  . Years of education: Not on file  . Highest education level: Not on file  Occupational History  . Not on file  Tobacco Use  . Smoking status: Never Smoker  . Smokeless tobacco: Never Used  Vaping Use  . Vaping  Use: Never used  Substance and Sexual Activity  . Alcohol use: No  . Drug use: No  . Sexual activity: Not on file  Other Topics Concern  . Not on file  Social History Narrative  . Not on file   Social Determinants of Health   Financial Resource Strain: Not on file  Food Insecurity: Not on file  Transportation Needs: Not on file  Physical Activity: Not on file  Stress: Not on file  Social Connections: Not on file    Family History  Problem Relation Age of Onset  . Alcohol abuse Unknown        grandfather  . Heart attack Unknown        grandparents  . Cancer Mother   . Melanoma Mother   . Melanoma Father     Health Maintenance  Topic Date Due  . PNA vac Low Risk Adult (2 of 2 - PPSV23) 05/14/2019  . COVID-19 Vaccine (3 - Booster for Pfizer series) 04/09/2020  . TETANUS/TDAP  03/30/2024  . COLONOSCOPY (Pts 45-98yrs Insurance coverage will need to be confirmed)  06/29/2025  . INFLUENZA VACCINE  Completed  . Hepatitis C Screening  Completed  . HPV VACCINES  Aged Out     ----------------------------------------------------------------------------------------------------------------------------------------------------------------------------------------------------------------- Physical Exam  BP 123/65 (BP Location: Left Arm, Patient Position: Sitting, Cuff Size: Normal)   Pulse 77   Wt 202 lb (91.6 kg)   SpO2 95%   BMI 30.71 kg/m   Physical Exam Constitutional:      Appearance: Normal appearance.  Eyes:     General: No scleral icterus. Cardiovascular:     Rate and Rhythm: Normal rate and regular rhythm.  Pulmonary:     Effort: Pulmonary effort is normal.     Breath sounds: Normal breath sounds.  Chest:     Chest wall: Tenderness (ttp along sternochondral junction on L side) present.  Musculoskeletal:     Cervical back: Neck supple.  Neurological:     General: No focal deficit present.     Mental Status: He is alert.  Psychiatric:        Mood and Affect:  Mood normal.        Behavior: Behavior normal.      ------------------------------------------------------------------------------------------------------------------------------------------------------------------------------------------------------------------- Assessment and Plan  Costochondritis Symptoms and exam consistent with costochondritis.  Doubt cardiac etiology based on history.  Recommend application of heat to chest wall as needed for comfort.  Check vitamin d levels.  Adding meloxicam 7.5 mg.  He may take this 1-2x per day as needed.     Meds ordered this encounter  Medications  . meloxicam (MOBIC) 7.5 MG tablet    Sig: Take 1 tab 1-2 times per day as needed.    Dispense:  30 tablet    Refill:  0    No follow-ups on file.    This visit occurred during the SARS-CoV-2 public health emergency.  Safety protocols were in place, including screening questions prior to the visit, additional usage of staff PPE, and extensive cleaning of exam room while observing appropriate contact time as indicated for disinfecting solutions.

## 2020-09-29 NOTE — Patient Instructions (Signed)
Costochondritis  Costochondritis is irritation and swelling (inflammation) of the tissue that connects the ribs to the breastbone (sternum). This tissue is called cartilage. Costochondritis causes pain in the front of the chest. Usually, the pain:  Starts slowly.  Is in more than one rib. What are the causes? The exact cause of this condition is not always known. It results from stress on the tissue in the affected area. The cause of this stress could be:  Chest injury.  Exercise or activity, such as lifting.  Very bad coughing. What increases the risk? You are more likely to develop this condition if you:  Are male.  Are 30-40 years old.  Recently started a new exercise or work activity.  Have low levels of vitamin D.  Have a condition that makes you cough often. What are the signs or symptoms? The main symptom of this condition is chest pain. The pain:  Usually starts slowly and can be sharp or dull.  Gets worse with deep breathing, coughing, or exercise.  Gets better with rest.  May be worse when you press on the affected area of your ribs and breastbone. How is this treated? This condition usually goes away on its own over time. Your doctor may prescribe an NSAID, such as ibuprofen. This can help reduce pain and inflammation. Treatment may also include:  Resting and avoiding activities that make pain worse.  Putting heat or ice on the painful area.  Doing exercises to stretch your chest muscles. If these treatments do not help, your doctor may inject a numbing medicine to help relieve the pain. Follow these instructions at home: Managing pain, stiffness, and swelling  If told, put ice on the painful area. To do this: ? Put ice in a plastic bag. ? Place a towel between your skin and the bag. ? Leave the ice on for 20 minutes, 2-3 times a day.  If told, put heat on the affected area. Do this as often as told by your doctor. Use the heat source that your  doctor recommends, such as a moist heat pack or a heating pad. ? Place a towel between your skin and the heat source. ? Leave the heat on for 20-30 minutes. ? Take off the heat if your skin turns bright red. This is very important if you cannot feel pain, heat, or cold. You may have a greater risk of getting burned.      Activity  Rest as told by your doctor.  Do not do anything that makes your pain worse. This includes any activities that use chest, belly (abdomen), and side muscles.  Do not lift anything that is heavier than 10 lb (4.5 kg), or the limit that you are told, until your doctor says that it is safe.  Return to your normal activities as told by your doctor. Ask your doctor what activities are safe for you. General instructions  Take over-the-counter and prescription medicines only as told by your doctor.  Keep all follow-up visits as told by your doctor. This is important. Contact a doctor if:  You have chills or a fever.  Your pain does not go away or it gets worse.  You have a cough that does not go away. Get help right away if:  You are short of breath.  You have very bad chest pain that is not helped by medicines, heat, or ice. These symptoms may be an emergency. Do not wait to see if the symptoms will go away. Get   medical help right away. Call your local emergency services (911 in the [place] Do not drive yourself to the hospital. Summary  Costochondritis is irritation and swelling (inflammation) of the tissue that connects the ribs to the breastbone (sternum).  This condition causes pain in the front of the chest.  Treatment may include medicines, rest, heat or ice, and exercises. This information is not intended to replace advice given to you by your health care provider. Make sure you discuss any questions you have with your health care provider. Document Revised: 05/29/2019 Document Reviewed: 05/29/2019 Elsevier Patient Education  2021 [employer]vier Inc.  

## 2020-09-30 LAB — VITAMIN D 25 HYDROXY (VIT D DEFICIENCY, FRACTURES): Vit D, 25-Hydroxy: 45 ng/mL (ref 30–100)

## 2021-02-24 ENCOUNTER — Telehealth: Payer: Self-pay

## 2021-02-24 NOTE — Telephone Encounter (Signed)
Please call patient and advise per Dr. Ashley Royalty he needs to schedule an appointment for Pre-Op clearance.

## 2021-02-27 NOTE — Telephone Encounter (Signed)
Scheduled pt for this Thursday

## 2021-03-02 ENCOUNTER — Encounter: Payer: Self-pay | Admitting: Family Medicine

## 2021-03-02 ENCOUNTER — Ambulatory Visit (INDEPENDENT_AMBULATORY_CARE_PROVIDER_SITE_OTHER): Payer: Commercial Managed Care - PPO | Admitting: Family Medicine

## 2021-03-02 ENCOUNTER — Other Ambulatory Visit: Payer: Self-pay

## 2021-03-02 VITALS — BP 136/66 | HR 70 | Temp 97.8°F | Ht 71.0 in | Wt 204.3 lb

## 2021-03-02 DIAGNOSIS — F320 Major depressive disorder, single episode, mild: Secondary | ICD-10-CM | POA: Diagnosis not present

## 2021-03-02 DIAGNOSIS — Z01818 Encounter for other preprocedural examination: Secondary | ICD-10-CM | POA: Diagnosis not present

## 2021-03-02 NOTE — Patient Instructions (Signed)
Try cutting back on lexapro to 1/2 tab daily Follow up with me after recovering from surgery.

## 2021-03-02 NOTE — Assessment & Plan Note (Signed)
He is fairly low risk for cardiac complications related to orthopedic surgery. Preop labs ordered today including BMP, CBC with differential and PT/INR. BMI is within normal limits for surgery.

## 2021-03-02 NOTE — Progress Notes (Signed)
Fred Bond - 70 y.o. male MRN [id number]  Date of birth: 1950-08-15  Subjective No chief complaint on file.   HPI Fred Bond is a 70 year old male here today for preop examination.  He has history of hyperlipidemia and thoracic aortic aneurysm.  Aneurysm is followed annually by cardiothoracic surgery and has been stable.  His hyperlipidemia has been well managed with pravastatin.  He is having elective total knee arthroplasty.  He has not had any anginal symptoms or respiratory difficulties.  He has not had any problems with anesthesia in the past.  He does need updated labs prior to surgery.  He also wonders if he needs to continue on Lexapro.  He has been on this for several years which was started during a more difficult time in his life.  He feels like things are much better and does not know if he needs to continue this.  ROS:  A comprehensive ROS was completed and negative except as noted per HPI  No Known Allergies  Past Medical History:  Diagnosis Date   Asthma 08/07/2012   Childhood    Depression    Headache    History of iron deficiency 08/07/2012   Hypercholesterolemia    Hypertensive retinopathy of both eyes 03/20/2017   Seen on eye exam August 2018   Mononucleosis    at age 7   OA (osteoarthritis) of knee    left   Recurrent pneumonia 08/07/2012   Tear of meniscus of left knee 08/07/2012    Past Surgical History:  Procedure Laterality Date   COLONOSCOPY W/ BIOPSIES AND POLYPECTOMY     EYE SURGERY     KNEE ARTHROSCOPY     left 2014   TONSILLECTOMY     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   Procedure: TOTAL KNEE ARTHROPLASTY;  Surgeon: Gean Birchwood, MD;  Location: [facility] OR;  Service: Orthopedics;  Laterality: Left;   WISDOM TOOTH EXTRACTION      Social History   Socioeconomic History   Marital status: Married    Spouse name: Not on file   Number of children: Not on file   Years of education: Not on file    Highest education level: Not on file  Occupational History   Not on file  Tobacco Use   Smoking status: Never   Smokeless tobacco: Never  Vaping Use   Vaping Use: Never used  Substance and Sexual Activity   Alcohol use: No   Drug use: No   Sexual activity: Not on file  Other Topics Concern   Not on file  Social History Narrative   Not on file   Social Determinants of Health   Financial Resource Strain: Not on file  Food Insecurity: Not on file  Transportation Needs: Not on file  Physical Activity: Not on file  Stress: Not on file  Social Connections: Not on file    Family History  Problem Relation Age of Onset   Alcohol abuse Unknown        grandfather   Heart attack Unknown        grandparents   Cancer Mother    Melanoma Mother    Melanoma Father     Health Maintenance  Topic Date Due   Zoster Vaccines- Shingrix (1 of 2) Never done   PNA vac Low Risk Adult (2 of 2 - PPSV23) 05/14/2019   COVID-19 Vaccine (3 - Booster for Pfizer series) 03/09/2020   INFLUENZA VACCINE  02/27/2021  TETANUS/TDAP  03/30/2024   COLONOSCOPY (Pts 45-84yrs Insurance coverage will need to be confirmed)  06/29/2025   Hepatitis C Screening  Completed   HPV VACCINES  Aged Out     ----------------------------------------------------------------------------------------------------------------------------------------------------------------------------------------------------------------- Physical Exam BP 136/66 (BP Location: Left Arm, Patient Position: Sitting, Cuff Size: Normal)   Pulse 70   Temp 97.8 F (36.6 C)   Ht 5\' 11"  (1.803 m)   Wt 204 lb 4.8 oz (92.7 kg)   SpO2 99%   BMI 28.49 kg/m   Physical Exam Constitutional:      General: He is not in acute distress. HENT:     Head: Normocephalic and atraumatic.     Right Ear: Tympanic membrane and external ear normal.     Left Ear: Tympanic membrane and external ear normal.  Eyes:     General: No scleral icterus. Neck:      Thyroid: No thyromegaly.  Cardiovascular:     Rate and Rhythm: Normal rate and regular rhythm.     Heart sounds: Normal heart sounds.  Pulmonary:     Effort: Pulmonary effort is normal.     Breath sounds: Normal breath sounds.  Abdominal:     General: Bowel sounds are normal. There is no distension.     Palpations: Abdomen is soft.     Tenderness: There is no abdominal tenderness. There is no guarding.  Musculoskeletal:     Cervical back: Normal range of motion.  Lymphadenopathy:     Cervical: No cervical adenopathy.  Skin:    General: Skin is warm and dry.     Findings: No rash.  Neurological:     Mental Status: He is alert and oriented to person, place, and time.     Cranial Nerves: No cranial nerve deficit.     Motor: No abnormal muscle tone.  Psychiatric:        Mood and Affect: Mood normal.        Behavior: Behavior normal.    ------------------------------------------------------------------------------------------------------------------------------------------------------------------------------------------------------------------- Assessment and Plan  Pre-operative exam He is fairly low risk for cardiac complications related to orthopedic surgery. Preop labs ordered today including BMP, CBC with differential and PT/INR. BMI is within normal limits for surgery.  Depression, major, single episode, mild (HCC) He feels like his depressive symptoms have improved significantly since he started taking Lexapro several years ago.  He would like to try cutting back and eventually stopping this.  Will reduce to 10 mg daily.   No orders of the defined types were placed in this encounter.   No follow-ups on file.    This visit occurred during the SARS-CoV-2 public health emergency.  Safety protocols were in place, including screening questions prior to the visit, additional usage of staff PPE, and extensive cleaning of exam room while observing appropriate contact time as  indicated for disinfecting solutions.

## 2021-03-02 NOTE — Assessment & Plan Note (Signed)
He feels like his depressive symptoms have improved significantly since he started taking Lexapro several years ago.  He would like to try cutting back and eventually stopping this.  Will reduce to 10 mg daily.

## 2021-03-03 LAB — CBC WITH DIFFERENTIAL/PLATELET
Absolute Monocytes: 800 cells/uL (ref 200–950)
Basophils Absolute: 52 cells/uL (ref 0–200)
Basophils Relative: 0.8 %
Eosinophils Absolute: 169 cells/uL (ref 15–500)
Eosinophils Relative: 2.6 %
HCT: 41.4 % (ref 38.5–50.0)
Hemoglobin: 13.6 g/dL (ref 13.2–17.1)
Lymphs Abs: 1573 cells/uL (ref 850–3900)
MCH: 32.1 pg (ref 27.0–33.0)
MCHC: 32.9 g/dL (ref 32.0–36.0)
MCV: 97.6 fL (ref 80.0–100.0)
MPV: 9.2 fL (ref 7.5–12.5)
Monocytes Relative: 12.3 %
Neutro Abs: 3907 cells/uL (ref 1500–7800)
Neutrophils Relative %: 60.1 %
Platelets: 249 10*3/uL (ref 140–400)
RBC: 4.24 10*6/uL (ref 4.20–5.80)
RDW: 13.4 % (ref 11.0–15.0)
Total Lymphocyte: 24.2 %
WBC: 6.5 10*3/uL (ref 3.8–10.8)

## 2021-03-03 LAB — BASIC METABOLIC PANEL
BUN: 15 mg/dL (ref 7–25)
CO2: 30 mmol/L (ref 20–32)
Calcium: 9.2 mg/dL (ref 8.6–10.3)
Chloride: 101 mmol/L (ref 98–110)
Creat: 1.12 mg/dL (ref 0.70–1.35)
Glucose, Bld: 89 mg/dL (ref 65–99)
Potassium: 4.3 mmol/L (ref 3.5–5.3)
Sodium: 138 mmol/L (ref 135–146)

## 2021-03-03 LAB — PROTIME-INR
INR: 1
Prothrombin Time: 10.3 s (ref 9.0–11.5)

## 2021-04-06 NOTE — Telephone Encounter (Signed)
Error/disregard

## 2021-04-17 ENCOUNTER — Ambulatory Visit (INDEPENDENT_AMBULATORY_CARE_PROVIDER_SITE_OTHER): Payer: Commercial Managed Care - PPO

## 2021-04-17 ENCOUNTER — Other Ambulatory Visit: Payer: Self-pay

## 2021-04-17 DIAGNOSIS — Z0000 Encounter for general adult medical examination without abnormal findings: Secondary | ICD-10-CM

## 2021-04-17 NOTE — Progress Notes (Signed)
Virtual Visit via Telephone Note  I connected with  Fred Bond on 04/17/21 at  5:40 PM EDT by telephone and verified that I am speaking with the correct person using two identifiers.  Medicare Annual Wellness visit completed telephonically due to Covid-19 pandemic.   Persons participating in this call: This Health Coach and this patient.   Location: Patient: Home Provider: Office   I discussed the limitations, risks, security and privacy concerns of performing an evaluation and management service by telephone and the availability of in person appointments. The patient expressed understanding and agreed to proceed.  Unable to perform video visit due to video visit attempted and failed and/or patient does not have video capability.   Some vital signs may be absent or patient reported.   Marzella Schlein, LPN   Subjective:   Fred Bond is a 70 y.o. male who presents for Medicare Annual/Subsequent preventive examination.  Review of Systems     Cardiac Risk Factors include: advanced age (>18men, >34 women);hypertension;dyslipidemia;male gender     Objective:    There were no vitals filed for this visit. There is no height or weight on file to calculate BMI.  Advanced Directives 04/17/2021 05/02/2017 04/22/2017 07/25/2016 04/01/2015  Does Patient Have a Medical Advance Directive? Yes Yes Yes Yes Yes  Type of Estate agent of [employer] Power of [place];Living will Healthcare Power of [place];Living will Living will Healthcare Power of [place];Living will  Does patient want to make changes to medical advance directive? - No - Patient declined No - Patient declined - -  Copy of Healthcare Power of Attorney in Chart? No - copy requested No - copy requested No - copy requested - -    Current Medications (verified) Outpatient Encounter Medications as of 04/17/2021  Medication Sig   ASPIRIN LOW DOSE 81 MG EC tablet Take 81 mg by mouth. After  surgery only   escitalopram (LEXAPRO) 20 MG tablet Take 1 tablet (20 mg total) by mouth daily. (Patient taking differently: Take 10 mg by mouth daily.)   flintstones complete (FLINTSTONES) 60 MG chewable tablet Chew 1 tablet by mouth daily. With iron   L-Lysine 500 MG TABS Take 500 mg by mouth daily.    Multiple Vitamins-Minerals (CENTRUM SILVER ULTRA MENS) TABS Take 1 tablet by mouth daily.    niacin 500 MG tablet Take 500 mg by mouth daily with breakfast.   pravastatin (PRAVACHOL) 40 MG tablet Take 1 tablet (40 mg total) by mouth daily.   tadalafil (CIALIS) 20 MG tablet TAKE 1/2 TO 1 (ONE-HALF TO ONE) TABLET BY MOUTH EVERY OTHER DAY AS NEEDED FOR  ERECTILE  DYSFUNCTION   [DISCONTINUED] meloxicam (MOBIC) 7.5 MG tablet Take 1 tab 1-2 times per day as needed.   No facility-administered encounter medications on file as of 04/17/2021.    Allergies (verified) Patient has no known allergies.   History: Past Medical History:  Diagnosis Date   Asthma 08/07/2012   Childhood    Depression    Headache    History of iron deficiency 08/07/2012   Hypercholesterolemia    Hypertensive retinopathy of both eyes 03/20/2017   Seen on eye exam August 2018   Mononucleosis    at age 26   OA (osteoarthritis) of knee    left   Recurrent pneumonia 08/07/2012   Tear of meniscus of left knee 08/07/2012   Past Surgical History:  Procedure Laterality Date   COLONOSCOPY W/ BIOPSIES AND POLYPECTOMY     EYE SURGERY  KNEE ARTHROSCOPY     left 2014   TONSILLECTOMY     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   Procedure: TOTAL KNEE ARTHROPLASTY;  Surgeon: Gean Birchwood, MD;  Location: [facility] OR;  Service: Orthopedics;  Laterality: Left;   WISDOM TOOTH EXTRACTION     Family History  Problem Relation Age of Onset   Alcohol abuse Unknown        grandfather   Heart attack Unknown        grandparents   Cancer Mother    Melanoma Mother    Melanoma Father    Social History    Socioeconomic History   Marital status: Married    Spouse name: Not on file   Number of children: Not on file   Years of education: Not on file   Highest education level: Not on file  Occupational History   Not on file  Tobacco Use   Smoking status: Never   Smokeless tobacco: Never  Vaping Use   Vaping Use: Never used  Substance and Sexual Activity   Alcohol use: No   Drug use: No   Sexual activity: Not on file  Other Topics Concern   Not on file  Social History Narrative   Not on file   Social Determinants of Health   Financial Resource Strain: Low Risk    Difficulty of Paying Living Expenses: Not hard at all  Food Insecurity: No Food Insecurity   Worried About Programme researcher, broadcasting/film/video in the Last Year: Never true   Ran Out of Food in the Last Year: Never true  Transportation Needs: No Transportation Needs   Lack of Transportation (Medical): No   Lack of Transportation (Non-Medical): No  Physical Activity: Sufficiently Active   Days of Exercise per Week: 5 days   Minutes of Exercise per Session: 30 min  Stress: No Stress Concern Present   Feeling of Stress : Not at all  Social Connections: Moderately Integrated   Frequency of Communication with Friends and Family: More than three times a week   Frequency of Social Gatherings with Friends and Family: More than three times a week   Attends Religious Services: 1 to 4 times per year   Active Member of [employer] or Organizations: No   Attends Engineer, structural: Never   Marital Status: Married    Tobacco Counseling Counseling given: Not Answered   Clinical Intake:  Pre-visit preparation completed: Yes  Pain : No/denies pain     BMI - recorded: 28.51 Nutritional Status: BMI 25 -29 Overweight Nutritional Risks: None Diabetes: No  How often do you need to have someone help you when you read instructions, pamphlets, or other written materials from your doctor or pharmacy?: 1 -  Never  Diabetic?no  Interpreter Needed?: No  Information entered by :: Lanier Ensign, LPN   Activities of Daily Living In your present state of health, do you have any difficulty performing the following activities: 04/17/2021  Hearing? N  Vision? N  Difficulty concentrating or making decisions? N  Walking or climbing stairs? N  Dressing or bathing? N  Doing errands, shopping? N  Preparing Food and eating ? N  Using the Toilet? N  In the past six months, have you accidently leaked urine? N  Do you have problems with loss of bowel control? N  Managing your Medications? N  Managing your Finances? N  Housekeeping or managing your Housekeeping? N  Some recent data might be  hidden    Patient Care Team: Everrett Coombe, DO as PCP - General (Family Medicine)  Indicate any recent Medical Services you may have received from other than [facility] providers in the past year (date may be approximate).     Assessment:   This is a routine wellness examination for [place].  Hearing/Vision screen Hearing Screening - Comments:: Pt denies any hearing issues  Vision Screening - Comments:: Pt follows up with Dr Clearance Coots in [place] for eye exams   Dietary issues and exercise activities discussed: Current Exercise Habits: Home exercise routine, Type of exercise: Other - see comments, Time (Minutes): 30, Frequency (Times/Week): 5, Weekly Exercise (Minutes/Week): 150   Goals Addressed             This Visit's Progress    Patient Stated       Lose weight        Depression Screen PHQ 2/9 Scores 04/17/2021 03/02/2021 07/05/2020 05/13/2019 05/13/2018 09/11/2017 03/20/2017  PHQ - 2 Score 0 0 1 0 0 0 1  PHQ- 9 Score - 0 3 0 1 2 3     Fall Risk Fall Risk  04/17/2021 03/02/2021 09/11/2017  Falls in the past year? 0 0 No  Number falls in past yr: 0 0 -  Injury with Fall? 0 0 -  Risk for fall due to : Impaired vision No Fall Risks -  Follow up Falls prevention discussed Falls evaluation completed -     FALL RISK PREVENTION PERTAINING TO THE HOME:  Any stairs in or around the home? Yes  If so, are there any without handrails? No  Home free of loose throw rugs in walkways, pet beds, electrical cords, etc? Yes  Adequate lighting in your home to reduce risk of falls? Yes   ASSISTIVE DEVICES UTILIZED TO PREVENT FALLS:  Life alert? No  Use of a cane, walker or w/c? No  Grab bars in the bathroom? No  Shower chair or bench in shower? Yes  Elevated toilet seat or a handicapped toilet? No   TIMED UP AND GO:  Was the test performed? No .   Cognitive Function:     6CIT Screen 04/17/2021 05/13/2018  What Year? 0 points 0 points  What month? 0 points 0 points  What time? 0 points 0 points  Count back from 20 0 points 0 points  Months in reverse 0 points 0 points  Repeat phrase 0 points 0 points  Total Score 0 0    Immunizations Immunization History  Administered Date(s) Administered   Influenza, High Dose Seasonal PF 05/01/2018   Influenza,inj,Quad PF,6+ Mos 03/26/2016   Influenza-Unspecified 05/18/2017, 04/24/2019, 06/09/2020   PFIZER(Purple Top)SARS-COV-2 Vaccination 09/18/2019, 10/08/2019   Pneumococcal Conjugate-13 05/13/2018   Td 07/31/2003   Tdap 03/30/2014    TDAP status: Up to date  Flu Vaccine status: Due, Education has been provided regarding the importance of this vaccine. Advised may receive this vaccine at local pharmacy or Health Dept. Aware to provide a copy of the vaccination record if obtained from local pharmacy or Health Dept. Verbalized acceptance and understanding.  Pneumococcal vaccine status: Up to date  Covid-19 vaccine status: Completed vaccines  Qualifies for Shingles Vaccine? Yes   Zostavax completed No   Shingrix Completed?: No.    Education has been provided regarding the importance of this vaccine. Patient has been advised to call insurance company to determine out of pocket expense if they have not yet received this vaccine. Advised may  also receive vaccine at local pharmacy  or Health Dept. Verbalized acceptance and understanding.  Screening Tests Health Maintenance  Topic Date Due   Zoster Vaccines- Shingrix (1 of 2) Never done   COVID-19 Vaccine (3 - Booster for Pfizer series) 03/09/2020   INFLUENZA VACCINE  02/27/2021   TETANUS/TDAP  03/30/2024   COLONOSCOPY (Pts 45-76yrs Insurance coverage will need to be confirmed)  06/29/2025   Hepatitis C Screening  Completed   HPV VACCINES  Aged Out    Health Maintenance  Health Maintenance Due  Topic Date Due   Zoster Vaccines- Shingrix (1 of 2) Never done   COVID-19 Vaccine (3 - Booster for Pfizer series) 03/09/2020   INFLUENZA VACCINE  02/27/2021    Colorectal cancer screening: Type of screening: Colonoscopy. Completed 06/30/15. Repeat every 10 years   Hepatitis C Screening:  Completed 07/30/16  Vision Screening: Recommended annual ophthalmology exams for early detection of glaucoma and other disorders of the eye. Is the patient up to date with their annual eye exam?  Yes  Who is the provider or what is the name of the office in which the patient attends annual eye exams? Dr Clear[facility] Coots [name] If pt is not established with a provider, would they like to be referred to a provider to establish care? No .   Dental Screening: Recommended annual dental exams for proper oral hygiene  Community Resource Referral / Chronic Care Management: CRR required this visit?  No   CCM required this visit?  No      Plan:     I have personally reviewed and noted the following in the patient's chart:   Medical and social history Use of alcohol, tobacco or illicit drugs  Current medications and supplements including opioid prescriptions. Patient is not currently taking opioid prescriptions. Functional ability and status Nutritional status Physical activity Advanced directives List of other physicians Hospitalizations, surgeries, and ER visits in previous 12  months Vitals Screenings to include cognitive, depression, and falls Referrals and appointments  In addition, I have reviewed and discussed with patient certain preventive protocols, quality metrics, and best practice recommendations. A written personalized care plan for preventive services as well as general preventive health recommendations were provided to patient.     Marzella Schlein, LPN   [id number]   Nurse Notes: None

## 2021-04-17 NOTE — Patient Instructions (Signed)
Fred Bond , Thank you for taking time to come for your Medicare Wellness Visit. I appreciate your ongoing commitment to your health goals. Please review the following plan we discussed and let me know if I can assist you in the future.   Screening recommendations/referrals: Colonoscopy: Done 06/30/15 repeat every 10 years due 06/29/25 Recommended yearly ophthalmology/optometry visit for glaucoma screening and checkup Recommended yearly dental visit for hygiene and checkup  Vaccinations: Influenza vaccine: Due Pneumococcal vaccine: Up to date Tdap vaccine: Completed 03/30/14 repeat every 10 years  Shingles vaccine: Shingrix discussed. Please contact your pharmacy for coverage information.    Covid-19: Completed 2/19 & 10/08/19  Advanced directives: Please bring a copy of your health care power of attorney and living will to the office at your convenience.  Conditions/risks identified: lose weight   Next appointment: Follow up in one year for your annual wellness visit.   Preventive Care 102 Years and Older, Male Preventive care refers to lifestyle choices and visits with your health care provider that can promote health and wellness. What does preventive care include? A yearly physical exam. This is also called an annual well check. Dental exams once or twice a year. Routine eye exams. Ask your health care provider how often you should have your eyes checked. Personal lifestyle choices, including: Daily care of your teeth and gums. Regular physical activity. Eating a healthy diet. Avoiding tobacco and drug use. Limiting alcohol use. Practicing safe sex. Taking low doses of aspirin every day. Taking vitamin and mineral supplements as recommended by your health care provider. What happens during an annual well check? The services and screenings done by your health care provider during your annual well check will depend on your age, overall health, lifestyle risk factors, and family  history of disease. Counseling  Your health care provider may ask you questions about your: Alcohol use. Tobacco use. Drug use. Emotional well-being. Home and relationship well-being. Sexual activity. Eating habits. History of falls. Memory and ability to understand (cognition). Work and work Astronomer. Screening  You may have the following tests or measurements: Height, weight, and BMI. Blood pressure. Lipid and cholesterol levels. These may be checked every 5 years, or more frequently if you are over 45 years old. Skin check. Lung cancer screening. You may have this screening every year starting at age 72 if you have a 30-pack-year history of smoking and currently smoke or have quit within the past 15 years. Fecal occult blood test (FOBT) of the stool. You may have this test every year starting at age 60. Flexible sigmoidoscopy or colonoscopy. You may have a sigmoidoscopy every 5 years or a colonoscopy every 10 years starting at age 65. Prostate cancer screening. Recommendations will vary depending on your family history and other risks. Hepatitis C blood test. Hepatitis B blood test. Sexually transmitted disease (STD) testing. Diabetes screening. This is done by checking your blood sugar (glucose) after you have not eaten for a while (fasting). You may have this done every 1-3 years. Abdominal aortic aneurysm (AAA) screening. You may need this if you are a current or former smoker. Osteoporosis. You may be screened starting at age 86 if you are at high risk. Talk with your health care provider about your test results, treatment options, and if necessary, the need for more tests. Vaccines  Your health care provider may recommend certain vaccines, such as: Influenza vaccine. This is recommended every year. Tetanus, diphtheria, and acellular pertussis (Tdap, Td) vaccine. You may need a Td  booster every 10 years. Zoster vaccine. You may need this after age 52. Pneumococcal  13-valent conjugate (PCV13) vaccine. One dose is recommended after age 38. Pneumococcal polysaccharide (PPSV23) vaccine. One dose is recommended after age 38. Talk to your health care provider about which screenings and vaccines you need and how often you need them. This information is not intended to replace advice given to you by your health care provider. Make sure you discuss any questions you have with your health care provider. Document Released: 08/12/2015 Document Revised: 04/04/2016 Document Reviewed: 05/17/2015 Elsevier Interactive Patient Education  2017 [place] Prevention in the Home Falls can cause injuries. They can happen to people of all ages. There are many things you can do to make your home safe and to help prevent falls. What can I do on the outside of my home? Regularly fix the edges of walkways and driveways and fix any cracks. Remove anything that might make you trip as you walk through a door, such as a raised step or threshold. Trim any bushes or trees on the path to your home. Use bright outdoor lighting. Clear any walking paths of anything that might make someone trip, such as rocks or tools. Regularly check to see if handrails are loose or broken. Make sure that both sides of any steps have handrails. Any raised decks and porches should have guardrails on the edges. Have any leaves, snow, or ice cleared regularly. Use sand or salt on walking paths during winter. Clean up any spills in your garage right away. This includes oil or grease spills. What can I do in the bathroom? Use night lights. Install grab bars by the toilet and in the tub and shower. Do not use towel bars as grab bars. Use non-skid mats or decals in the tub or shower. If you need to sit down in the shower, use a plastic, non-slip stool. Keep the floor dry. Clean up any water that spills on the floor as soon as it happens. Remove soap buildup in the tub or shower regularly. Attach  bath mats securely with double-sided non-slip rug tape. Do not have throw rugs and other things on the floor that can make you trip. What can I do in the bedroom? Use night lights. Make sure that you have a light by your bed that is easy to reach. Do not use any sheets or blankets that are too big for your bed. They should not hang down onto the floor. Have a firm chair that has side arms. You can use this for support while you get dressed. Do not have throw rugs and other things on the floor that can make you trip. What can I do in the kitchen? Clean up any spills right away. Avoid walking on wet floors. Keep items that you use a lot in easy-to-reach places. If you need to reach something above you, use a strong step stool that has a grab bar. Keep electrical cords out of the way. Do not use floor polish or wax that makes floors slippery. If you must use wax, use non-skid floor wax. Do not have throw rugs and other things on the floor that can make you trip. What can I do with my stairs? Do not leave any items on the stairs. Make sure that there are handrails on both sides of the stairs and use them. Fix handrails that are broken or loose. Make sure that handrails are as long as the stairways. Check any carpeting  to make sure that it is firmly attached to the stairs. Fix any carpet that is loose or worn. Avoid having throw rugs at the top or bottom of the stairs. If you do have throw rugs, attach them to the floor with carpet tape. Make sure that you have a light switch at the top of the stairs and the bottom of the stairs. If you do not have them, ask someone to add them for you. What else can I do to help prevent falls? Wear shoes that: Do not have high heels. Have rubber bottoms. Are comfortable and fit you well. Are closed at the toe. Do not wear sandals. If you use a stepladder: Make sure that it is fully opened. Do not climb a closed stepladder. Make sure that both sides of the  stepladder are locked into place. Ask someone to hold it for you, if possible. Clearly mark and make sure that you can see: Any grab bars or handrails. First and last steps. Where the edge of each step is. Use tools that help you move around (mobility aids) if they are needed. These include: Canes. Walkers. Scooters. Crutches. Turn on the lights when you go into a dark area. Replace any light bulbs as soon as they burn out. Set up your furniture so you have a clear path. Avoid moving your furniture around. If any of your floors are uneven, fix them. If there are any pets around you, be aware of where they are. Review your medicines with your doctor. Some medicines can make you feel dizzy. This can increase your chance of falling. Ask your doctor what other things that you can do to help prevent falls. This information is not intended to replace advice given to you by your health care provider. Make sure you discuss any questions you have with your health care provider. Document Released: 05/12/2009 Document Revised: 12/22/2015 Document Reviewed: 08/20/2014 Elsevier Interactive Patient Education  2017 [employer].

## 2021-08-04 ENCOUNTER — Other Ambulatory Visit: Payer: Self-pay | Admitting: Family Medicine

## 2021-10-13 ENCOUNTER — Telehealth: Payer: Self-pay | Admitting: Family Medicine

## 2021-10-13 ENCOUNTER — Other Ambulatory Visit: Payer: Self-pay | Admitting: Family Medicine

## 2021-10-13 MED ORDER — ESCITALOPRAM OXALATE 10 MG PO TABS: 10.0000 mg | ORAL_TABLET | Freq: Every day | ORAL | 1 refills | Status: DC

## 2021-10-13 NOTE — Telephone Encounter (Signed)
Updated Rx sent

## 2021-10-13 NOTE — Telephone Encounter (Signed)
Pt called. He needs a refill on his Lexapro1 0mg (discussed with Dr. about dropping dose to 10 mg.) ?

## 2021-10-13 NOTE — Telephone Encounter (Signed)
Thank you.  Pt aware.

## 2021-12-06 ENCOUNTER — Other Ambulatory Visit: Payer: Self-pay | Admitting: Cardiothoracic Surgery

## 2021-12-06 DIAGNOSIS — I7121 Aneurysm of the ascending aorta, without rupture: Secondary | ICD-10-CM

## 2022-01-15 ENCOUNTER — Other Ambulatory Visit: Payer: Commercial Managed Care - PPO

## 2022-01-15 ENCOUNTER — Ambulatory Visit: Payer: Commercial Managed Care - PPO

## 2022-02-05 ENCOUNTER — Ambulatory Visit
Admission: RE | Admit: 2022-02-05 | Discharge: 2022-02-05 | Disposition: A | Discharge status: Home / Self Care | Payer: Commercial Managed Care - PPO | Source: Ambulatory Visit | Attending: Cardiothoracic Surgery | Admitting: Cardiothoracic Surgery

## 2022-02-05 DIAGNOSIS — I7121 Aneurysm of the ascending aorta, without rupture: Secondary | ICD-10-CM

## 2022-02-05 MED ORDER — IOPAMIDOL (ISOVUE-370) INJECTION 76%
75.0000 mL | Freq: Once | INTRAVENOUS | Status: AC | PRN
  Administered 2022-02-05: 75 mL via INTRAVENOUS

## 2022-02-05 NOTE — Progress Notes (Unsigned)
      [address]       [place] [address]             [phone]       HPI: Mr. Fred Bond is a 71yo male with past medical history notable for hypertension with retinopathy, dyslipidemia, asthma, major depression and idiopathic medial aortopathy. 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 He was last seen by Dr. Donata Clay in December of 2021 and returns after  having a chest CTA earlier today.     Current Outpatient Medications  Medication Sig Dispense Refill   ASPIRIN LOW DOSE 81 MG EC tablet Take 81 mg by mouth. After surgery only     escitalopram (LEXAPRO) 10 MG tablet Take 1 tablet (10 mg total) by mouth daily. 90 tablet 1   flintstones complete (FLINTSTONES) 60 MG chewable tablet Chew 1 tablet by mouth daily. With iron     L-Lysine 500 MG TABS Take 500 mg by mouth daily.      Multiple Vitamins-Minerals (CENTRUM SILVER ULTRA MENS) TABS Take 1 tablet by mouth daily.      niacin 500 MG tablet Take 500 mg by mouth daily with breakfast.     pravastatin (PRAVACHOL) 40 MG tablet Take 1 tablet (40 mg total) by mouth daily. 90 tablet 3   tadalafil (CIALIS) 20 MG tablet TAKE 1/2 TO 1 (ONE-HALF TO ONE) TABLET BY MOUTH EVERY OTHER DAY AS NEEDED FOR  ERECTILE  DYSFUNCTION 15 tablet 11   No current facility-administered medications for this visit.    Physical Exam: ***  Diagnostic Tests: ***  Impression: ***  Plan: ***   Leary Roca, PA-C [facility] [phone]

## 2022-02-06 ENCOUNTER — Ambulatory Visit (INDEPENDENT_AMBULATORY_CARE_PROVIDER_SITE_OTHER): Payer: Commercial Managed Care - PPO | Admitting: Physician Assistant

## 2022-02-06 ENCOUNTER — Encounter: Payer: Self-pay | Admitting: Physician Assistant

## 2022-02-06 VITALS — BP 137/69 | HR 67 | Resp 20 | Ht 71.0 in | Wt 204.0 lb

## 2022-02-06 DIAGNOSIS — I7121 Aneurysm of the ascending aorta, without rupture: Secondary | ICD-10-CM | POA: Diagnosis not present

## 2022-02-06 DIAGNOSIS — R911 Solitary pulmonary nodule: Secondary | ICD-10-CM | POA: Diagnosis not present

## 2022-02-06 NOTE — Patient Instructions (Signed)
Continue to maintain healthy blood pressure of less than 140/80.  Continue to manage her cholesterol and lipids with pravastatin.  Avoid strenuous activity  Follow-up in 3 months with noncontrast CT to reevaluate the left lower lobe pulmonary nodule  We will schedule follow-up in 18 months with CTA chest to reevaluate the ascending aortic aneurysm.

## 2022-04-02 ENCOUNTER — Other Ambulatory Visit: Payer: Self-pay | Admitting: Family Medicine

## 2022-04-04 ENCOUNTER — Other Ambulatory Visit: Payer: Self-pay | Admitting: Cardiothoracic Surgery

## 2022-04-04 DIAGNOSIS — R911 Solitary pulmonary nodule: Secondary | ICD-10-CM

## 2022-04-05 NOTE — Telephone Encounter (Signed)
Pls contact the patient to schedule appt. Not seen x 1 year. Sending 30 day refill. Thanks

## 2022-05-11 ENCOUNTER — Other Ambulatory Visit: Payer: Self-pay | Admitting: Family Medicine

## 2022-05-14 ENCOUNTER — Ambulatory Visit: Payer: Commercial Managed Care - PPO | Admitting: Cardiothoracic Surgery

## 2022-05-14 ENCOUNTER — Ambulatory Visit
Admission: RE | Admit: 2022-05-14 | Discharge: 2022-05-14 | Disposition: A | Discharge status: Home / Self Care | Payer: Commercial Managed Care - PPO | Source: Ambulatory Visit | Attending: Cardiothoracic Surgery | Admitting: Cardiothoracic Surgery

## 2022-05-14 DIAGNOSIS — R911 Solitary pulmonary nodule: Secondary | ICD-10-CM

## 2022-05-15 ENCOUNTER — Ambulatory Visit: Payer: Commercial Managed Care - PPO | Admitting: Cardiothoracic Surgery

## 2022-05-15 ENCOUNTER — Telehealth: Payer: Self-pay | Admitting: Cardiothoracic Surgery

## 2022-05-15 NOTE — Telephone Encounter (Signed)
[address]       [place] [address]             [phone]     CARDIOTHORACIC SURGERY TELEPHONE VIRTUAL OFFICE NOTE  Referring Provider is No ref. provider found Primary Cardiologist is None PCP is Luetta Nutting, DO   HPI:  I spoke with Fred Bond (DOB 03/08/1951 ) via telephone on 05/15/2022 at 1:59 PM and verified that I was speaking with the correct person using more than one form of identification.  We discussed the reason(s) for conducting our visit virtually instead of in-person.  The patient expressed understanding the circumstances and agreed to proceed as described.  I discussed the findings on CT scan of the chest performed May 14, 2022 the patient.  The CT study was in follow-up of a scan in July 2023 which showed a 1.2 cm groundglass density in the left lower lobe.  That density has now completely resolved.  Patient's mild-moderate fusiform ascending aneurysm remains unchanged at 4.3 cm diameter.  All of this information was conveyed to the patient understands that no further x-rays are needed for his lung and that we will continue to surveillance scan his thoracic aorta at the previously scheduled date 18 months after his scan in July 2023.    Current Outpatient Medications  Medication Sig Dispense Refill   escitalopram (LEXAPRO) 10 MG tablet Take 1 tablet (10 mg total) by mouth daily. Pt needs to keep appointment on 05/29/2022 14 tablet 0   ASPIRIN LOW DOSE 81 MG EC tablet Take 81 mg by mouth. After surgery only     flintstones complete (FLINTSTONES) 60 MG chewable tablet Chew 1 tablet by mouth daily. With iron     L-Lysine 500 MG TABS Take 500 mg by mouth daily.      Multiple Vitamins-Minerals (CENTRUM SILVER ULTRA MENS) TABS Take 1 tablet by mouth daily.      niacin 500 MG tablet Take 500 mg by mouth daily with breakfast.     pravastatin (PRAVACHOL) 40 MG tablet Take 1 tablet (40 mg total) by mouth daily. 90 tablet 3   tadalafil  (CIALIS) 20 MG tablet TAKE 1/2 TO 1 (ONE-HALF TO ONE) TABLET BY MOUTH EVERY OTHER DAY AS NEEDED FOR  ERECTILE  DYSFUNCTION 15 tablet 11   No current facility-administered medications for this visit.     Diagnostic Tests:  CT scan performed May 14, 2022 personally reviewed and results discussed with patient.  The left lower lobe groundglass density noted in July is now resolved.  The abnormality was probably an area of bronchitis or congestion   Impression:  Resolved left lower lobe groundglass density 1.2 cm noted in July 2023  Fusiform ascending aneurysm remains stable at 4.3 cm diameter  Plan:  No further scans for lung disease needed. Continue with surveillance scans of the thoracic aorta as previously arranged.    I discussed limitations of evaluation and management via telephone.  The patient was advised to call back for repeat telephone consultation or to seek an in-person evaluation if questions arise or the patient's clinical condition changes in any significant manner.  I spent in excess of 3 minutes of non-face-to-face time during the conduct of this telephone virtual office consultation.  Level 1  (99441)             5-10 minutes Level 2  (99442)            11-20 minutes Level 3  (  32671)            21-30 minutes    05/15/2022 1:59 PM

## 2022-05-29 ENCOUNTER — Ambulatory Visit (INDEPENDENT_AMBULATORY_CARE_PROVIDER_SITE_OTHER): Payer: Commercial Managed Care - PPO | Admitting: Family Medicine

## 2022-05-29 ENCOUNTER — Encounter: Payer: Self-pay | Admitting: Family Medicine

## 2022-05-29 VITALS — BP 155/77 | HR 53 | Ht 71.0 in | Wt 203.0 lb

## 2022-05-29 DIAGNOSIS — F320 Major depressive disorder, single episode, mild: Secondary | ICD-10-CM | POA: Diagnosis not present

## 2022-05-29 DIAGNOSIS — Z23 Encounter for immunization: Secondary | ICD-10-CM | POA: Diagnosis not present

## 2022-05-29 DIAGNOSIS — E785 Hyperlipidemia, unspecified: Secondary | ICD-10-CM | POA: Diagnosis not present

## 2022-05-29 DIAGNOSIS — R030 Elevated blood-pressure reading, without diagnosis of hypertension: Secondary | ICD-10-CM

## 2022-05-29 MED ORDER — BUPROPION HCL ER (XL) 150 MG PO TB24: 150.0000 mg | ORAL_TABLET | Freq: Every day | ORAL | 3 refills | Status: DC

## 2022-05-29 MED ORDER — ESCITALOPRAM OXALATE 5 MG PO TABS: 5.0000 mg | ORAL_TABLET | Freq: Every day | ORAL | 0 refills | Status: DC

## 2022-05-29 MED ORDER — TADALAFIL 20 MG PO TABS: ORAL_TABLET | ORAL | 11 refills | Status: DC

## 2022-05-29 NOTE — Patient Instructions (Addendum)
Taper from escitalopram (lexapro):  Decrease to 5mg  daily for 7 days then take 5mg  every other day for 14 days.   You can go ahead and start bupropion while you are tapering from lexapro.   See me again in 6 weeks.   DASH Eating Plan DASH stands for Dietary Approaches to Stop Hypertension. The DASH eating plan is a healthy eating plan that has been shown to: Reduce high blood pressure (hypertension). Reduce your risk for type 2 diabetes, heart disease, and stroke. Help with weight loss. What are tips for following this plan? Reading food labels Check food labels for the amount of salt (sodium) per serving. Choose foods with less than 5 percent of the Daily Value of sodium. Generally, foods with less than 300 milligrams (mg) of sodium per serving fit into this eating plan. To find whole grains, look for the word "whole" as the first word in the ingredient list. Shopping Buy products labeled as "low-sodium" or "no salt added." Buy fresh foods. Avoid canned foods and pre-made or frozen meals. Cooking Avoid adding salt when cooking. Use salt-free seasonings or herbs instead of table salt or sea salt. Check with your health care provider or pharmacist before using salt substitutes. Do not fry foods. Cook foods using healthy methods such as baking, boiling, grilling, roasting, and broiling instead. Cook with heart-healthy oils, such as olive, canola, avocado, soybean, or sunflower oil. Meal planning  Eat a balanced diet that includes: 4 or more servings of fruits and 4 or more servings of vegetables each day. Try to fill one-half of your plate with fruits and vegetables. 6-8 servings of whole grains each day. Less than 6 oz (170 g) of lean meat, poultry, or fish each day. A 3-oz (85-g) serving of meat is about the same size as a deck of cards. One egg equals 1 oz (28 g). 2-3 servings of low-fat dairy each day. One serving is 1 cup (237 mL). 1 serving of nuts, seeds, or beans 5 times each  week. 2-3 servings of heart-healthy fats. Healthy fats called omega-3 fatty acids are found in foods such as walnuts, flaxseeds, fortified milks, and eggs. These fats are also found in cold-water fish, such as sardines, salmon, and mackerel. Limit how much you eat of: Canned or prepackaged foods. Food that is high in trans fat, such as some fried foods. Food that is high in saturated fat, such as fatty meat. Desserts and other sweets, sugary drinks, and other foods with added sugar. Full-fat dairy products. Do not salt foods before eating. Do not eat more than 4 egg yolks a week. Try to eat at least 2 vegetarian meals a week. Eat more home-cooked food and less restaurant, buffet, and fast food. Lifestyle When eating at a restaurant, ask that your food be prepared with less salt or no salt, if possible. If you drink alcohol: Limit how much you use to: 0-1 drink a day for women who are not pregnant. 0-2 drinks a day for men. Be aware of how much alcohol is in your drink. In the [place] one drink equals one 12 oz bottle of beer (355 mL), one 5 oz glass of wine (148 mL), or one 1 oz glass of hard liquor (44 mL). General information Avoid eating more than 2,300 mg of salt a day. If you have hypertension, you may need to reduce your sodium intake to 1,500 mg a day. Work with your health care provider to maintain a healthy body weight or to lose  weight. Ask what an ideal weight is for you. Get at least 30 minutes of exercise that causes your heart to beat faster (aerobic exercise) most days of the week. Activities may include walking, swimming, or biking. Work with your health care provider or dietitian to adjust your eating plan to your individual calorie needs. What foods should I eat? Fruits All fresh, dried, or frozen fruit. Canned fruit in natural juice (without added sugar). Vegetables Fresh or frozen vegetables (raw, steamed, roasted, or grilled). Low-sodium or reduced-sodium tomato and  vegetable juice. Low-sodium or reduced-sodium tomato sauce and tomato paste. Low-sodium or reduced-sodium canned vegetables. Grains Whole-grain or whole-wheat bread. Whole-grain or whole-wheat pasta. Brown rice. [place]. Bulgur. Whole-grain and low-sodium cereals. Pita bread. Low-fat, low-sodium crackers. Whole-wheat flour tortillas. Meats and other proteins Skinless chicken or [place]. Ground chicken or [place]. Pork with fat trimmed off. Fish and seafood. Egg whites. Dried beans, peas, or lentils. Unsalted nuts, nut butters, and seeds. Unsalted canned beans. Lean cuts of beef with fat trimmed off. Low-sodium, lean precooked or cured meat, such as sausages or meat loaves. Dairy Low-fat (1%) or fat-free (skim) milk. Reduced-fat, low-fat, or fat-free cheeses. Nonfat, low-sodium ricotta or cottage cheese. Low-fat or nonfat yogurt. Low-fat, low-sodium cheese. Fats and oils Soft margarine without trans fats. Vegetable oil. Reduced-fat, low-fat, or light mayonnaise and salad dressings (reduced-sodium). Canola, safflower, olive, avocado, soybean, and sunflower oils. Avocado. Seasonings and condiments Herbs. Spices. Seasoning mixes without salt. Other foods Unsalted popcorn and pretzels. Fat-free sweets. The items listed above may not be a complete list of foods and beverages you can eat. Contact a dietitian for more information. What foods should I avoid? Fruits Canned fruit in a light or heavy syrup. Fried fruit. Fruit in cream or butter sauce. Vegetables Creamed or fried vegetables. Vegetables in a cheese sauce. Regular canned vegetables (not low-sodium or reduced-sodium). Regular canned tomato sauce and paste (not low-sodium or reduced-sodium). Regular tomato and vegetable juice (not low-sodium or reduced-sodium). Angie Fava. Olives. Grains Baked goods made with fat, such as croissants, muffins, or some breads. Dry pasta or rice meal packs. Meats and other proteins Fatty cuts of meat. Ribs.  Fried meat. Berniece Salines. Bologna, salami, and other precooked or cured meats, such as sausages or meat loaves. Fat from the back of a pig (fatback). Bratwurst. Salted nuts and seeds. Canned beans with added salt. Canned or smoked fish. Whole eggs or egg yolks. Chicken or [place] with skin. Dairy Whole or 2% milk, cream, and half-and-half. Whole or full-fat cream cheese. Whole-fat or sweetened yogurt. Full-fat cheese. Nondairy creamers. Whipped toppings. Processed cheese and cheese spreads. Fats and oils Butter. Stick margarine. Lard. Shortening. Ghee. Bacon fat. Tropical oils, such as coconut, palm kernel, or palm oil. Seasonings and condiments Onion salt, garlic salt, seasoned salt, table salt, and sea salt. Worcestershire sauce. Tartar sauce. Barbecue sauce. Teriyaki sauce. Soy sauce, including reduced-sodium. Steak sauce. Canned and packaged gravies. Fish sauce. Oyster sauce. Cocktail sauce. Store-bought horseradish. Ketchup. Mustard. Meat flavorings and tenderizers. Bouillon cubes. Hot sauces. Pre-made or packaged marinades. Pre-made or packaged taco seasonings. Relishes. Regular salad dressings. Other foods Salted popcorn and pretzels. The items listed above may not be a complete list of foods and beverages you should avoid. Contact a dietitian for more information. Where to find more information National Heart, Lung, and Blood Institute: https://wilson-eaton.com/ American Heart Association: www.heart.org Academy of Nutrition and Dietetics: www.eatright.[place]: www.kidney.org Summary The DASH eating plan is a healthy eating plan that has been shown  to reduce high blood pressure (hypertension). It may also reduce your risk for type 2 diabetes, heart disease, and stroke. When on the DASH eating plan, aim to eat more fresh fruits and vegetables, whole grains, lean proteins, low-fat dairy, and heart-healthy fats. With the DASH eating plan, you should limit salt (sodium) intake to 2,300 mg  a day. If you have hypertension, you may need to reduce your sodium intake to 1,500 mg a day. Work with your health care provider or dietitian to adjust your eating plan to your individual calorie needs. This information is not intended to replace advice given to you by your health care provider. Make sure you discuss any questions you have with your health care provider. Document Revised: 06/19/2019 Document Reviewed: 06/19/2019 Elsevier Patient Education  2023 [employer].

## 2022-06-03 DIAGNOSIS — R030 Elevated blood-pressure reading, without diagnosis of hypertension: Secondary | ICD-10-CM | POA: Insufficient documentation

## 2022-06-03 NOTE — Progress Notes (Signed)
Fred Bond - 71 y.o. male MRN [id number]  Date of birth: [birth year]  Subjective Chief Complaint  Patient presents with   Hypertension    HPI Fred Bond is a 71 year old male here today for follow-up visit.  He reports he is doing well at this time.  He does not feel that Lexapro is working as well as he would like for him to.  He has not noted any significant side effects from this.  He would like to try something a little different.  He reports that the majority of his symptoms are more depression related.  He feels decreased motivation with increased fatigue.    Blood pressure is elevated.  He thinks this may be related to stress.  He is not currently on any medications for management of hypertension.  He denies any symptoms leading to hypertension including chest pain, shortness of breath, palpitations, headaches or vision changes.  He is requesting renewal on tadalafil.  This has worked pretty well for him.  ROS:  A comprehensive ROS was completed and negative except as noted per HPI   No Known Allergies  Past Medical History:  Diagnosis Date   Asthma 08/07/2012   Childhood    Depression    Headache    History of iron deficiency 08/07/2012   Hypercholesterolemia    Hypertensive retinopathy of both eyes 03/20/2017   Seen on eye exam August 2018   Mononucleosis    at age 65   OA (osteoarthritis) of knee    left   Recurrent pneumonia 08/07/2012   Tear of meniscus of left knee 08/07/2012    Past Surgical History:  Procedure Laterality Date   COLONOSCOPY W/ BIOPSIES AND POLYPECTOMY     EYE SURGERY     KNEE ARTHROSCOPY     left 2014   TONSILLECTOMY     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   Procedure: TOTAL KNEE ARTHROPLASTY;  Surgeon: Gean Birchwood, MD;  Location: [facility];  Service: Orthopedics;  Laterality: Left;   WISDOM TOOTH EXTRACTION      Social History   Socioeconomic History   Marital status: Married    Spouse name:  Not on file   Number of children: Not on file   Years of education: Not on file   Highest education level: Not on file  Occupational History   Not on file  Tobacco Use   Smoking status: Never   Smokeless tobacco: Never  Vaping Use   Vaping Use: Never used  Substance and Sexual Activity   Alcohol use: No   Drug use: No   Sexual activity: Not on file  Other Topics Concern   Not on file  Social History Narrative   Not on file   Social Determinants of Health   Financial Resource Strain: Low Risk  (04/17/2021)   Overall Financial Resource Strain (CARDIA)    Difficulty of Paying Living Expenses: Not hard at all  Food Insecurity: No Food Insecurity (04/17/2021)   Hunger Vital Sign    Worried About Running Out of Food in the Last Year: Never true    Ran Out of Food in the Last Year: Never true  Transportation Needs: No Transportation Needs (04/17/2021)   PRAPARE - Administrator, Civil Service (Medical): No    Lack of Transportation (Non-Medical): No  Physical Activity: Sufficiently Active (04/17/2021)   Exercise Vital Sign    Days of Exercise per Week: 5 days    Minutes of  Exercise per Session: 30 min  Stress: No Stress Concern Present (04/17/2021)   [name] of Occupational Health - Occupational Stress Questionnaire    Feeling of Stress : Not at all  Social Connections: Moderately Integrated (04/17/2021)   Social Connection and Isolation Panel [NHANES]    Frequency of Communication with Friends and Family: More than three times a week    Frequency of Social Gatherings with Friends and Family: More than three times a week    Attends Religious Services: 1 to 4 times per year    Active Member of [employer] or Organizations: No    Attends Banker Meetings: Never    Marital Status: Married    Family History  Problem Relation Age of Onset   Alcohol abuse Unknown        grandfather   Heart attack Unknown        grandparents   Cancer Mother    Melanoma  Mother    Melanoma Father     Health Maintenance  Topic Date Due   Zoster Vaccines- Shingrix (1 of 2) 08/29/2022 (Originally 12/26/2002)   COVID-19 Vaccine (3 - Pfizer series) 08/30/2022 (Originally 12/03/2019)   Medicare Annual Wellness (AWV)  08/30/2022 (Originally 04/17/2022)   TETANUS/TDAP  03/30/2024   COLONOSCOPY (Pts 45-33yrs Insurance coverage will need to be confirmed)  06/29/2025   Pneumonia Vaccine 51+ Years old  Completed   INFLUENZA VACCINE  Completed   Hepatitis C Screening  Completed   HPV VACCINES  Aged Out     ----------------------------------------------------------------------------------------------------------------------------------------------------------------------------------------------------------------- Physical Exam BP (!) 155/77 (BP Location: Left Arm, Patient Position: Sitting, Cuff Size: Normal)   Pulse (!) 53   Ht 5\' 11"  (1.803 m)   Wt 203 lb (92.1 kg)   SpO2 97%   BMI 28.31 kg/m   Physical Exam Constitutional:      Appearance: Normal appearance.  HENT:     Head: Normocephalic and atraumatic.  Cardiovascular:     Rate and Rhythm: Normal rate and regular rhythm.  Pulmonary:     Effort: Pulmonary effort is normal.     Breath sounds: Normal breath sounds.  Musculoskeletal:     Cervical back: Neck supple.  Neurological:     Mental Status: He is alert.  Psychiatric:        Mood and Affect: Mood normal.        Behavior: Behavior normal.     ------------------------------------------------------------------------------------------------------------------------------------------------------------------------------------------------------------------- Assessment and Plan  Elevated blood pressure reading Recommend that he monitor his blood pressure over the next few weeks.  We will plan to see him back for follow-up in about 4 weeks to recheck his blood pressure.  Hyperlipidemia with target low density lipoprotein (LDL) cholesterol less than  130 mg/dL Lab Results  Component Value Date   LDLCALC 90 08/02/2020  Continue pravastatin at current strength.  Depression, major, single episode, mild (HCC) Multiple does not seem to be working very well for him.  Meaningfulness and adding bupropion.  Return in about 6 weeks (around 07/10/2022) for Mood/BP.   Meds ordered this encounter  Medications   tadalafil (CIALIS) 20 MG tablet    Sig: TAKE 1/2 TO 1 (ONE-HALF TO ONE) TABLET BY MOUTH EVERY OTHER DAY AS NEEDED FOR  ERECTILE  DYSFUNCTION    Dispense:  15 tablet    Refill:  11   escitalopram (LEXAPRO) 5 MG tablet    Sig: Take 1 tablet (5 mg total) by mouth daily.    Dispense:  30 tablet    Refill:  0   buPROPion (WELLBUTRIN XL) 150 MG 24 hr tablet    Sig: Take 1 tablet (150 mg total) by mouth daily.    Dispense:  30 tablet    Refill:  3    Return in about 6 weeks (around 07/10/2022) for Mood/BP.    This visit occurred during the SARS-CoV-2 public health emergency.  Safety protocols were in place, including screening questions prior to the visit, additional usage of staff PPE, and extensive cleaning of exam room while observing appropriate contact time as indicated for disinfecting solutions.

## 2022-06-03 NOTE — Assessment & Plan Note (Signed)
Lab Results  Component Value Date   LDLCALC 90 08/02/2020  Continue pravastatin at current strength.

## 2022-06-03 NOTE — Assessment & Plan Note (Signed)
Recommend that he monitor his blood pressure over the next few weeks.  We will plan to see him back for follow-up in about 4 weeks to recheck his blood pressure.

## 2022-06-03 NOTE — Assessment & Plan Note (Signed)
Multiple does not seem to be working very well for him.  Meaningfulness and adding bupropion.  Return in about 6 weeks (around 07/10/2022) for Mood/BP.

## 2022-07-10 ENCOUNTER — Ambulatory Visit: Payer: Commercial Managed Care - PPO | Admitting: Family Medicine

## 2022-07-17 ENCOUNTER — Encounter: Payer: Self-pay | Admitting: Family Medicine

## 2022-07-17 ENCOUNTER — Ambulatory Visit (INDEPENDENT_AMBULATORY_CARE_PROVIDER_SITE_OTHER): Payer: Commercial Managed Care - PPO | Admitting: Family Medicine

## 2022-07-17 VITALS — BP 136/64 | HR 58 | Ht 71.0 in | Wt 196.0 lb

## 2022-07-17 DIAGNOSIS — E785 Hyperlipidemia, unspecified: Secondary | ICD-10-CM

## 2022-07-17 DIAGNOSIS — Z125 Encounter for screening for malignant neoplasm of prostate: Secondary | ICD-10-CM

## 2022-07-17 DIAGNOSIS — Z23 Encounter for immunization: Secondary | ICD-10-CM | POA: Diagnosis not present

## 2022-07-17 DIAGNOSIS — F320 Major depressive disorder, single episode, mild: Secondary | ICD-10-CM

## 2022-07-17 DIAGNOSIS — Z8639 Personal history of other endocrine, nutritional and metabolic disease: Secondary | ICD-10-CM | POA: Diagnosis not present

## 2022-07-17 NOTE — Assessment & Plan Note (Signed)
Tolerating pravastatin well at current strength.  Updating lipid panel and metabolic panel.

## 2022-07-17 NOTE — Assessment & Plan Note (Signed)
Reports he feels better with bupropion.  Will continue this at current strength.

## 2022-07-17 NOTE — Progress Notes (Signed)
Fred Bond - 71 y.o. male MRN [id number]  Date of birth: [birth year]  Subjective Chief Complaint  Patient presents with   Hypertension    HPI Fred Bond is a 71 y.o. male here today for follow-up visit.  He reports he is doing well at this time.  Bupropion is working well for him.  Feels less depressed with better energy level since adding this.  Slightly more stress related to his job.  He works in Scient[employer], research (medical) and noted some increase stress during the holiday season.      07/17/2022    4:15 PM 05/29/2022    4:15 PM 04/17/2021    5:50 PM  Depression screen PHQ 2/9  Decreased Interest 0 0 0  Down, Depressed, Hopeless 1 1 0  PHQ - 2 Score 1 1 0  Altered sleeping 1 0   Tired, decreased energy 1 0   Change in appetite 0 0   Feeling bad or failure about yourself  0 0   Trouble concentrating 1 0   Moving slowly or fidgety/restless 0 0   Suicidal thoughts 0 0   PHQ-9 Score 4 1   Difficult doing work/chores Not difficult at all Not difficult at all       07/17/2022    4:16 PM 07/05/2020   10:41 AM 05/13/2018    2:29 PM 09/11/2017    3:15 PM  GAD 7 : Generalized Anxiety Score  Nervous, Anxious, on Edge 1 0 0 1  Control/stop worrying 0 1 0 0  Worry too much - different things 0 0 0 1  Trouble relaxing 0 0 0 1  Restless 0 1 0 0  Easily annoyed or irritable 1 1  0  Afraid - awful might happen 0 0 0 0  Total GAD 7 Score 2 3  3   Anxiety Difficulty Not difficult at all Not difficult at all Not difficult at all    ROS:  A comprehensive ROS was completed and negative except as noted per HPI   No Known Allergies  Past Medical History:  Diagnosis Date   Asthma 08/07/2012   Childhood    Depression    Headache    History of iron deficiency 08/07/2012   Hypercholesterolemia    Hypertensive retinopathy of both eyes 03/20/2017   Seen on eye exam August 2018   Mononucleosis    at age 54   OA (osteoarthritis) of knee    left   Recurrent pneumonia 08/07/2012   Tear of meniscus  of left knee 08/07/2012    Past Surgical History:  Procedure Laterality Date   COLONOSCOPY W/ BIOPSIES AND POLYPECTOMY     EYE SURGERY     KNEE ARTHROSCOPY     left 2014   TONSILLECTOMY     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   Procedure: TOTAL KNEE ARTHROPLASTY;  Surgeon: Frederik Pear, MD;  Location: [place];  Service: Orthopedics;  Laterality: Left;   WISDOM TOOTH EXTRACTION      Social History   Socioeconomic History   Marital status: Married    Spouse name: Not on file   Number of children: Not on file   Years of education: Not on file   Highest education level: Not on file  Occupational History   Not on file  Tobacco Use   Smoking status: Never   Smokeless tobacco: Never  Vaping Use   Vaping Use: Never used  Substance and Sexual Activity   Alcohol use:  No   Drug use: No   Sexual activity: Not on file  Other Topics Concern   Not on file  Social History Narrative   Not on file   Social Determinants of Health   Financial Resource Strain: Low Risk  (04/17/2021)   Overall Financial Resource Strain (CARDIA)    Difficulty of Paying Living Expenses: Not hard at all  Food Insecurity: No Food Insecurity (04/17/2021)   Hunger Vital Sign    Worried About Running Out of Food in the Last Year: Never true    Ran Out of Food in the Last Year: Never true  Transportation Needs: No Transportation Needs (04/17/2021)   PRAPARE - Administrator, Civil Service (Medical): No    Lack of Transportation (Non-Medical): No  Physical Activity: Sufficiently Active (04/17/2021)   Exercise Vital Sign    Days of Exercise per Week: 5 days    Minutes of Exercise per Session: 30 min  Stress: No Stress Concern Present (04/17/2021)   [name] of Occupational Health - Occupational Stress Questionnaire    Feeling of Stress : Not at all  Social Connections: Moderately Integrated (04/17/2021)   Social Connection and Isolation Panel  [NHANES]    Frequency of Communication with Friends and Family: More than three times a week    Frequency of Social Gatherings with Friends and Family: More than three times a week    Attends Religious Services: 1 to 4 times per year    Active Member of [employer] or Organizations: No    Attends Banker Meetings: Never    Marital Status: Married    Family History  Problem Relation Age of Onset   Alcohol abuse Unknown        grandfather   Heart attack Unknown        grandparents   Cancer Mother    Melanoma Mother    Melanoma Father     Health Maintenance  Topic Date Due   Zoster Vaccines- Shingrix (1 of 2) 08/29/2022 (Originally 12/26/2002)   DTaP/Tdap/Td (3 - Td or Tdap) 03/30/2024   COLONOSCOPY (Pts 45-1yrs Insurance coverage will need to be confirmed)  06/29/2025   Pneumonia Vaccine 33+ Years old  Completed   INFLUENZA VACCINE  Completed   COVID-19 Vaccine  Completed   Hepatitis C Screening  Completed   HPV VACCINES  Aged Out     ----------------------------------------------------------------------------------------------------------------------------------------------------------------------------------------------------------------- Physical Exam BP 136/64 (BP Location: Left Arm, Patient Position: Sitting, Cuff Size: Normal)   Pulse (!) 58   Ht 5\' 11"  (1.803 m)   Wt 196 lb (88.9 kg)   SpO2 96%   BMI 27.34 kg/m   Physical Exam  ------------------------------------------------------------------------------------------------------------------------------------------------------------------------------------------------------------------- Assessment and Plan  Depression, major, single episode, mild (HCC) Reports he feels better with bupropion.  Will continue this at current strength.  Hyperlipidemia with target low density lipoprotein (LDL) cholesterol less than 130 mg/dL Tolerating pravastatin well at current strength.  Updating lipid panel and metabolic  panel.   No orders of the defined types were placed in this encounter.   Return in about 6 months (around 01/16/2023) for Annual exam.    This visit occurred during the SARS-CoV-2 public health emergency.  Safety protocols were in place, including screening questions prior to the visit, additional usage of staff PPE, and extensive cleaning of exam room while observing appropriate contact time as indicated for disinfecting solutions.

## 2022-08-28 ENCOUNTER — Other Ambulatory Visit: Payer: Self-pay | Admitting: Family Medicine

## 2022-09-16 ENCOUNTER — Other Ambulatory Visit: Payer: Self-pay | Admitting: Family Medicine

## 2022-09-22 ENCOUNTER — Other Ambulatory Visit: Payer: Self-pay | Admitting: Family Medicine

## 2022-09-25 ENCOUNTER — Ambulatory Visit (INDEPENDENT_AMBULATORY_CARE_PROVIDER_SITE_OTHER): Payer: Commercial Managed Care - PPO | Admitting: Family Medicine

## 2022-09-25 ENCOUNTER — Encounter: Payer: Self-pay | Admitting: Family Medicine

## 2022-09-25 VITALS — BP 149/78 | HR 67 | Ht 71.0 in | Wt 198.0 lb

## 2022-09-25 DIAGNOSIS — R109 Unspecified abdominal pain: Secondary | ICD-10-CM | POA: Diagnosis not present

## 2022-09-25 DIAGNOSIS — R1032 Left lower quadrant pain: Secondary | ICD-10-CM

## 2022-09-25 LAB — POCT URINALYSIS DIP (CLINITEK)
Bilirubin, UA: NEGATIVE
Blood, UA: NEGATIVE
Glucose, UA: NEGATIVE mg/dL
Ketones, POC UA: NEGATIVE mg/dL
Leukocytes, UA: NEGATIVE
Nitrite, UA: NEGATIVE
POC PROTEIN,UA: NEGATIVE
Spec Grav, UA: 1.015 (ref 1.010–1.025)
Urobilinogen, UA: 0.2 E.U./dL
pH, UA: 7.5 (ref 5.0–8.0)

## 2022-09-25 NOTE — Assessment & Plan Note (Signed)
Noted to have left-sided colonic diverticulosis on previous CT of the chest.  Diverticulitis would be at the top of the differential based on his symptoms and exam.  CT of the abdomen pelvis ordered for further evaluation.  He will have labs completed that were ordered from a few months ago.  These will include CMP and CBC.

## 2022-09-25 NOTE — Patient Instructions (Signed)
Have labs completed.  We'll get a CT scan set up .

## 2022-09-25 NOTE — Progress Notes (Signed)
Fred Bond - 72 y.o. male MRN [id number]  Date of birth: 1950/08/30  Subjective Chief Complaint  Patient presents with   Abdominal Pain    HPI Fred Bond is a 72 year old male here today with complaint of abdominal pain.  He has had fairly consistent abdominal pain for approximately 2 weeks.  Pain started in the left lower quadrant but has migrated to just under the left rib cage.  Pain varies in intensity from mild to moderate to very severe at times.  He has had some decrease in appetite as well as nausea.  Bowels have been moving regularly.  He has not seen any blood in his stool or dark tarry stools.  Denies fever or chills.  Weight has been stable.  He is up-to-date on colon cancer screening.  ROS:  A comprehensive ROS was completed and negative except as noted per HPI  No Known Allergies  Past Medical History:  Diagnosis Date   Asthma 08/07/2012   Childhood    Depression    Headache    History of iron deficiency 08/07/2012   Hypercholesterolemia    Hypertensive retinopathy of both eyes 03/20/2017   Seen on eye exam August 2018   Mononucleosis    at age 78   OA (osteoarthritis) of knee    left   Recurrent pneumonia 08/07/2012   Tear of meniscus of left knee 08/07/2012    Past Surgical History:  Procedure Laterality Date   COLONOSCOPY W/ BIOPSIES AND POLYPECTOMY     EYE SURGERY     KNEE ARTHROSCOPY     left 2014   TONSILLECTOMY     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   Procedure: TOTAL KNEE ARTHROPLASTY;  Surgeon: Frederik Pear, MD;  Location: [facility];  Service: Orthopedics;  Laterality: Left;   WISDOM TOOTH EXTRACTION      Social History   Socioeconomic History   Marital status: Married    Spouse name: Not on file   Number of children: Not on file   Years of education: Not on file   Highest education level: Not on file  Occupational History   Not on file  Tobacco Use   Smoking status: Never   Smokeless tobacco: Never   Vaping Use   Vaping Use: Never used  Substance and Sexual Activity   Alcohol use: No   Drug use: No   Sexual activity: Not on file  Other Topics Concern   Not on file  Social History Narrative   Not on file   Social Determinants of Health   Financial Resource Strain: Low Risk  (04/17/2021)   Overall Financial Resource Strain (CARDIA)    Difficulty of Paying Living Expenses: Not hard at all  Food Insecurity: No Food Insecurity (04/17/2021)   Hunger Vital Sign    Worried About Running Out of Food in the Last Year: Never true    [place] in the Last Year: Never true  Transportation Needs: No Transportation Needs (04/17/2021)   PRAPARE - Hydrologist (Medical): No    Lack of Transportation (Non-Medical): No  Physical Activity: Sufficiently Active (04/17/2021)   Exercise Vital Sign    Days of Exercise per Week: 5 days    Minutes of Exercise per Session: 30 min  Stress: No Stress Concern Present (04/17/2021)   [place]    Feeling of Stress : Not at all  Social Connections:  Moderately Integrated (04/17/2021)   Social Connection and Isolation Panel [NHANES]    Frequency of Communication with Friends and Family: More than three times a week    Frequency of Social Gatherings with Friends and Family: More than three times a week    Attends Religious Services: 1 to 4 times per year    Active Member of [employer] or Organizations: No    Attends Archivist Meetings: Never    Marital Status: Married    Family History  Problem Relation Age of Onset   Alcohol abuse Unknown        grandfather   Heart attack Unknown        grandparents   Cancer Mother    Melanoma Mother    Melanoma Father     Health Maintenance  Topic Date Due   Zoster Vaccines- Shingrix (1 of 2) 12/24/2023 (Originally 12/26/2002)   DTaP/Tdap/Td (3 - Td or Tdap) 03/30/2024   COLONOSCOPY (Pts 45-75yr Insurance  coverage will need to be confirmed)  06/29/2025   Pneumonia Vaccine 70 Years old  Completed   INFLUENZA VACCINE  Completed   COVID-19 Vaccine  Completed   Hepatitis C Screening  Completed   HPV VACCINES  Aged Out     ----------------------------------------------------------------------------------------------------------------------------------------------------------------------------------------------------------------- Physical Exam BP (!) 149/78 (BP Location: Left Arm, Patient Position: Sitting, Cuff Size: Large)   Pulse 67   Ht '5\' 11"'$  (1.803 m)   Wt 198 lb (89.8 kg)   SpO2 98%   BMI 27.62 kg/m   Physical Exam Constitutional:      Appearance: He is well-developed.  HENT:     Head: Normocephalic and atraumatic.  Eyes:     General: No scleral icterus. Cardiovascular:     Rate and Rhythm: Normal rate and regular rhythm.  Pulmonary:     Effort: Pulmonary effort is normal.     Breath sounds: Normal breath sounds.  Abdominal:     General: Abdomen is flat. There is no distension.     Palpations: Abdomen is soft.     Tenderness: There is abdominal tenderness (Left lower quadrant tenderness as well as some tenderness to palpation in under the rib cage.  No palpable splenomegaly.).  Neurological:     General: No focal deficit present.     Mental Status: He is alert.  Psychiatric:        Mood and Affect: Mood normal.        Behavior: Behavior normal.     ------------------------------------------------------------------------------------------------------------------------------------------------------------------------------------------------------------------- Assessment and Plan  Left sided abdominal pain Noted to have left-sided colonic diverticulosis on previous CT of the chest.  Diverticulitis would be at the top of the differential based on his symptoms and exam.  CT of the abdomen pelvis ordered for further evaluation.  He will have labs completed that were ordered  from a few months ago.  These will include CMP and CBC.   No orders of the defined types were placed in this encounter.   No follow-ups on file.    This visit occurred during the SARS-CoV-2 public health emergency.  Safety protocols were in place, including screening questions prior to the visit, additional usage of staff PPE, and extensive cleaning of exam room while observing appropriate contact time as indicated for disinfecting solutions.

## 2022-09-28 LAB — CBC WITH DIFFERENTIAL/PLATELET
Absolute Monocytes: 897 cells/uL (ref 200–950)
Basophils Absolute: 72 cells/uL (ref 0–200)
Basophils Relative: 1.1 %
Eosinophils Absolute: 163 cells/uL (ref 15–500)
Eosinophils Relative: 2.5 %
HCT: 37.8 % — ABNORMAL LOW (ref 38.5–50.0)
Hemoglobin: 12.6 g/dL — ABNORMAL LOW (ref 13.2–17.1)
Lymphs Abs: 1606 cells/uL (ref 850–3900)
MCH: 32 pg (ref 27.0–33.0)
MCHC: 33.3 g/dL (ref 32.0–36.0)
MCV: 95.9 fL (ref 80.0–100.0)
MPV: 9.1 fL (ref 7.5–12.5)
Monocytes Relative: 13.8 %
Neutro Abs: 3764 cells/uL (ref 1500–7800)
Neutrophils Relative %: 57.9 %
Platelets: 366 10*3/uL (ref 140–400)
RBC: 3.94 10*6/uL — ABNORMAL LOW (ref 4.20–5.80)
RDW: 12.6 % (ref 11.0–15.0)
Total Lymphocyte: 24.7 %
WBC: 6.5 10*3/uL (ref 3.8–10.8)

## 2022-09-28 LAB — COMPLETE METABOLIC PANEL WITH GFR
AG Ratio: 1.3 (calc) (ref 1.0–2.5)
ALT: 22 U/L (ref 9–46)
AST: 32 U/L (ref 10–35)
Albumin: 4.1 g/dL (ref 3.6–5.1)
Alkaline phosphatase (APISO): 75 U/L (ref 35–144)
BUN: 16 mg/dL (ref 7–25)
CO2: 23 mmol/L (ref 20–32)
Calcium: 9.4 mg/dL (ref 8.6–10.3)
Chloride: 104 mmol/L (ref 98–110)
Creat: 1.01 mg/dL (ref 0.70–1.35)
Globulin: 3.2 g/dL (calc) (ref 1.9–3.7)
Glucose, Bld: 84 mg/dL (ref 65–99)
Potassium: 4.4 mmol/L (ref 3.5–5.3)
Sodium: 138 mmol/L (ref 135–146)
Total Bilirubin: 0.5 mg/dL (ref 0.2–1.2)
Total Protein: 7.3 g/dL (ref 6.1–8.1)
eGFR: 81 mL/min/{1.73_m2} (ref >=60)

## 2022-09-28 LAB — IRON,TIBC AND FERRITIN PANEL
%SAT: 10 % (calc) — ABNORMAL LOW (ref 20–48)
Ferritin: 10 ng/mL — ABNORMAL LOW (ref 24–380)
Iron: 43 ug/dL — ABNORMAL LOW (ref 50–180)
TIBC: 421 mcg/dL (calc) (ref 250–425)

## 2022-09-28 LAB — LIPID PANEL W/REFLEX DIRECT LDL
Cholesterol: 160 mg/dL (ref <200)
HDL: 70 mg/dL (ref >=40)
LDL Cholesterol (Calc): 76 mg/dL (calc)
Non-HDL Cholesterol (Calc): 90 mg/dL (calc) (ref <130)
Total CHOL/HDL Ratio: 2.3 (calc) (ref <5.0)
Triglycerides: 63 mg/dL (ref <150)

## 2022-09-28 LAB — PSA: PSA: 2.88 ng/mL (ref <=4.00)

## 2022-09-28 LAB — TSH: TSH: 1.11 mIU/L (ref 0.40–4.50)

## 2022-10-01 ENCOUNTER — Ambulatory Visit (INDEPENDENT_AMBULATORY_CARE_PROVIDER_SITE_OTHER): Payer: Commercial Managed Care - PPO

## 2022-10-01 DIAGNOSIS — R109 Unspecified abdominal pain: Secondary | ICD-10-CM

## 2022-10-01 DIAGNOSIS — R1032 Left lower quadrant pain: Secondary | ICD-10-CM

## 2022-10-01 MED ORDER — IOHEXOL 300 MG/ML  SOLN
100.0000 mL | Freq: Once | INTRAMUSCULAR | Status: AC | PRN
  Administered 2022-10-01: 100 mL via INTRAVENOUS

## 2022-10-01 MED ORDER — IOHEXOL 9 MG/ML PO SOLN: 500.0000 mL | ORAL | Status: AC

## 2022-10-04 ENCOUNTER — Other Ambulatory Visit: Payer: Self-pay | Admitting: Family Medicine

## 2022-10-04 MED ORDER — PANTOPRAZOLE SODIUM 40 MG PO TBEC: 40.0000 mg | DELAYED_RELEASE_TABLET | Freq: Every day | ORAL | 3 refills | Status: DC

## 2022-10-04 MED ORDER — AMOXICILLIN-POT CLAVULANATE 875-125 MG PO TABS: 1.0000 | ORAL_TABLET | Freq: Two times a day (BID) | ORAL | 0 refills | Status: DC

## 2022-10-19 ENCOUNTER — Other Ambulatory Visit: Payer: Self-pay | Admitting: Family Medicine

## 2022-11-03 IMAGING — CT CT ANGIO CHEST
2 series · 18 of 32 positions shown · IV contrast (APPLIED)
Comparison: 06/03/2019

CLINICAL DATA: Thoracic aortic aneurysm follow-up in this
69-year-old male

EXAM:
CT ANGIOGRAPHY CHEST WITH CONTRAST
TECHNIQUE: Multidetector CT imaging of the chest was performed using the
standard protocol during bolus administration of intravenous
contrast. Multiplanar CT image reconstructions and MIPs were
obtained to evaluate the vascular anatomy.
CONTRAST:  75mL 98WFP8-Y02 IOPAMIDOL ([id number]) INJECTION 76%
Creatinine was obtained on site at [HOSPITAL] at [HOSPITAL].
Results: Creatinine 0.9 mg/dL.

[Series 4: chest angio · axial · 0.83mm/px · z∈[-22,+266]mm · 11 of 116 slices shown]
[im 10/116  lung]
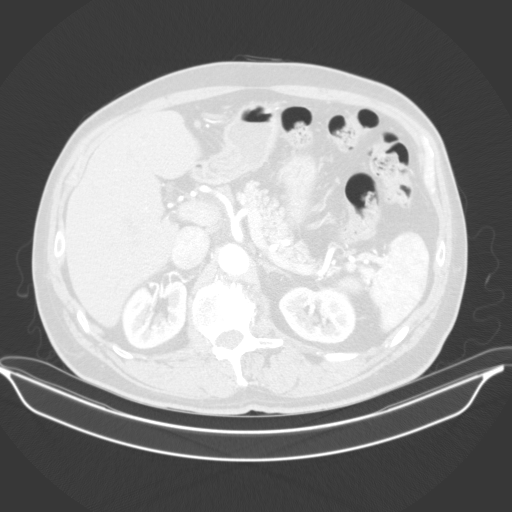
[im 20/116  soft-tissue]
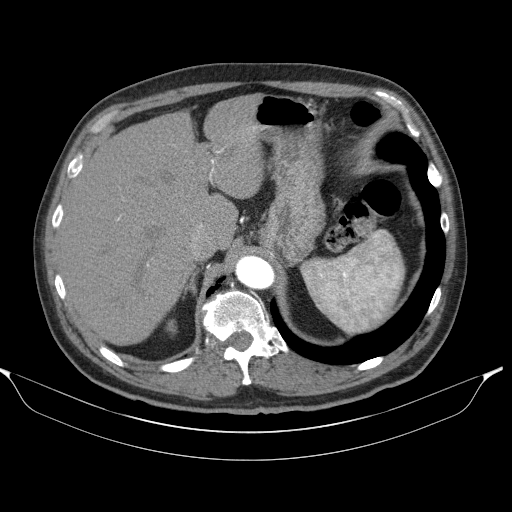
[im 29/116  lung]
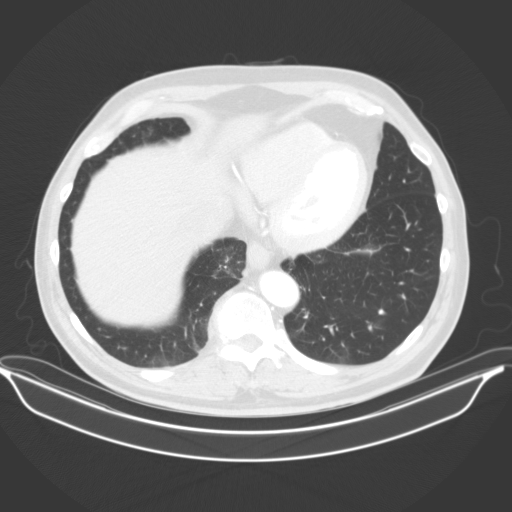
[im 39/116  soft-tissue]
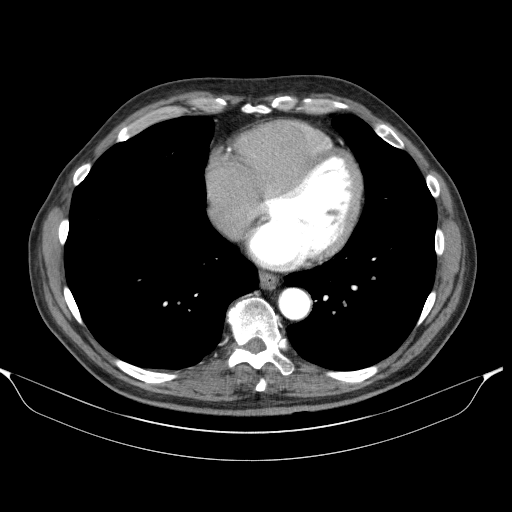
[im 48/116  lung]
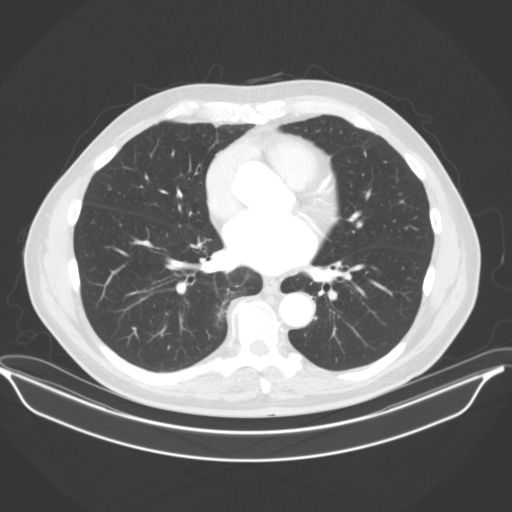
[im 58/116  soft-tissue]
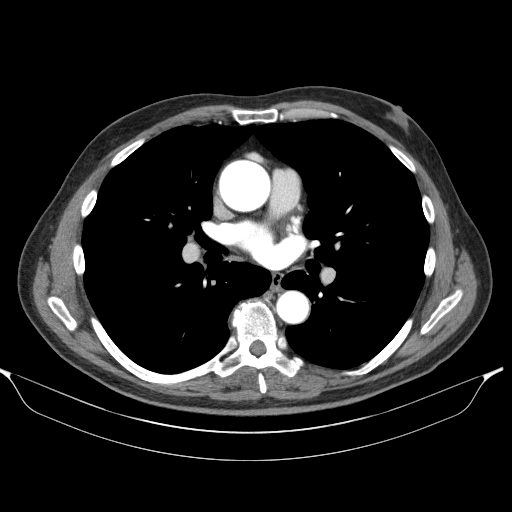
[im 68/116  lung]
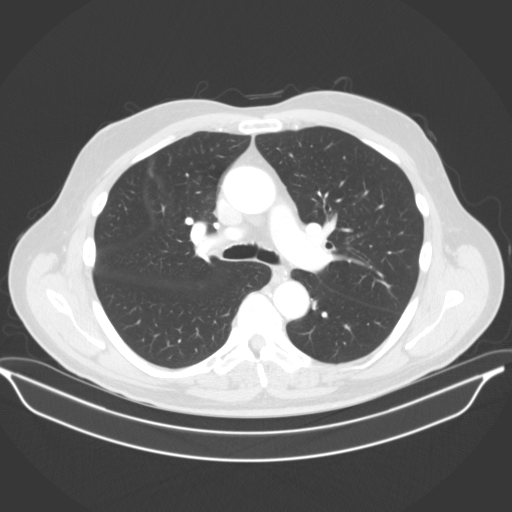
[im 77/116  soft-tissue]
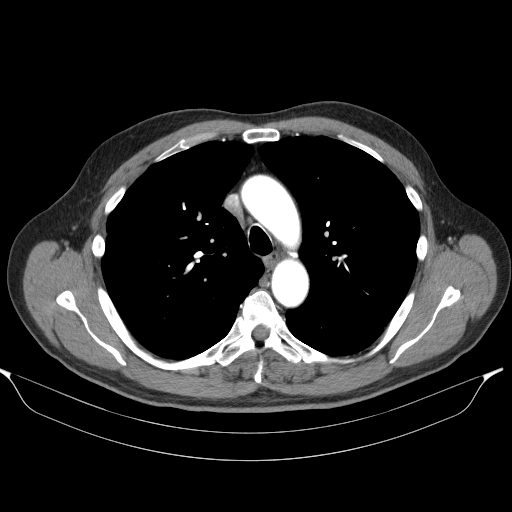
[im 87/116  lung]
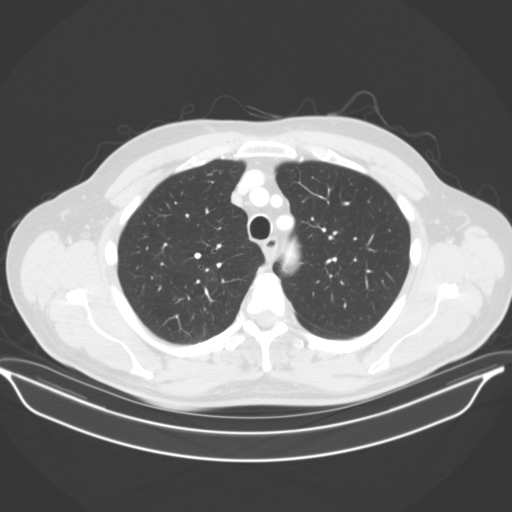
[im 96/116  soft-tissue]
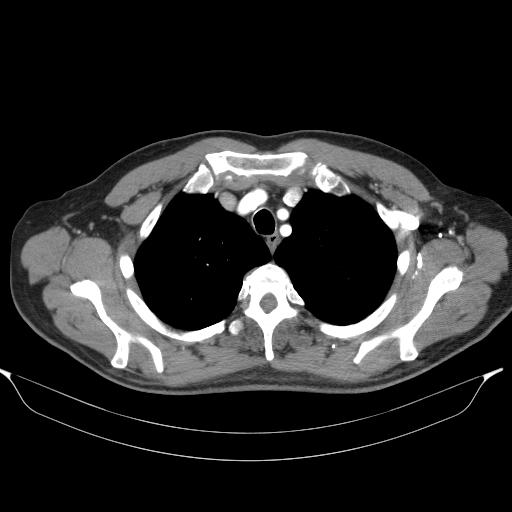
[im 106/116  lung]
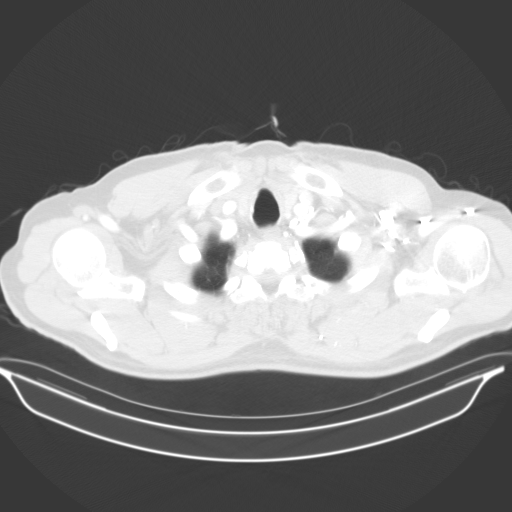

[Series 7: lung · axial · 0.83mm/px · z∈[-8,+220]mm · 7 of 171 slices shown]
[im 19/171  soft-tissue]
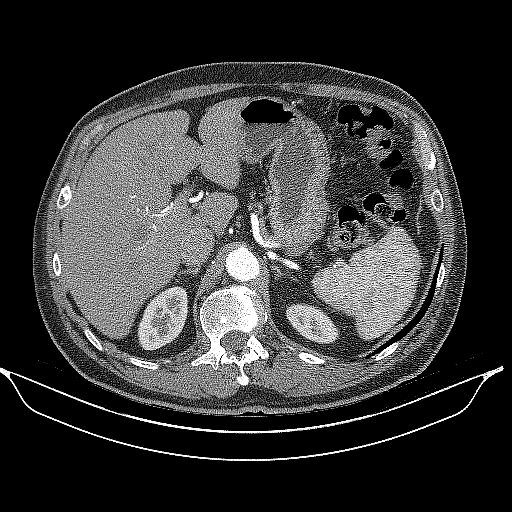
[im 38/171  soft-tissue]
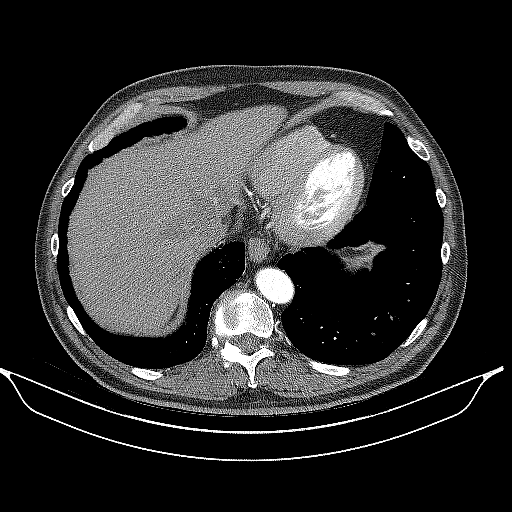
[im 57/171  soft-tissue]
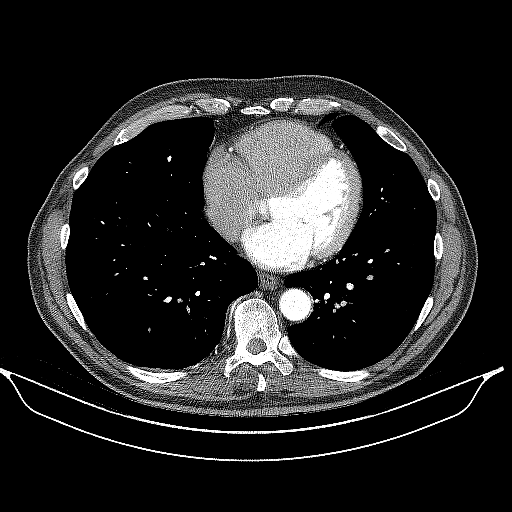
[im 76/171  soft-tissue]
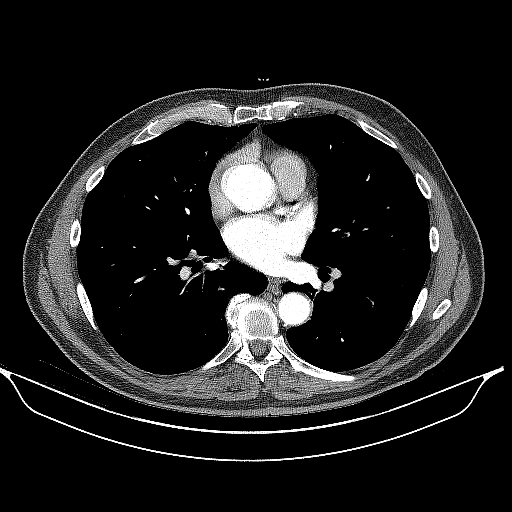
[im 95/171  soft-tissue]
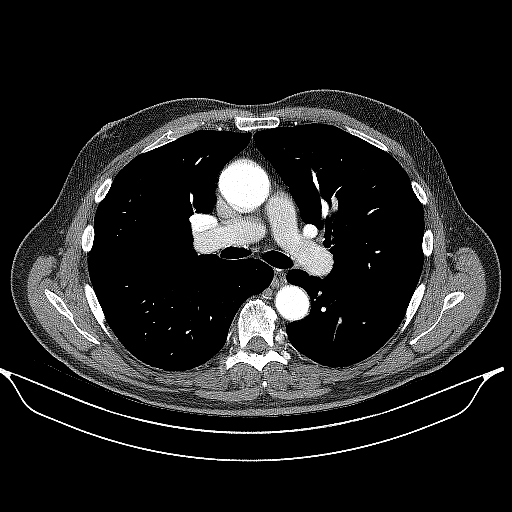
[im 114/171  soft-tissue]
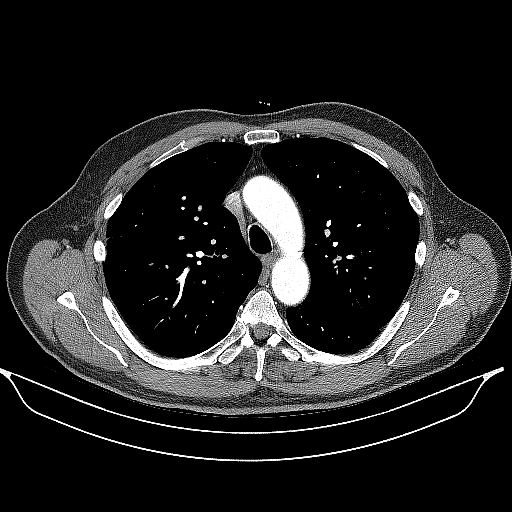
[im 133/171  soft-tissue]
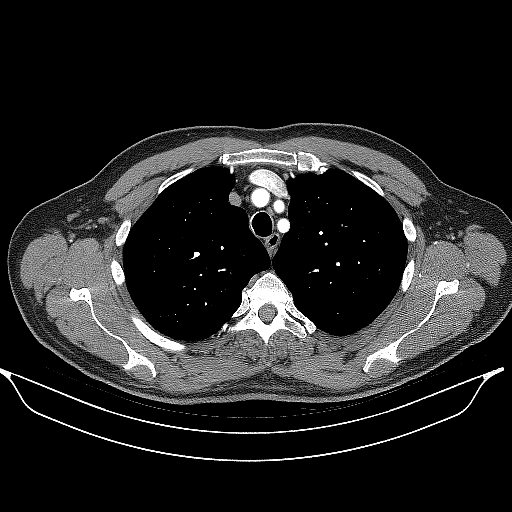

[18 of 32 positions shown; findings below may reference images not displayed]

FINDINGS: Cardiovascular: 4.2 cm ascending thoracic aorta, unchanged compared
to the previous study. Scattered atheromatous plaque in the thoracic
aorta. Normal heart size. No pericardial effusion. Central pulmonary
vasculature with normal caliber. Not well assessed due to phase of
contrast enhancement.

Mediastinum/Nodes: No thoracic inlet adenopathy. No axillary
lymphadenopathy. No mediastinal adenopathy. No hilar
lymphadenopathy.

Lungs/Pleura: Mild scarring in the RIGHT mid chest unchanged. No
consolidation. No pleural effusion. Airways are patent.

LEFT lower lobe pulmonary nodule (image 130, series 7) 6 mm
unchanged since 0373.

RIGHT lower lobe pulmonary nodule (image 108, series 7) 4 mm also
unchanged.

Upper Abdomen: No acute upper abdominal findings. Lobular hepatic
contours. Replaced LEFT hepatic artery arises from LEFT gastric.

Faint hyperenhancement measuring approximately 9 mm in the RIGHT
hepatic lobe, hepatic subsegment V. (image 104, series 4) no
pericholecystic stranding about visualized portions of the
gallbladder. Visualized spleen, pancreas, adrenal glands and kidneys
are normal.

No acute gastrointestinal process to the extent evaluated in the
upper abdomen, limited assessment.

Musculoskeletal: Spinal degenerative changes without acute or
destructive bone finding.

Review of the MIP images confirms the above findings.
IMPRESSION: 1. 4.2 cm ascending thoracic aorta, unchanged compared to the
previous study. Recommend annual imaging followup by CTA or MRA.
This recommendation follows 1151
ACCF/AHA/AATS/ACR/ASA/SCA/SORAGNA/IWASE/TIGER/ERXLEBEN Guidelines for the
Diagnosis and Management of Patients with Thoracic Aortic Disease.
Circulation. 1151; 121: E266-e369. Aortic aneurysm NOS (CBU95-8IF.3)
2. Faint hyperenhancement measuring approximately 12 mm in the RIGHT
hepatic lobe, hepatic subsegment V. suggestion of nodular hepatic
contour which raises the question of background liver disease.
Findings could represent a small hemangioma or vascular shunt though
are indeterminate. In the current context would consider a follow-up
with dedicated abdominal imaging with CT or MR and correlation with
clinical or laboratory signs of liver disease.
3. Bilateral lower lobe pulmonary nodules unchanged since 0373.
Compatible with benign nodules.
4. Aortic atherosclerosis.

These results will be called to the ordering clinician or
representative by the Radiologist Assistant, and communication
documented in the PACS or [REDACTED].

Aortic Atherosclerosis (CBU95-NA7.7).

## 2023-01-15 ENCOUNTER — Ambulatory Visit (INDEPENDENT_AMBULATORY_CARE_PROVIDER_SITE_OTHER): Payer: Commercial Managed Care - PPO | Admitting: Family Medicine

## 2023-01-15 ENCOUNTER — Encounter: Payer: Self-pay | Admitting: Family Medicine

## 2023-01-15 VITALS — BP 151/74 | HR 63 | Ht 71.0 in | Wt 189.0 lb

## 2023-01-15 DIAGNOSIS — Z8639 Personal history of other endocrine, nutritional and metabolic disease: Secondary | ICD-10-CM | POA: Diagnosis not present

## 2023-01-15 DIAGNOSIS — Z0000 Encounter for general adult medical examination without abnormal findings: Secondary | ICD-10-CM | POA: Diagnosis not present

## 2023-01-15 NOTE — Progress Notes (Signed)
Fred Bond - 72 y.o. male MRN [id number]  Date of birth: 1951/03/15  Subjective Chief Complaint  Patient presents with   Annual Exam    HPI Fred Bond is a 72 year old male here today for annual exam.  He reports overall he is doing well.  Abdominal pain has improved.  He did go to donate blood recently and was told that his hemoglobin was tiludronate.  Reports that this was in the upper 11's.  We did check labs and February showing globin of 12.6 with low ferritin.  He did did just recently start an iron supplement.  He is quite active but admits that his diet is not very good.  He is a non-smoker.  Denies alcohol use.  Review of Systems  Constitutional:  Negative for chills, fever, malaise/fatigue and weight loss.  HENT:  Negative for congestion, ear pain and sore throat.   Eyes:  Negative for blurred vision, double vision and pain.  Respiratory:  Negative for cough and shortness of breath.   Cardiovascular:  Negative for chest pain and palpitations.  Gastrointestinal:  Negative for abdominal pain, blood in stool, constipation, heartburn and nausea.  Genitourinary:  Negative for dysuria and urgency.  Musculoskeletal:  Negative for joint pain and myalgias.  Neurological:  Negative for dizziness and headaches.  Endo/Heme/Allergies:  Does not bruise/bleed easily.  Psychiatric/Behavioral:  Negative for depression. The patient is not nervous/anxious and does not have insomnia.     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   Procedure: TOTAL KNEE ARTHROPLASTY;  Surgeon: Gean Birchwood, MD;  Location: [facility] OR;  Service: Orthopedics;  Laterality: Left;   WISDOM TOOTH EXTRACTION      Social History   Socioeconomic History   Marital status: Married    Spouse name: Not on file   Number of children: Not on file   Years of education: Not on file   Highest education level: Not on file  Occupational History   Not on file  Tobacco Use   Smoking status: Never   Smokeless tobacco: Never  Vaping Use   Vaping Use: Never used  Substance and Sexual Activity   Alcohol use: No   Drug use: No   Sexual activity: Not on file  Other Topics Concern   Not on file  Social History Narrative   Not on file   Social Determinants of Health   Financial Resource Strain: Low Risk  (04/17/2021)   Overall Financial Resource Strain (CARDIA)    Difficulty of Paying Living Expenses: Not hard at all  Food Insecurity: No Food Insecurity (04/17/2021)   Hunger Vital Sign    Worried About Running Out of Food in the Last Year: Never true    Ran Out  of Food in the Last Year: Never true  Transportation Needs: No Transportation Needs (04/17/2021)   PRAPARE - Administrator, Civil Service (Medical): No    Lack of Transportation (Non-Medical): No  Physical Activity: Sufficiently Active (04/17/2021)   Exercise Vital Sign    Days of Exercise per Week: 5 days    Minutes of Exercise per Session: 30 min  Stress: No Stress Concern Present (04/17/2021)   [name] of Occupational Health - Occupational Stress Questionnaire    Feeling of Stress : Not at all  Social Connections: Moderately Integrated (04/17/2021)   Social Connection and Isolation Panel [NHANES]    Frequency of Communication with Friends and Family: More than three times a week    Frequency of Social  Gatherings with Friends and Family: More than three times a week    Attends Religious Services: 1 to 4 times per year    Active Member of [employer] or Organizations: No    Attends Banker Meetings: Never    Marital Status: Married    Family History  Problem Relation Age of Onset   Alcohol abuse Unknown        grandfather   Heart attack Unknown        grandparents   Cancer Mother    Melanoma Mother    Melanoma Father     Health Maintenance  Topic Date Due   Zoster Vaccines- Shingrix (1 of 2) 12/24/2023 (Originally 12/26/2002)   INFLUENZA VACCINE  02/28/2023   DTaP/Tdap/Td (3 - Td or Tdap) 03/30/2024   Colonoscopy  06/29/2025   Pneumonia Vaccine 87+ Years old  Completed   COVID-19 Vaccine  Completed   Hepatitis C Screening  Completed   HPV VACCINES  Aged Out     ----------------------------------------------------------------------------------------------------------------------------------------------------------------------------------------------------------------- Physical Exam BP (!) 151/74 (BP Location: Left Arm, Patient Position: Sitting, Cuff Size: Normal)   Pulse 63   Ht 5\' 11"  (1.803 m)   Wt 189 lb (85.7 kg)   SpO2 96%   BMI 26.36 kg/m   Physical Exam Constitutional:      General: He is not in acute distress. HENT:     Head: Normocephalic and atraumatic.     Right Ear: Tympanic membrane and external ear normal.     Left Ear: Tympanic membrane and external ear normal.  Eyes:     General: No scleral icterus. Neck:     Thyroid: No thyromegaly.  Cardiovascular:     Rate and Rhythm: Normal rate and regular rhythm.     Heart sounds: Normal heart sounds.  Pulmonary:     Effort: Pulmonary effort is normal.     Breath sounds: Normal breath sounds.  Abdominal:     General: Bowel sounds are normal. There is no distension.     Palpations: Abdomen is soft.     Tenderness: There is no abdominal tenderness. There is no guarding.  Musculoskeletal:      Cervical back: Normal range of motion.  Lymphadenopathy:     Cervical: No cervical adenopathy.  Skin:    General: Skin is warm and dry.     Findings: No rash.  Neurological:     Mental Status: He is alert and oriented to person, place, and time.     Cranial Nerves: No cranial nerve deficit.     Motor: No abnormal muscle tone.  Psychiatric:        Mood and Affect: Mood normal.        Behavior: Behavior normal.     -------------------------------------------------------------------------------------------------------------------------------------------------------------------------------------------------------------------  Assessment and Plan  Well adult exam Well adult Orders Placed This Encounter  Procedures   CBC with Differential   Iron, TIBC and Ferritin Panel  Screenings: Recent labs reviewed with him in detail.  Up-to-date on colon cancer screening.  Rechecking CBC and iron. Immunizations: Up-to-date Anticipatory guidance/risk factor reduction: Recommendations per AVS.   No orders of the defined types were placed in this encounter.   No follow-ups on file.    This visit occurred during the SARS-CoV-2 public health emergency.  Safety protocols were in place, including screening questions prior to the visit, additional usage of staff PPE, and extensive cleaning of exam room while observing appropriate contact time as indicated for disinfecting solutions.

## 2023-01-15 NOTE — Patient Instructions (Signed)
Preventive Care 65 Years and Older, Male Preventive care refers to lifestyle choices and visits with your health care provider that can promote health and wellness. Preventive care visits are also called wellness exams. What can I expect for my preventive care visit? Counseling During your preventive care visit, your health care provider may ask about your: Medical history, including: Past medical problems. Family medical history. History of falls. Current health, including: Emotional well-being. Home life and relationship well-being. Sexual activity. Memory and ability to understand (cognition). Lifestyle, including: Alcohol, nicotine or tobacco, and drug use. Access to firearms. Diet, exercise, and sleep habits. Work and work environment. Sunscreen use. Safety issues such as seatbelt and bike helmet use. Physical exam Your health care provider will check your: Height and weight. These may be used to calculate your BMI (body mass index). BMI is a measurement that tells if you are at a healthy weight. Waist circumference. This measures the distance around your waistline. This measurement also tells if you are at a healthy weight and may help predict your risk of certain diseases, such as type 2 diabetes and high blood pressure. Heart rate and blood pressure. Body temperature. Skin for abnormal spots. What immunizations do I need?  Vaccines are usually given at various ages, according to a schedule. Your health care provider will recommend vaccines for you based on your age, medical history, and lifestyle or other factors, such as travel or where you work. What tests do I need? Screening Your health care provider may recommend screening tests for certain conditions. This may include: Lipid and cholesterol levels. Diabetes screening. This is done by checking your blood sugar (glucose) after you have not eaten for a while (fasting). Hepatitis C test. Hepatitis B test. HIV (human  immunodeficiency virus) test. STI (sexually transmitted infection) testing, if you are at risk. Lung cancer screening. Colorectal cancer screening. Prostate cancer screening. Abdominal aortic aneurysm (AAA) screening. You may need this if you are a current or former smoker. Talk with your health care provider about your test results, treatment options, and if necessary, the need for more tests. Follow these instructions at home: Eating and drinking  Eat a diet that includes fresh fruits and vegetables, whole grains, lean protein, and low-fat dairy products. Limit your intake of foods with high amounts of sugar, saturated fats, and salt. Take vitamin and mineral supplements as recommended by your health care provider. Do not drink alcohol if your health care provider tells you not to drink. If you drink alcohol: Limit how much you have to 0-2 drinks a day. Know how much alcohol is in your drink. In the [place] one drink equals one 12 oz bottle of beer (355 mL), one 5 oz glass of wine (148 mL), or one 1 oz glass of hard liquor (44 mL). Lifestyle Brush your teeth every morning and night with fluoride toothpaste. Floss one time each day. Exercise for at least 30 minutes 5 or more days each week. Do not use any products that contain nicotine or tobacco. These products include cigarettes, chewing tobacco, and vaping devices, such as e-cigarettes. If you need help quitting, ask your health care provider. Do not use drugs. If you are sexually active, practice safe sex. Use a condom or other form of protection to prevent STIs. Take aspirin only as told by your health care provider. Make sure that you understand how much to take and what form to take. Work with your health care provider to find out whether it is safe   and beneficial for you to take aspirin daily. Ask your health care provider if you need to take a cholesterol-lowering medicine (statin). Find healthy ways to manage stress, such  as: Meditation, yoga, or listening to music. Journaling. Talking to a trusted person. Spending time with friends and family. Safety Always wear your seat belt while driving or riding in a vehicle. Do not drive: If you have been drinking alcohol. Do not ride with someone who has been drinking. When you are tired or distracted. While texting. If you have been using any mind-altering substances or drugs. Wear a helmet and other protective equipment during sports activities. If you have firearms in your house, make sure you follow all gun safety procedures. Minimize exposure to UV radiation to reduce your risk of skin cancer. What's next? Visit your health care provider once a year for an annual wellness visit. Ask your health care provider how often you should have your eyes and teeth checked. Stay up to date on all vaccines. This information is not intended to replace advice given to you by your health care provider. Make sure you discuss any questions you have with your health care provider. Document Revised: 01/11/2021 Document Reviewed: 01/11/2021 Elsevier Patient Education  2024 Elsevier Inc.  

## 2023-01-15 NOTE — Assessment & Plan Note (Signed)
Well adult Orders Placed This Encounter  Procedures   CBC with Differential   Iron, TIBC and Ferritin Panel  Screenings: Recent labs reviewed with him in detail.  Up-to-date on colon cancer screening.  Rechecking CBC and iron. Immunizations: Up-to-date Anticipatory guidance/risk factor reduction: Recommendations per AVS.

## 2023-01-20 ENCOUNTER — Other Ambulatory Visit: Payer: Self-pay | Admitting: Family Medicine

## 2023-02-18 ENCOUNTER — Other Ambulatory Visit: Payer: Self-pay | Admitting: Family Medicine

## 2023-02-26 ENCOUNTER — Other Ambulatory Visit: Payer: Self-pay | Admitting: Family Medicine

## 2023-04-14 ENCOUNTER — Other Ambulatory Visit: Payer: Self-pay | Admitting: Family Medicine

## 2023-05-28 ENCOUNTER — Other Ambulatory Visit: Payer: Self-pay | Admitting: Family Medicine

## 2023-06-16 ENCOUNTER — Other Ambulatory Visit: Payer: Self-pay | Admitting: Family Medicine

## 2023-06-17 NOTE — Telephone Encounter (Signed)
Pls contact the patient to schedule Bupropion 56-month follow-up with Dr. Ashley Royalty. Thanks

## 2023-06-17 NOTE — Telephone Encounter (Signed)
Patient scheduled for 06/25/23, thanks.

## 2023-06-25 ENCOUNTER — Encounter: Payer: Self-pay | Admitting: Family Medicine

## 2023-06-25 ENCOUNTER — Ambulatory Visit (INDEPENDENT_AMBULATORY_CARE_PROVIDER_SITE_OTHER): Payer: Commercial Managed Care - PPO | Admitting: Family Medicine

## 2023-06-25 VITALS — BP 157/80 | HR 66 | Ht 71.0 in | Wt 197.0 lb

## 2023-06-25 DIAGNOSIS — Z23 Encounter for immunization: Secondary | ICD-10-CM

## 2023-06-25 DIAGNOSIS — Z8639 Personal history of other endocrine, nutritional and metabolic disease: Secondary | ICD-10-CM

## 2023-06-25 DIAGNOSIS — F320 Major depressive disorder, single episode, mild: Secondary | ICD-10-CM | POA: Diagnosis not present

## 2023-06-25 MED ORDER — BUPROPION HCL ER (XL) 300 MG PO TB24: 300.0000 mg | ORAL_TABLET | Freq: Every day | ORAL | 1 refills | Status: DC

## 2023-06-25 NOTE — Progress Notes (Signed)
Fred Bond - 72 y.o. male MRN [id number]  Date of birth: 1951/06/17  Subjective Chief Complaint  Patient presents with   Medical Management of Chronic Issues    HPI Fred Bond is a 72 y.o. male here today for follow up visit.   He remains on bupropion.  Sadly his wife passed away last week from cancer.  He has been trying to remain strong however feels that he may need to increase his bupropion for now.  Historically this has worked pretty well for him.  He is looking into grief counseling through hospice.  He has pretty good support from family.  He is having some difficulty with sleep.  Appetite is okay.  ROS:  A comprehensive ROS was completed and negative except as noted per HPI  No Known Allergies  Past Medical History:  Diagnosis Date   Asthma 08/07/2012   Childhood    Depression    Headache    History of iron deficiency 08/07/2012   Hypercholesterolemia    Hypertensive retinopathy of both eyes 03/20/2017   Seen on eye exam August 2018   Mononucleosis    at age 34   OA (osteoarthritis) of knee    left   Recurrent pneumonia 08/07/2012   Tear of meniscus of left knee 08/07/2012    Past Surgical History:  Procedure Laterality Date   COLONOSCOPY W/ BIOPSIES AND POLYPECTOMY     EYE SURGERY     KNEE ARTHROSCOPY     left 2014   TONSILLECTOMY     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   Procedure: TOTAL KNEE ARTHROPLASTY;  Surgeon: Gean Birchwood, MD;  Location: [facility] OR;  Service: Orthopedics;  Laterality: Left;   WISDOM TOOTH EXTRACTION      Social History   Socioeconomic History   Marital status: Married    Spouse name: Not on file   Number of children: Not on file   Years of education: Not on file   Highest education level: Not on file  Occupational History   Not on file  Tobacco Use   Smoking status: Never   Smokeless tobacco: Never  Vaping Use   Vaping status: Never Used  Substance and Sexual Activity    Alcohol use: No   Drug use: No   Sexual activity: Not on file  Other Topics Concern   Not on file  Social History Narrative   Not on file   Social Determinants of Health   Financial Resource Strain: Low Risk  (04/17/2021)   Overall Financial Resource Strain (CARDIA)    Difficulty of Paying Living Expenses: Not hard at all  Food Insecurity: No Food Insecurity (04/17/2021)   Hunger Vital Sign    Worried About Running Out of Food in the Last Year: Never true    Ran Out of Food in the Last Year: Never true  Transportation Needs: No Transportation Needs (04/17/2021)   PRAPARE - Administrator, Civil Service (Medical): No    Lack of Transportation (Non-Medical): No  Physical Activity: Sufficiently Active (04/17/2021)   Exercise Vital Sign    Days of Exercise per Week: 5 days    Minutes of Exercise per Session: 30 min  Stress: No Stress Concern Present (04/17/2021)   [name] of Occupational Health - Occupational Stress Questionnaire    Feeling of Stress : Not at all  Social Connections: Moderately Integrated (04/17/2021)   Social Connection and Isolation Panel [NHANES]    Frequency of  Communication with Friends and Family: More than three times a week    Frequency of Social Gatherings with Friends and Family: More than three times a week    Attends Religious Services: 1 to 4 times per year    Active Member of Clubs or Organizations: No    Attends Banker Meetings: Never    Marital Status: Married    Family History  Problem Relation Age of Onset   Alcohol abuse Unknown        grandfather   Heart attack Unknown        grandparents   Cancer Mother    Melanoma Mother    Melanoma Father     Health Maintenance  Topic Date Due   COVID-19 Vaccine (4 - 2023-24 season) 09/29/2023 (Originally 03/31/2023)   Zoster Vaccines- Shingrix (1 of 2) 12/24/2023 (Originally 12/26/2002)   DTaP/Tdap/Td (3 - Td or Tdap) 03/30/2024   Colonoscopy  06/29/2025   Pneumonia  Vaccine 17+ Years old  Completed   INFLUENZA VACCINE  Completed   Hepatitis C Screening  Completed   HPV VACCINES  Aged Out     ----------------------------------------------------------------------------------------------------------------------------------------------------------------------------------------------------------------- Physical Exam BP (!) 157/80 (BP Location: Right Arm, Patient Position: Sitting, Cuff Size: Normal)   Pulse 66   Ht 5\' 11"  (1.803 m)   Wt 197 lb (89.4 kg)   SpO2 97%   BMI 27.48 kg/m   Physical Exam Constitutional:      Appearance: Normal appearance.  Cardiovascular:     Rate and Rhythm: Normal rate and regular rhythm.  Pulmonary:     Effort: Pulmonary effort is normal.     Breath sounds: Normal breath sounds.  Musculoskeletal:     Cervical back: Neck supple.  Neurological:     General: No focal deficit present.     Mental Status: He is alert.  Psychiatric:     Comments: Appropriately tearful     ------------------------------------------------------------------------------------------------------------------------------------------------------------------------------------------------------------------- Assessment and Plan  Depression, major, single episode, mild (HCC) Sadly, his wife recently passed away.  Offered condolences and support.  I encouraged him to pursue grief counseling.  Increasing bupropion to 300 mg daily.  I will plan to see him back in 1 month.  I encouraged him to reach out if I can help further.   Meds ordered this encounter  Medications   buPROPion (WELLBUTRIN XL) 300 MG 24 hr tablet    Sig: Take 1 tablet (300 mg total) by mouth daily.    Dispense:  90 tablet    Refill:  1    Return in about 4 weeks (around 07/23/2023) for Mood/BH.    This visit occurred during the SARS-CoV-2 public health emergency.  Safety protocols were in place, including screening questions prior to the visit, additional usage of staff  PPE, and extensive cleaning of exam room while observing appropriate contact time as indicated for disinfecting solutions.

## 2023-06-25 NOTE — Assessment & Plan Note (Signed)
Sadly, his wife recently passed away.  Offered condolences and support.  I encouraged him to pursue grief counseling.  Increasing bupropion to 300 mg daily.  I will plan to see him back in 1 month.  I encouraged him to reach out if I can help further.

## 2023-06-25 NOTE — Patient Instructions (Signed)
Managing Loss, Adult  People experience loss in many different ways throughout their lives. Events such as moving, changing jobs, and losing friends can create a sense of loss. The loss may be as serious as a major health change, divorce, death of a pet, or death of a loved one. All of these types of loss are likely to create a physical and emotional reaction known as grief. Grief is the result of a major change or an absence of something or someone that you count on. Grief is a normal reaction to loss.  A variety of factors can affect your grieving experience, including:  The nature of your loss.  Your relationship to what or whom you lost.  Your understanding of grief and how to manage it.  Your support system.  Be aware that when grief becomes extreme, it can lead to more severe issues like isolation, depression, anxiety, or suicidal thoughts. Talk with your health care provider if you have any of these issues.  How to manage lifestyle changes    Keep to your normal routine as much as possible.  If you have trouble focusing or doing normal activities, it is acceptable to take some time away from your normal routine.  Spend time with friends and loved ones.  Eat a healthy diet, get plenty of sleep, and rest when you feel tired.  How to recognize changes   The way that you deal with your grief will affect your ability to function as you normally do. When grieving, you may experience these changes:  Numbness, shock, sadness, anxiety, anger, denial, and guilt.  Thoughts about death.  Unexpected crying.  A physical sensation of emptiness in your stomach.  Problems sleeping and eating.  Tiredness (fatigue).  Loss of interest in normal activities.  Dreaming about or imagining seeing the person who died.  A need to remember what or whom you lost.  Difficulty thinking about anything other than your loss for a period of time.  Relief. If you have been expecting the loss for a while, you may feel a sense of relief when it  happens.  Follow these instructions at home:  Activity    Express your feelings in healthy ways, such as:  Talking with others about your loss. It may be helpful to find others who have had a similar loss, such as a support group.  Writing down your feelings in a journal.  Doing physical activities to release stress and emotional energy.  Doing creative activities like painting, sculpting, or playing or listening to music.  Practicing resilience. This is the ability to recover and adjust after facing challenges. Reading some resources that encourage resilience may help you to learn ways to practice those behaviors.     General instructions  Be patient with yourself and others. Allow the grieving process to happen, and remember that grieving takes time.  It is likely that you may never feel completely done with some grief. You may find a way to move on while still cherishing memories and feelings about your loss.  Accepting your loss is a process. It can take months or longer to adjust.  Keep all follow-up visits. This is important.  Where to find support  To get support for managing loss:  Ask your health care provider for help and recommendations, such as grief counseling or therapy.  Think about joining a support group for people who are managing a loss.  Where to find more information  You can find more information  about managing loss from:  American Society of Clinical Oncology: www.cancer.net  American Psychological Association: DiceTournament.ca  Contact a health care provider if:  Your grief is extreme and keeps getting worse.  You have ongoing grief that does not improve.  Your body shows symptoms of grief, such as illness.  You feel depressed, anxious, or hopeless.  Get help right away if:  You have thoughts about hurting yourself or others.  Get help right away if you feel like you may hurt yourself or others, or have thoughts about taking your own life. Go to your nearest emergency room or:  Call 911.  Call the  National Suicide Prevention Lifeline at 856-808-5870 or 988. This is open 24 hours a day.  Text the Crisis Text Line at (787) 881-2276.  Summary  Grief is the result of a major change or an absence of someone or something that you count on. Grief is a normal reaction to loss.  The depth of grief and the period of recovery depend on the type of loss and your ability to adjust to the change and process your feelings.  Processing grief requires patience and a willingness to accept your feelings and talk about your loss with people who are supportive.  It is important to find resources that work for you and to realize that people experience grief differently. There is not one grieving process that works for everyone in the same way.  Be aware that when grief becomes extreme, it can lead to more severe issues like isolation, depression, anxiety, or suicidal thoughts. Talk with your health care provider if you have any of these issues.  This information is not intended to replace advice given to you by your health care provider. Make sure you discuss any questions you have with your health care provider.  Document Revised: 03/06/2021 Document Reviewed: 03/06/2021  Elsevier Patient Education  2024 [employer].

## 2023-06-26 ENCOUNTER — Other Ambulatory Visit: Payer: Self-pay | Admitting: Family Medicine

## 2023-07-03 DIAGNOSIS — K080 Exfoliation of teeth due to systemic causes: Secondary | ICD-10-CM | POA: Diagnosis not present

## 2023-07-03 DIAGNOSIS — L578 Other skin changes due to chronic exposure to nonionizing radiation: Secondary | ICD-10-CM | POA: Diagnosis not present

## 2023-07-03 DIAGNOSIS — L309 Dermatitis, unspecified: Secondary | ICD-10-CM | POA: Diagnosis not present

## 2023-07-09 ENCOUNTER — Other Ambulatory Visit: Payer: Self-pay | Admitting: Thoracic Surgery (Cardiothoracic Vascular Surgery)

## 2023-07-09 DIAGNOSIS — I7121 Aneurysm of the ascending aorta, without rupture: Secondary | ICD-10-CM

## 2023-07-10 DIAGNOSIS — Z79899 Other long term (current) drug therapy: Secondary | ICD-10-CM | POA: Diagnosis not present

## 2023-07-10 DIAGNOSIS — L400 Psoriasis vulgaris: Secondary | ICD-10-CM | POA: Diagnosis not present

## 2023-07-18 DIAGNOSIS — K080 Exfoliation of teeth due to systemic causes: Secondary | ICD-10-CM | POA: Diagnosis not present

## 2023-07-22 ENCOUNTER — Ambulatory Visit: Payer: Commercial Managed Care - PPO | Admitting: Family Medicine

## 2023-07-26 ENCOUNTER — Encounter: Payer: Self-pay | Admitting: Family Medicine

## 2023-07-26 ENCOUNTER — Ambulatory Visit (INDEPENDENT_AMBULATORY_CARE_PROVIDER_SITE_OTHER): Payer: Medicare Other | Admitting: Family Medicine

## 2023-07-26 VITALS — BP 149/73 | HR 66 | Ht 71.0 in | Wt 203.0 lb

## 2023-07-26 DIAGNOSIS — F4321 Adjustment disorder with depressed mood: Secondary | ICD-10-CM

## 2023-07-26 DIAGNOSIS — F320 Major depressive disorder, single episode, mild: Secondary | ICD-10-CM | POA: Diagnosis not present

## 2023-07-26 MED ORDER — BUPROPION HCL ER (XL) 300 MG PO TB24: 300.0000 mg | ORAL_TABLET | Freq: Every day | ORAL | 1 refills | Status: DC

## 2023-07-26 NOTE — Progress Notes (Signed)
Fred Bond - 72 y.o. male MRN [id number]  Date of birth: 13-Sep-1950  Subjective Chief Complaint  Patient presents with   Medical Management of Chronic Issues    HPI Fred Bond is a 72 y.o. male here today for follow up of mood.  Bupropion increased to 300mg  at last visit.  Reports that he is doing pretty well.  He is seeing grief counseling through hospice.  He has found this to be helpful.  He only has a few sessions of counseling left through them and would like to be referred to a therapist for additional counseling.   ROS:  A comprehensive ROS was completed and negative except as noted per HPI  No Known Allergies  Past Medical History:  Diagnosis Date   Asthma 08/07/2012   Childhood    Depression    Headache    History of iron deficiency 08/07/2012   Hypercholesterolemia    Hypertensive retinopathy of both eyes 03/20/2017   Seen on eye exam August 2018   Mononucleosis    at age 61   OA (osteoarthritis) of knee    left   Recurrent pneumonia 08/07/2012   Tear of meniscus of left knee 08/07/2012    Past Surgical History:  Procedure Laterality Date   COLONOSCOPY W/ BIOPSIES AND POLYPECTOMY     EYE SURGERY     KNEE ARTHROSCOPY     left 2014   TONSILLECTOMY     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   Procedure: TOTAL KNEE ARTHROPLASTY;  Surgeon: Gean Birchwood, MD;  Location: [facility] OR;  Service: Orthopedics;  Laterality: Left;   WISDOM TOOTH EXTRACTION      Social History   Socioeconomic History   Marital status: Married    Spouse name: Not on file   Number of children: Not on file   Years of education: Not on file   Highest education level: Not on file  Occupational History   Not on file  Tobacco Use   Smoking status: Never   Smokeless tobacco: Never  Vaping Use   Vaping status: Never Used  Substance and Sexual Activity   Alcohol use: No   Drug use: No   Sexual activity: Not on file  Other Topics Concern   Not  on file  Social History Narrative   Not on file   Social Drivers of Health   Financial Resource Strain: Low Risk  (04/17/2021)   Overall Financial Resource Strain (CARDIA)    Difficulty of Paying Living Expenses: Not hard at all  Food Insecurity: No Food Insecurity (04/17/2021)   Hunger Vital Sign    Worried About Running Out of Food in the Last Year: Never true    Ran Out of Food in the Last Year: Never true  Transportation Needs: No Transportation Needs (04/17/2021)   PRAPARE - Administrator, Civil Service (Medical): No    Lack of Transportation (Non-Medical): No  Physical Activity: Sufficiently Active (04/17/2021)   Exercise Vital Sign    Days of Exercise per Week: 5 days    Minutes of Exercise per Session: 30 min  Stress: No Stress Concern Present (04/17/2021)   [name] of Occupational Health - Occupational Stress Questionnaire    Feeling of Stress : Not at all  Social Connections: Moderately Integrated (04/17/2021)   Social Connection and Isolation Panel [NHANES]    Frequency of Communication with Friends and Family: More than three times a week    Frequency of  Social Gatherings with Friends and Family: More than three times a week    Attends Religious Services: 1 to 4 times per year    Active Member of [employer] or Organizations: No    Attends Engineer, structural: Never    Marital Status: Married    Family History  Problem Relation Age of Onset   Alcohol abuse Unknown        grandfather   Heart attack Unknown        grandparents   Cancer Mother    Melanoma Mother    Melanoma Father     Health Maintenance  Topic Date Due   Medicare Annual Wellness (AWV)  04/17/2022   COVID-19 Vaccine (4 - 2024-25 season) 09/29/2023 (Originally 03/31/2023)   Zoster Vaccines- Shingrix (1 of 2) 12/24/2023 (Originally 12/26/2002)   DTaP/Tdap/Td (3 - Td or Tdap) 03/30/2024   Colonoscopy  06/29/2025   Pneumonia Vaccine 47+ Years old  Completed   INFLUENZA VACCINE   Completed   Hepatitis C Screening  Completed   HPV VACCINES  Aged Out     ----------------------------------------------------------------------------------------------------------------------------------------------------------------------------------------------------------------- Physical Exam BP (!) 149/73   Pulse 66   Ht 5\' 11"  (1.803 m)   Wt 203 lb (92.1 kg)   SpO2 95%   BMI 28.31 kg/m   Physical Exam Constitutional:      Appearance: Normal appearance.  Eyes:     General: No scleral icterus. Cardiovascular:     Rate and Rhythm: Normal rate and regular rhythm.  Pulmonary:     Effort: Pulmonary effort is normal.     Breath sounds: Normal breath sounds.  Neurological:     General: No focal deficit present.     Mental Status: He is alert.  Psychiatric:        Mood and Affect: Mood normal.        Behavior: Behavior normal.     ------------------------------------------------------------------------------------------------------------------------------------------------------------------------------------------------------------------- Assessment and Plan  Depression, major, single episode, mild (HCC) Doing well with increase in bupropion at 300mg .  Will plan to continue this at current strength.  Referral placed for continued grief counseling.    Meds ordered this encounter  Medications   buPROPion (WELLBUTRIN XL) 300 MG 24 hr tablet    Sig: Take 1 tablet (300 mg total) by mouth daily.    Dispense:  90 tablet    Refill:  1    Return in about 3 months (around 10/24/2023) for Mood/BH.    This visit occurred during the SARS-CoV-2 public health emergency.  Safety protocols were in place, including screening questions prior to the visit, additional usage of staff PPE, and extensive cleaning of exam room while observing appropriate contact time as indicated for disinfecting solutions.

## 2023-07-26 NOTE — Assessment & Plan Note (Addendum)
Doing well with increase in bupropion at 300mg .  Will plan to continue this at current strength.  Referral placed for continued grief counseling.

## 2023-07-26 NOTE — Progress Notes (Signed)
Pharmacy gave refill of Wellbutrin 150mg  x 30 days. Verified with the pharmacy.   Refilling updated Rx for Wellbutrin 300mg  x 90 days.

## 2023-07-29 ENCOUNTER — Other Ambulatory Visit: Payer: Self-pay | Admitting: Family Medicine

## 2023-08-02 ENCOUNTER — Encounter: Payer: Self-pay | Admitting: Thoracic Surgery (Cardiothoracic Vascular Surgery)

## 2023-08-07 NOTE — Progress Notes (Addendum)
 [address]       [place] [address]             [phone]        Fred Bond [id number] 11/29/1950  History of Present Illness:  Fred Bond is a 73 yo male with known history of  hypertension with retinopathy, dyslipidemia, asthma, major depression and idiopathic medial aortopathy. He has been followed by our office for several years with a stable 4.2cm fusiform ascending aortic aneurysm. He is also known to have bilateral pulmonary nodules since at least 2018.  He presents today for 18 month aneurysm surveillance.  Overall patient is doing okay.  He states he has noticed his blood pressure has been elevated recently.  He has been under increased stress with the recent passing of his wife which has been difficult.  He states also since that time he has been eating a lot of prepared frozen meals which I explained to him are high is sodium and can be attributing to elevated BP.  Current Outpatient Medications on File Prior to Visit  Medication Sig Dispense Refill   ASPIRIN  LOW DOSE 81 MG EC tablet Take 81 mg by mouth. After surgery only     buPROPion  (WELLBUTRIN  XL) 300 MG 24 hr tablet Take 1 tablet (300 mg total) by mouth daily. 90 tablet 1   clobetasol  cream (TEMOVATE ) 0.05 % Apply topically 2 (two) times daily.     L-Lysine 500 MG TABS Take 500 mg by mouth daily.      Multiple Vitamins-Minerals (CENTRUM SILVER ULTRA MENS) TABS Take 1 tablet by mouth daily.      niacin 500 MG tablet Take 500 mg by mouth daily with breakfast.     pantoprazole  (PROTONIX ) 40 MG tablet Take 1 tablet by mouth once daily 30 tablet 0   pravastatin  (PRAVACHOL ) 40 MG tablet Take 1 tablet by mouth once daily 90 tablet 3   tadalafil  (CIALIS ) 20 MG tablet TAKE 1/2 TO 1 (ONE-HALF TO ONE) TABLET BY MOUTH EVERY OTHER DAY AS NEEDED FOR  ERECTILE  DYSFUNCTION 15 tablet 11   No current facility-administered medications on file prior to visit.     Physical Exam  BP (!) 160/82    Pulse 79   Resp 20   Ht 5\' 11"  (1.803 m)   SpO2 97% Comment: RA  BMI 28.31 kg/m   Gen: NAD Heart: RRR Lungs: CTA bilaterally Ext: no edema Neck: no bruit Neuro: grossly intact  CTA Results:  FINDINGS: Cardiovascular: Normal heart size. No significant pericardial effusion/thickening. Left anterior descending coronary atherosclerosis. Atherosclerotic thoracic aorta with dilated 4.4 cm ascending thoracic aorta, unchanged. Thoracic aorta measures 4.5 cm diameter at the level of the sinuses of Valsalva with maximum descending thoracic aortic diameter 3.1 cm and with aortic diameter 2.7 cm at the diaphragmatic hiatus. No thoracic aortic dissection, intramural hematoma or penetrating atherosclerotic ulcer. Normal caliber pulmonary arteries. No central pulmonary emboli.   Mediastinum/Nodes: No significant thyroid nodules. Unremarkable esophagus. No pathologically enlarged axillary, mediastinal or hilar lymph nodes.   Lungs/Pleura: No pneumothorax. No pleural effusion. Solid 0.5 cm left lower lobe pulmonary nodule (series 11/image 121), stable. No acute consolidative airspace disease, lung masses or additional significant pulmonary nodules.   Upper abdomen: No acute abnormality.   Musculoskeletal: No aggressive appearing focal osseous lesions. Moderate thoracic spondylosis.   Review of the MIP images confirms the above findings.   IMPRESSION: 1. Dilated 4.4 cm ascending thoracic aorta, unchanged. Recommend annual  imaging followup by CTA or MRA. This recommendation follows 2010 ACCF/AHA/AATS/ACR/ASA/SCA/SCAI/SIR/STS/SVM Guidelines for the Diagnosis and Management of Patients with Thoracic Aortic Disease. Circulation. 2010; 121: W098-J191. Aortic aneurysm NOS (ICD10-I71.9) 2. One vessel coronary atherosclerosis. 3.  Aortic Atherosclerosis (ICD10-I70.0).     Electronically Signed   By: Levell Reach M.D.   On: 08/09/2023 13:56    A/P:    Stable Ascending Aortic  Aneurysm-measuring 4.4 cm 2. BP control-patient having elevated BP.. I instructed patient to adjust diet and eliminate predone frozen meals... Should his BP remain elevated he was instructed to follow up with PCP for initiation of antihypertensive  RTC in 18 months with repeat CTA  Risk Modification:  Patient was counseled on importance of Blood Pressure Control.  Despite Medical intervention if the patient notices persistently elevated blood pressure readings.  They are instructed to contact their Primary Care Physician  Please avoid use of Fluoroquinolones as this can potentially increase your risk of Aortic Rupture and/or Dissection  Patient educated on signs and symptoms of Aortic Dissection, handout also provided in AVS  Kristel Durkee, PA-C 08/07/23

## 2023-08-09 ENCOUNTER — Ambulatory Visit: Payer: Commercial Managed Care - PPO | Admitting: Thoracic Surgery (Cardiothoracic Vascular Surgery)

## 2023-08-09 ENCOUNTER — Ambulatory Visit
Admission: RE | Admit: 2023-08-09 | Discharge: 2023-08-09 | Disposition: A | Discharge status: Home / Self Care | Payer: Medicare Other | Source: Ambulatory Visit | Attending: Thoracic Surgery (Cardiothoracic Vascular Surgery) | Admitting: Thoracic Surgery (Cardiothoracic Vascular Surgery)

## 2023-08-09 DIAGNOSIS — I77810 Thoracic aortic ectasia: Secondary | ICD-10-CM | POA: Diagnosis not present

## 2023-08-09 DIAGNOSIS — I700 Atherosclerosis of aorta: Secondary | ICD-10-CM | POA: Diagnosis not present

## 2023-08-09 DIAGNOSIS — I2510 Atherosclerotic heart disease of native coronary artery without angina pectoris: Secondary | ICD-10-CM | POA: Diagnosis not present

## 2023-08-09 DIAGNOSIS — I7121 Aneurysm of the ascending aorta, without rupture: Secondary | ICD-10-CM

## 2023-08-09 MED ORDER — IOPAMIDOL (ISOVUE-370) INJECTION 76%
100.0000 mL | Freq: Once | INTRAVENOUS | Status: AC | PRN
  Administered 2023-08-09: 75 mL via INTRAVENOUS

## 2023-08-14 ENCOUNTER — Ambulatory Visit (INDEPENDENT_AMBULATORY_CARE_PROVIDER_SITE_OTHER): Payer: Medicare Other

## 2023-08-14 VITALS — BP 160/82 | HR 79 | Resp 20 | Ht 71.0 in

## 2023-08-14 DIAGNOSIS — I7121 Aneurysm of the ascending aorta, without rupture: Secondary | ICD-10-CM | POA: Diagnosis not present

## 2023-08-14 NOTE — Addendum Note (Signed)
 Addended by: Jadine May on: 08/14/2023 02:53 PM   Modules accepted: Level of Service

## 2023-08-26 DIAGNOSIS — K080 Exfoliation of teeth due to systemic causes: Secondary | ICD-10-CM | POA: Diagnosis not present

## 2023-08-30 ENCOUNTER — Other Ambulatory Visit: Payer: Self-pay | Admitting: Family Medicine

## 2023-10-09 DIAGNOSIS — L400 Psoriasis vulgaris: Secondary | ICD-10-CM | POA: Diagnosis not present

## 2023-10-09 DIAGNOSIS — L720 Epidermal cyst: Secondary | ICD-10-CM | POA: Diagnosis not present

## 2023-10-09 DIAGNOSIS — L821 Other seborrheic keratosis: Secondary | ICD-10-CM | POA: Diagnosis not present

## 2023-10-24 ENCOUNTER — Encounter: Payer: Self-pay | Admitting: Family Medicine

## 2023-10-24 ENCOUNTER — Ambulatory Visit (INDEPENDENT_AMBULATORY_CARE_PROVIDER_SITE_OTHER): Payer: Medicare Other | Admitting: Family Medicine

## 2023-10-24 VITALS — BP 164/80 | HR 63 | Ht 71.0 in | Wt 207.0 lb

## 2023-10-24 DIAGNOSIS — E785 Hyperlipidemia, unspecified: Secondary | ICD-10-CM | POA: Diagnosis not present

## 2023-10-24 DIAGNOSIS — Z8639 Personal history of other endocrine, nutritional and metabolic disease: Secondary | ICD-10-CM

## 2023-10-24 DIAGNOSIS — Z125 Encounter for screening for malignant neoplasm of prostate: Secondary | ICD-10-CM

## 2023-10-24 DIAGNOSIS — F4321 Adjustment disorder with depressed mood: Secondary | ICD-10-CM

## 2023-10-24 DIAGNOSIS — F320 Major depressive disorder, single episode, mild: Secondary | ICD-10-CM

## 2023-10-24 DIAGNOSIS — I10 Essential (primary) hypertension: Secondary | ICD-10-CM

## 2023-10-24 MED ORDER — AMLODIPINE BESYLATE 5 MG PO TABS: 5.0000 mg | ORAL_TABLET | Freq: Every day | ORAL | 1 refills | Status: DC

## 2023-10-24 MED ORDER — BUPROPION HCL ER (XL) 300 MG PO TB24: 300.0000 mg | ORAL_TABLET | Freq: Every day | ORAL | 1 refills | Status: DC

## 2023-10-24 MED ORDER — PANTOPRAZOLE SODIUM 40 MG PO TBEC: 40.0000 mg | DELAYED_RELEASE_TABLET | Freq: Every day | ORAL | 1 refills | Status: DC

## 2023-10-24 NOTE — Patient Instructions (Signed)
 Managing Your Hypertension Hypertension, also called high blood pressure, is when the force of the blood pressing against the walls of the arteries is too strong. Arteries are blood vessels that carry blood from your heart throughout your body. Hypertension forces the heart to work harder to pump blood and may cause the arteries to become narrow or stiff. Understanding blood pressure readings A blood pressure reading includes a higher number over a lower number: The first, or top, number is called the systolic pressure. It is a measure of the pressure in your arteries as your heart beats. The second, or bottom number, is called the diastolic pressure. It is a measure of the pressure in your arteries as the heart relaxes. For most people, a normal blood pressure is below 120/80. Your personal target blood pressure may vary depending on your medical conditions, your age, and other factors. Blood pressure is classified into four stages. Based on your blood pressure reading, your health care provider may use the following stages to determine what type of treatment you need, if any. Systolic pressure and diastolic pressure are measured in a unit called millimeters of mercury (mmHg). Normal Systolic pressure: below 120. Diastolic pressure: below 80. Elevated Systolic pressure: 120-129. Diastolic pressure: below 80. Hypertension stage 1 Systolic pressure: 130-139. Diastolic pressure: 80-89. Hypertension stage 2 Systolic pressure: 140 or above. Diastolic pressure: 90 or above. How can this condition affect me? Managing your hypertension is very important. Over time, hypertension can damage the arteries and decrease blood flow to parts of the body, including the brain, heart, and kidneys. Having untreated or uncontrolled hypertension can lead to: A heart attack. A stroke. A weakened blood vessel (aneurysm). Heart failure. Kidney damage. Eye damage. Memory and concentration problems. Vascular  dementia. What actions can I take to manage this condition? Hypertension can be managed by making lifestyle changes and possibly by taking medicines. Your health care provider will help you make a plan to bring your blood pressure within a normal range. You may be referred for counseling on a healthy diet and physical activity. Nutrition  Eat a diet that is high in fiber and potassium, and low in salt (sodium), added sugar, and fat. An example eating plan is called the DASH diet. DASH stands for Dietary Approaches to Stop Hypertension. To eat this way: Eat plenty of fresh fruits and vegetables. Try to fill one-half of your plate at each meal with fruits and vegetables. Eat whole grains, such as whole-wheat pasta, brown rice, or whole-grain bread. Fill about one-fourth of your plate with whole grains. Eat low-fat dairy products. Avoid fatty cuts of meat, processed or cured meats, and poultry with skin. Fill about one-fourth of your plate with lean proteins such as fish, chicken without skin, beans, eggs, and tofu. Avoid pre-made and processed foods. These tend to be higher in sodium, added sugar, and fat. Reduce your daily sodium intake. Many people with hypertension should eat less than 1,500 mg of sodium a day. Lifestyle  Work with your health care provider to maintain a healthy body weight or to lose weight. Ask what an ideal weight is for you. Get at least 30 minutes of exercise that causes your heart to beat faster (aerobic exercise) most days of the week. Activities may include walking, swimming, or biking. Include exercise to strengthen your muscles (resistance exercise), such as weight lifting, as part of your weekly exercise routine. Try to do these types of exercises for 30 minutes at least 3 days a week. Do  not use any products that contain nicotine or tobacco. These products include cigarettes, chewing tobacco, and vaping devices, such as e-cigarettes. If you need help quitting, ask your  health care provider. Control any long-term (chronic) conditions you have, such as high cholesterol or diabetes. Identify your sources of stress and find ways to manage stress. This may include meditation, deep breathing, or making time for fun activities. Alcohol use Do not drink alcohol if: Your health care provider tells you not to drink. You are pregnant, may be pregnant, or are planning to become pregnant. If you drink alcohol: Limit how much you have to: 0-1 drink a day for women. 0-2 drinks a day for men. Know how much alcohol is in your drink. In the [place] one drink equals one 12 oz bottle of beer (355 mL), one 5 oz glass of wine (148 mL), or one 1 oz glass of hard liquor (44 mL). Medicines Your health care provider may prescribe medicine if lifestyle changes are not enough to get your blood pressure under control and if: Your systolic blood pressure is 130 or higher. Your diastolic blood pressure is 80 or higher. Take medicines only as told by your health care provider. Follow the directions carefully. Blood pressure medicines must be taken as told by your health care provider. The medicine does not work as well when you skip doses. Skipping doses also puts you at risk for problems. Monitoring Before you monitor your blood pressure: Do not smoke, drink caffeinated beverages, or exercise within 30 minutes before taking a measurement. Use the bathroom and empty your bladder (urinate). Sit quietly for at least 5 minutes before taking measurements. Monitor your blood pressure at home as told by your health care provider. To do this: Sit with your back straight and supported. Place your feet flat on the floor. Do not cross your legs. Support your arm on a flat surface, such as a table. Make sure your upper arm is at heart level. Each time you measure, take two or three readings one minute apart and record the results. You may also need to have your blood pressure checked regularly by  your health care provider. General information Talk with your health care provider about your diet, exercise habits, and other lifestyle factors that may be contributing to hypertension. Review all the medicines you take with your health care provider because there may be side effects or interactions. Keep all follow-up visits. Your health care provider can help you create and adjust your plan for managing your high blood pressure. Where to find more information National Heart, Lung, and Blood Institute: PopSteam.is American Heart Association: www.heart.org Contact a health care provider if: You think you are having a reaction to medicines you have taken. You have repeated (recurrent) headaches. You feel dizzy. You have swelling in your ankles. You have trouble with your vision. Get help right away if: You develop a severe headache or confusion. You have unusual weakness or numbness, or you feel faint. You have severe pain in your chest or abdomen. You vomit repeatedly. You have trouble breathing. These symptoms may be an emergency. Get help right away. Call 911. Do not wait to see if the symptoms will go away. Do not drive yourself to the hospital. Summary Hypertension is when the force of blood pumping through your arteries is too strong. If this condition is not controlled, it may put you at risk for serious complications. Your personal target blood pressure may vary depending on your medical conditions,  your age, and other factors. For most people, a normal blood pressure is less than 120/80. Hypertension is managed by lifestyle changes, medicines, or both. Lifestyle changes to help manage hypertension include losing weight, eating a healthy, low-sodium diet, exercising more, stopping smoking, and limiting alcohol. This information is not intended to replace advice given to you by your health care provider. Make sure you discuss any questions you have with your health care  provider. Document Revised: 03/30/2021 Document Reviewed: 03/30/2021 Elsevier Patient Education  2024 [employer].

## 2023-10-24 NOTE — Assessment & Plan Note (Signed)
 BP is elevated today.  Starting amlodipine 5mg  daily.  F/u in 2 weeks for BP check.  Reviewed dietary changes including low sodium diet and incorporation of exercise.

## 2023-10-24 NOTE — Assessment & Plan Note (Signed)
 Doing well with pravastatin.  Will continue.  Updated lipid panel ordered.

## 2023-10-24 NOTE — Assessment & Plan Note (Signed)
 Doing well with increase in bupropion at 300mg .  Will plan to continue this at current strength.

## 2023-10-24 NOTE — Progress Notes (Signed)
 Fred Bond - 73 y.o. male MRN [id number]  Date of birth: [birth year]  Subjective Chief Complaint  Patient presents with   Hypertension   mood    HPI Fred Bond is a 73 y.o. male here today for follow up.   BP elevated today.  He denies symptoms related to HTN.  He has not had symptoms related to HTN including chest pain, shortness of breath, palpitations, headache or vision changes.  He does remain pretty active.   He continues on bupropion for management of depression.  He feels that this is working pretty well for him at this time. .   Tolerating pravastatin well for HLD.   ROS:  A comprehensive ROS was completed and negative except as noted per HPI  No Known Allergies  Past Medical History:  Diagnosis Date   Asthma 08/07/2012   Childhood    Depression    Headache    History of iron deficiency 08/07/2012   Hypercholesterolemia    Hypertensive retinopathy of both eyes 03/20/2017   Seen on eye exam August 2018   Mononucleosis    at age 42   OA (osteoarthritis) of knee    left   Recurrent pneumonia 08/07/2012   Tear of meniscus of left knee 08/07/2012    Past Surgical History:  Procedure Laterality Date   COLONOSCOPY W/ BIOPSIES AND POLYPECTOMY     EYE SURGERY     KNEE ARTHROSCOPY     left 2014   TONSILLECTOMY     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   Procedure: TOTAL KNEE ARTHROPLASTY;  Surgeon: Gean Birchwood, MD;  Location: [facility] OR;  Service: Orthopedics;  Laterality: Left;   WISDOM TOOTH EXTRACTION      Social History   Socioeconomic History   Marital status: Married    Spouse name: Not on file   Number of children: Not on file   Years of education: Not on file   Highest education level: Not on file  Occupational History   Not on file  Tobacco Use   Smoking status: Never   Smokeless tobacco: Never  Vaping Use   Vaping status: Never Used  Substance and Sexual Activity   Alcohol use: No   Drug use: No    Sexual activity: Not on file  Other Topics Concern   Not on file  Social History Narrative   Not on file   Social Drivers of Health   Financial Resource Strain: Low Risk  (04/17/2021)   Overall Financial Resource Strain (CARDIA)    Difficulty of Paying Living Expenses: Not hard at all  Food Insecurity: No Food Insecurity (04/17/2021)   Hunger Vital Sign    Worried About Running Out of Food in the Last Year: Never true    Ran Out of Food in the Last Year: Never true  Transportation Needs: No Transportation Needs (04/17/2021)   PRAPARE - Administrator, Civil Service (Medical): No    Lack of Transportation (Non-Medical): No  Physical Activity: Sufficiently Active (04/17/2021)   Exercise Vital Sign    Days of Exercise per Week: 5 days    Minutes of Exercise per Session: 30 min  Stress: No Stress Concern Present (04/17/2021)   [name] of Occupational Health - Occupational Stress Questionnaire    Feeling of Stress : Not at all  Social Connections: Moderately Integrated (04/17/2021)   Social Connection and Isolation Panel [NHANES]    Frequency of Communication with Friends and Family:  More than three times a week    Frequency of Social Gatherings with Friends and Family: More than three times a week    Attends Religious Services: 1 to 4 times per year    Active Member of Clubs or Organizations: No    Attends Banker Meetings: Never    Marital Status: Married    Family History  Problem Relation Age of Onset   Alcohol abuse Unknown        grandfather   Heart attack Unknown        grandparents   Cancer Mother    Melanoma Mother    Melanoma Father     Health Maintenance  Topic Date Due   Medicare Annual Wellness (AWV)  04/17/2022   COVID-19 Vaccine (4 - 2024-25 season) 03/31/2023   Zoster Vaccines- Shingrix (1 of 2) 12/24/2023 (Originally 12/26/1969)   DTaP/Tdap/Td (3 - Td or Tdap) 03/30/2024   Colonoscopy  06/29/2025   Pneumonia Vaccine 38+  Years old  Completed   INFLUENZA VACCINE  Completed   Hepatitis C Screening  Completed   HPV VACCINES  Aged Out     ----------------------------------------------------------------------------------------------------------------------------------------------------------------------------------------------------------------- Physical Exam BP (!) 163/78 (BP Location: Left Arm, Patient Position: Sitting, Cuff Size: Large)   Pulse 63   Ht 5\' 11"  (1.803 m)   Wt 207 lb (93.9 kg)   SpO2 98%   BMI 28.87 kg/m   Physical Exam Constitutional:      Appearance: Normal appearance.  HENT:     Head: Normocephalic and atraumatic.  Eyes:     General: No scleral icterus. Cardiovascular:     Rate and Rhythm: Normal rate and regular rhythm.  Pulmonary:     Effort: Pulmonary effort is normal.     Breath sounds: Normal breath sounds.  Neurological:     Mental Status: He is alert.  Psychiatric:        Mood and Affect: Mood normal.     ------------------------------------------------------------------------------------------------------------------------------------------------------------------------------------------------------------------- Assessment and Plan  Essential hypertension BP is elevated today.  Starting amlodipine 5mg  daily.  F/u in 2 weeks for BP check.  Reviewed dietary changes including low sodium diet and incorporation of exercise.   Hyperlipidemia with target low density lipoprotein (LDL) cholesterol less than 130 mg/dL Doing well with pravastatin.  Will continue.  Updated lipid panel ordered.   Depression, major, single episode, mild (HCC) Doing well with increase in bupropion at 300mg .  Will plan to continue this at current strength.     Meds ordered this encounter  Medications   amLODipine (NORVASC) 5 MG tablet    Sig: Take 1 tablet (5 mg total) by mouth daily.    Dispense:  90 tablet    Refill:  1   buPROPion (WELLBUTRIN XL) 300 MG 24 hr tablet    Sig: Take 1  tablet (300 mg total) by mouth daily.    Dispense:  90 tablet    Refill:  1   pantoprazole (PROTONIX) 40 MG tablet    Sig: Take 1 tablet (40 mg total) by mouth daily.    Dispense:  90 tablet    Refill:  1    Return in about 2 weeks (around 11/07/2023) for nurse visit for BP check, labs.    This visit occurred during the SARS-CoV-2 public health emergency.  Safety protocols were in place, including screening questions prior to the visit, additional usage of staff PPE, and extensive cleaning of exam room while observing appropriate contact time as indicated for disinfecting solutions.

## 2023-10-28 ENCOUNTER — Other Ambulatory Visit: Payer: Self-pay | Admitting: Family Medicine

## 2023-10-28 DIAGNOSIS — F4321 Adjustment disorder with depressed mood: Secondary | ICD-10-CM

## 2023-10-28 DIAGNOSIS — F320 Major depressive disorder, single episode, mild: Secondary | ICD-10-CM

## 2023-10-28 NOTE — Telephone Encounter (Signed)
 Copied from CRM [id number]. Topic: Clinical - Medication Refill >> Oct 28, 2023  1:56 PM Everette Rank wrote: Most Recent Primary Care Visit:  Provider: Everrett Coombe  Department: [facility]  Visit Type: OFFICE VISIT  Date: 10/24/2023  Medication: buPROPion (WELLBUTRIN XL) 300 MG 24 hr tablet amLODipine (NORVASC) 5 MG tablet  Has the patient contacted their pharmacy? No (Agent: If no, request that the patient contact the pharmacy for the refill. If patient does not wish to contact the pharmacy document the reason why and proceed with request.) (Agent: If yes, when and what did the pharmacy advise?)  Is this the correct pharmacy for this prescription? Yes If no, delete pharmacy and type the correct one.  This is the patient's preferred pharmacy:  [id number] [place], [place] - [address] AT [facility] [address] [place] [address] Phone: [phone] Fax: [phone]  Has the prescription been filled recently? No  Is the patient out of the medication? No  Has the patient been seen for an appointment in the last year OR does the patient have an upcoming appointment? Yes  Can we respond through MyChart? Yes  Agent: Please be advised that Rx refills may take up to 3 business days. We ask that you follow-up with your pharmacy.

## 2023-10-30 MED ORDER — AMLODIPINE BESYLATE 5 MG PO TABS: 5.0000 mg | ORAL_TABLET | Freq: Every day | ORAL | 1 refills | Status: AC

## 2023-10-30 MED ORDER — BUPROPION HCL ER (XL) 300 MG PO TB24: 300.0000 mg | ORAL_TABLET | Freq: Every day | ORAL | 1 refills | Status: DC

## 2023-10-30 NOTE — Telephone Encounter (Signed)
 Requested Prescriptions  Pending Prescriptions Disp Refills   amLODipine (NORVASC) 5 MG tablet 90 tablet 1    Sig: Take 1 tablet (5 mg total) by mouth daily.     Cardiovascular: Calcium Channel Blockers 2 Failed - 10/30/2023  1:23 PM      Failed - Last BP in normal range    BP Readings from Last 1 Encounters:  10/24/23 (!) 164/80         Passed - Last Heart Rate in normal range    Pulse Readings from Last 1 Encounters:  10/24/23 63         Passed - Valid encounter within last 6 months    Recent Outpatient Visits           6 days ago Hyperlipidemia with target low density lipoprotein (LDL) cholesterol less than 130 mg/dL   [facility] at [facility], Selena Batten, DO   3 months ago Grief   [facility] Primary Care [facility] Medicine at [facility], [place], DO   4 months ago History of iron deficiency   [facility] at [facility], [place], DO   9 months ago History of iron deficiency   [facility] Medicine at [facility], [place], DO   1 year ago Left lower quadrant abdominal pain   [place] at [facility], Selena Batten, DO               buPROPion (WELLBUTRIN XL) 300 MG 24 hr tablet 90 tablet 1    Sig: Take 1 tablet (300 mg total) by mouth daily.     Psychiatry: Antidepressants - bupropion Failed - 10/30/2023  1:23 PM      Failed - Cr in normal range and within 360 days    Creat  Date Value Ref Range Status  09/27/2022 1.01 0.70 - 1.35 mg/dL Final         Failed - AST in normal range and within 360 days    AST  Date Value Ref Range Status  09/27/2022 32 10 - 35 U/L Final         Failed - ALT in normal range and within 360 days    ALT  Date Value Ref Range Status  09/27/2022 22 9 - 46 U/L Final         Failed - Last BP in normal range    BP Readings from Last 1 Encounters:   10/24/23 (!) 164/80         Passed - Completed PHQ-2 or PHQ-9 in the last 360 days      Passed - Valid encounter within last 6 months    Recent Outpatient Visits           6 days ago Hyperlipidemia with target low density lipoprotein (LDL) cholesterol less than 130 mg/dL   [facility] Care & Sports Medicine at [facility], Selena Batten, DO   3 months ago Grief   [facility] Primary Care & Sports Medicine at [facility], [place], DO   4 months ago History of iron deficiency   [facility] & Sports Medicine at [facility], [place], DO   9 months ago History of iron deficiency   [facility] Primary Care & Sports Medicine at [facility], [place], DO   1 year ago Left lower quadrant abdominal pain   [facility] at [facility], [place], [place]

## 2023-11-07 ENCOUNTER — Telehealth: Payer: Self-pay | Admitting: *Deleted

## 2023-11-07 ENCOUNTER — Ambulatory Visit

## 2023-11-07 DIAGNOSIS — E785 Hyperlipidemia, unspecified: Secondary | ICD-10-CM | POA: Diagnosis not present

## 2023-11-07 DIAGNOSIS — Z125 Encounter for screening for malignant neoplasm of prostate: Secondary | ICD-10-CM | POA: Diagnosis not present

## 2023-11-07 DIAGNOSIS — Z0184 Encounter for antibody response examination: Secondary | ICD-10-CM

## 2023-11-07 DIAGNOSIS — Z8639 Personal history of other endocrine, nutritional and metabolic disease: Secondary | ICD-10-CM | POA: Diagnosis not present

## 2023-11-07 DIAGNOSIS — I10 Essential (primary) hypertension: Secondary | ICD-10-CM | POA: Diagnosis not present

## 2023-11-07 NOTE — Telephone Encounter (Signed)
 Called and spoke with pt letting him know the info found out by Dr. Ashley Royalty after speaking to our phlebotomist and he verbalized understanding. Pt said he will come by office either tomorrow 4/11 or Monday 4/14 to  have titers done.  Asked pt about the TB test and he said that the hospital will give him the TB test.

## 2023-11-07 NOTE — Telephone Encounter (Signed)
 Copied from CRM [id number]. Topic: General - Other >> Nov 07, 2023 12:18 PM Everette Rank wrote: Reason for CRM: Patient did labs today 04/10 but due to Patient trying to volunteer at the hosptial. They are requesting him to get a titer test/Tdap done. He is asking if the lab done today can be tested on this or does he have to put in other orders and do labs for this again. Need call back [phone]

## 2023-11-07 NOTE — Telephone Encounter (Signed)
 Dr. Ashley Royalty, please advise on the phone call from pt what you recommend.

## 2023-11-07 NOTE — Telephone Encounter (Signed)
 Called and spoke with pt about information from Dr. Ashley Royalty. Pt said the titers he was actually needing done are titer for MMR, Hep B, and Varicella.  Pt said he would go get Tdap vaccine done.   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  Pt said he just had labs drawn today 4/10 and wants to know if these titers might be able to be added on with the labs that were done today or if he might need to be restuck.  Routing this info to Dr. Ashley Royalty.

## 2023-11-08 ENCOUNTER — Encounter: Payer: Self-pay | Admitting: Family Medicine

## 2023-11-08 ENCOUNTER — Other Ambulatory Visit: Payer: Self-pay | Admitting: Family Medicine

## 2023-11-08 DIAGNOSIS — R972 Elevated prostate specific antigen [PSA]: Secondary | ICD-10-CM

## 2023-11-08 LAB — CBC WITH DIFFERENTIAL/PLATELET
Basophils Absolute: 0 10*3/uL (ref 0.0–0.2)
Basos: 1 %
EOS (ABSOLUTE): 0.2 10*3/uL (ref 0.0–0.4)
Eos: 4 %
Hematocrit: 44.6 % (ref 37.5–51.0)
Hemoglobin: 15 g/dL (ref 13.0–17.7)
Immature Grans (Abs): 0 10*3/uL (ref 0.0–0.1)
Immature Granulocytes: 0 %
Lymphocytes Absolute: 1.1 10*3/uL (ref 0.7–3.1)
Lymphs: 20 %
MCH: 33.5 pg — ABNORMAL HIGH (ref 26.6–33.0)
MCHC: 33.6 g/dL (ref 31.5–35.7)
MCV: 100 fL — ABNORMAL HIGH (ref 79–97)
Monocytes Absolute: 0.8 10*3/uL (ref 0.1–0.9)
Monocytes: 15 %
Neutrophils Absolute: 3.3 10*3/uL (ref 1.4–7.0)
Neutrophils: 60 %
Platelets: 242 10*3/uL (ref 150–450)
RBC: 4.48 x10E6/uL (ref 4.14–5.80)
RDW: 12.9 % (ref 11.6–15.4)
WBC: 5.5 10*3/uL (ref 3.4–10.8)

## 2023-11-08 LAB — IRON,TIBC AND FERRITIN PANEL
Ferritin: 36 ng/mL (ref 30–400)
Iron Saturation: 18 % (ref 15–55)
Iron: 60 ug/dL (ref 38–169)
Total Iron Binding Capacity: 326 ug/dL (ref 250–450)
UIBC: 266 ug/dL (ref 111–343)

## 2023-11-08 LAB — CMP14+EGFR
ALT: 31 IU/L (ref 0–44)
AST: 33 IU/L (ref 0–40)
Albumin: 4.1 g/dL (ref 3.9–4.9)
Alkaline Phosphatase: 85 IU/L (ref 44–121)
BUN/Creatinine Ratio: 13 (ref 10–24)
BUN: 14 mg/dL (ref 8–27)
Bilirubin Total: 0.3 mg/dL (ref 0.0–1.2)
CO2: 24 mmol/L (ref 20–29)
Calcium: 9.3 mg/dL (ref 8.6–10.2)
Chloride: 103 mmol/L (ref 96–106)
Creatinine, Ser: 1.05 mg/dL (ref 0.76–1.27)
Globulin, Total: 2.6 g/dL (ref 1.5–4.5)
Glucose: 90 mg/dL (ref 70–99)
Potassium: 4.4 mmol/L (ref 3.5–5.2)
Sodium: 140 mmol/L (ref 134–144)
Total Protein: 6.7 g/dL (ref 6.0–8.5)
eGFR: 76 mL/min/{1.73_m2} (ref >59)

## 2023-11-08 LAB — PSA: Prostate Specific Ag, Serum: 5.4 ng/mL — ABNORMAL HIGH (ref 0.0–4.0)

## 2023-11-08 LAB — LIPID PANEL WITH LDL/HDL RATIO
Cholesterol, Total: 155 mg/dL (ref 100–199)
HDL: 65 mg/dL (ref >39)
LDL Chol Calc (NIH): 76 mg/dL (ref 0–99)
LDL/HDL Ratio: 1.2 ratio (ref 0.0–3.6)
Triglycerides: 73 mg/dL (ref 0–149)
VLDL Cholesterol Cal: 14 mg/dL (ref 5–40)

## 2023-11-12 DIAGNOSIS — Z0184 Encounter for antibody response examination: Secondary | ICD-10-CM | POA: Diagnosis not present

## 2023-11-13 ENCOUNTER — Encounter: Payer: Self-pay | Admitting: Family Medicine

## 2023-11-13 ENCOUNTER — Other Ambulatory Visit: Payer: Self-pay | Admitting: Family Medicine

## 2023-11-13 DIAGNOSIS — R972 Elevated prostate specific antigen [PSA]: Secondary | ICD-10-CM

## 2023-11-13 LAB — MEASLES/MUMPS/RUBELLA IMMUNITY
MUMPS ABS, IGG: 188 [AU]/ml (ref >10.9)
RUBEOLA AB, IGG: >300 [AU]/ml (ref >16.4)
Rubella Antibodies, IGG: 28.9 {index} (ref >0.99)

## 2023-11-13 LAB — PSA, TOTAL AND FREE
PSA, Free Pct: 14.1 %
PSA, Free: 0.99 ng/mL
Prostate Specific Ag, Serum: 7 ng/mL — ABNORMAL HIGH (ref 0.0–4.0)

## 2023-11-13 LAB — HEPATITIS B SURFACE ANTIBODY,QUALITATIVE: Hep B Surface Ab, Qual: NONREACTIVE

## 2023-11-13 LAB — VARICELLA ZOSTER ANTIBODY, IGG: Varicella zoster IgG: REACTIVE

## 2023-11-19 ENCOUNTER — Ambulatory Visit (INDEPENDENT_AMBULATORY_CARE_PROVIDER_SITE_OTHER): Admitting: Family Medicine

## 2023-11-19 VITALS — BP 130/78 | HR 76 | Temp 99.0°F | Ht 71.0 in | Wt 207.0 lb

## 2023-11-19 DIAGNOSIS — I10 Essential (primary) hypertension: Secondary | ICD-10-CM | POA: Diagnosis not present

## 2023-11-19 NOTE — Progress Notes (Signed)
 Patient is here for blood pressure check. Denies trouble sleeping, palpitations, dizziness, lightheadedness, blurry vision, chest pain, shortness of breath, headaches and/or medication problems.   Patient's blood pressure was out of goal range - 147/81, pulse 78. Patient sat for 15 minutes. Blood pressure recheck was at goal 130/78, pulse 76. Provider notified of current blood pressure reading. Per provider, patient is to continue with his current medications.   Patient informed to schedule next appointment as directed by the provider.

## 2023-11-19 NOTE — Progress Notes (Signed)
 Medical screening examination/treatment was performed by qualified clinical staff member and as supervising physician I was immediately available for consultation/collaboration. I have reviewed documentation and agree with assessment and plan.  Everrett Coombe, DO

## 2023-11-20 ENCOUNTER — Telehealth: Payer: Self-pay | Admitting: Family Medicine

## 2023-11-20 NOTE — Telephone Encounter (Signed)
 Copied from CRM [id number]. Topic: Referral - Question >> Nov 20, 2023 10:03 AM Wynona Hedger wrote: Reason for CRM: Please contact patient with phone number of urology place he was referred to

## 2023-11-21 NOTE — Telephone Encounter (Signed)
 Patient has an appointment scheduled for 12/16/2023 at 10:00 am with Dr. Agustina [facility].

## 2023-12-09 ENCOUNTER — Ambulatory Visit: Admitting: Family Medicine

## 2023-12-10 ENCOUNTER — Ambulatory Visit (INDEPENDENT_AMBULATORY_CARE_PROVIDER_SITE_OTHER): Admitting: Family Medicine

## 2023-12-10 ENCOUNTER — Encounter: Payer: Self-pay | Admitting: Family Medicine

## 2023-12-10 VITALS — BP 135/78 | HR 66 | Ht 71.0 in | Wt 207.0 lb

## 2023-12-10 DIAGNOSIS — R972 Elevated prostate specific antigen [PSA]: Secondary | ICD-10-CM | POA: Diagnosis not present

## 2023-12-10 NOTE — Progress Notes (Signed)
 Fred Bond - 73 y.o. male MRN [id number]  Date of birth: [birth year]  Subjective Chief Complaint  Patient presents with   Results    HPI Fred Bond is a 73 y.o. male here today to review recent labs.  He had labs which showed elevated PSA, initially at 5.4%.  Repeat PSA was 7.0% with free PSA 14.1%.  He denies any symptoms including urinary frequency, urgency, significant nocturia or difficulty starting stream.  He does have an appoint with urology already.  ROS:  A comprehensive ROS was completed and negative except as noted per HPI  No Known Allergies  Past Medical History:  Diagnosis Date   Asthma 08/07/2012   Childhood    Depression    Headache    History of iron deficiency 08/07/2012   Hypercholesterolemia    Hypertensive retinopathy of both eyes 03/20/2017   Seen on eye exam August 2018   Mononucleosis    at age 60   OA (osteoarthritis) of knee    left   Recurrent pneumonia 08/07/2012   Tear of meniscus of left knee 08/07/2012    Past Surgical History:  Procedure Laterality Date   COLONOSCOPY W/ BIOPSIES AND POLYPECTOMY     EYE SURGERY     KNEE ARTHROSCOPY     left 2014   TONSILLECTOMY     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   Procedure: TOTAL KNEE ARTHROPLASTY;  Surgeon: Wendolyn Hamburger, MD;  Location: [facility] OR;  Service: Orthopedics;  Laterality: Left;   WISDOM TOOTH EXTRACTION      Social History   Socioeconomic History   Marital status: Married    Spouse name: Not on file   Number of children: Not on file   Years of education: Not on file   Highest education level: Not on file  Occupational History   Not on file  Tobacco Use   Smoking status: Never   Smokeless tobacco: Never  Vaping Use   Vaping status: Never Used  Substance and Sexual Activity   Alcohol use: No   Drug use: No   Sexual activity: Not on file  Other Topics Concern   Not on file  Social History Narrative   Not on file   Social  Drivers of Health   Financial Resource Strain: Low Risk  (04/17/2021)   Overall Financial Resource Strain (CARDIA)    Difficulty of Paying Living Expenses: Not hard at all  Food Insecurity: No Food Insecurity (04/17/2021)   Hunger Vital Sign    Worried About Running Out of Food in the Last Year: Never true    Ran Out of Food in the Last Year: Never true  Transportation Needs: No Transportation Needs (04/17/2021)   PRAPARE - Administrator, Civil Service (Medical): No    Lack of Transportation (Non-Medical): No  Physical Activity: Sufficiently Active (04/17/2021)   Exercise Vital Sign    Days of Exercise per Week: 5 days    Minutes of Exercise per Session: 30 min  Stress: No Stress Concern Present (04/17/2021)   [name] of Occupational Health - Occupational Stress Questionnaire    Feeling of Stress : Not at all  Social Connections: Moderately Integrated (04/17/2021)   Social Connection and Isolation Panel [NHANES]    Frequency of Communication with Friends and Family: More than three times a week    Frequency of Social Gatherings with Friends and Family: More than three times a week    Attends Religious Services:  1 to 4 times per year    Active Member of Clubs or Organizations: No    Attends Banker Meetings: Never    Marital Status: Married    Family History  Problem Relation Age of Onset   Alcohol abuse Unknown        grandfather   Heart attack Unknown        grandparents   Cancer Mother    Melanoma Mother    Melanoma Father     Health Maintenance  Topic Date Due   Medicare Annual Wellness (AWV)  04/17/2022   COVID-19 Vaccine (4 - 2024-25 season) 03/31/2023   Zoster Vaccines- Shingrix (1 of 2) 12/24/2023 (Originally 12/26/1969)   INFLUENZA VACCINE  02/28/2024   Colonoscopy  06/29/2025   DTaP/Tdap/Td (3 - Td or Tdap) 11/11/2033   Pneumonia Vaccine 7+ Years old  Completed   Hepatitis C Screening  Completed   HPV VACCINES  Aged Out    Meningococcal B Vaccine  Aged Out     ----------------------------------------------------------------------------------------------------------------------------------------------------------------------------------------------------------------- Physical Exam BP 135/78   Pulse 66   Ht 5\' 11"  (1.803 m)   Wt 207 lb (93.9 kg)   SpO2 96%   BMI 28.87 kg/m   Physical Exam Constitutional:      Appearance: Normal appearance.  Eyes:     General: No scleral icterus. Neurological:     Mental Status: He is alert.  Psychiatric:        Mood and Affect: Mood normal.        Behavior: Behavior normal.     ------------------------------------------------------------------------------------------------------------------------------------------------------------------------------------------------------------------- Assessment and Plan  Elevated PSA He already has an appointment with urology..  DRE deferred as this will likely be done at urology office as well.  We discussed that he may need to have biopsy completed as well.   No orders of the defined types were placed in this encounter.   No follow-ups on file.

## 2023-12-10 NOTE — Assessment & Plan Note (Signed)
 He already has an appointment with urology..  DRE deferred as this will likely be done at urology office as well.  We discussed that he may need to have biopsy completed as well.

## 2023-12-11 NOTE — Progress Notes (Signed)
 Chief Complaint: No chief complaint on file.   History of Present Illness:  Fred Bond is a 73 y.o. male who is seen in consultation from Adela Holter, DO for evaluation of elevated PSA.  He does have significant old PSA data.  Results are as follows:  April/2018--1.9 October/2019--1.5 November/2020--1.4 January/2022--1.53 February/2024--2.88 11/07/2023--5.4 11/12/2023--7.0 (14% free)  He has not had any prior urologic issues.  No significant voiding issues.  IPSS 3/2.  He is concerned, emotional, as his wife died from metastatic breast cancer 6 months ago.  There is no family history of prostate cancer.   Past Medical History:  Past Medical History:  Diagnosis Date   Asthma 08/07/2012   Childhood    Depression    Headache    History of iron deficiency 08/07/2012   Hypercholesterolemia    Hypertensive retinopathy of both eyes 03/20/2017   Seen on eye exam August 2018   Mononucleosis    at age 71   OA (osteoarthritis) of knee    left   Recurrent pneumonia 08/07/2012   Tear of meniscus of left knee 08/07/2012    Past Surgical History:  Past Surgical History:  Procedure Laterality Date   COLONOSCOPY W/ BIOPSIES AND POLYPECTOMY     EYE SURGERY     KNEE ARTHROSCOPY     left 2014   TONSILLECTOMY     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   Procedure: TOTAL KNEE ARTHROPLASTY;  Surgeon: Wendolyn Hamburger, MD;  Location: [facility] OR;  Service: Orthopedics;  Laterality: Left;   WISDOM TOOTH EXTRACTION      Allergies:  No Known Allergies  Family History:  Family History  Problem Relation Age of Onset   Alcohol abuse Unknown        grandfather   Heart attack Unknown        grandparents   Cancer Mother    Melanoma Mother    Melanoma Father     Social History:  Social History   Tobacco Use   Smoking status: Never   Smokeless tobacco: Never  Vaping Use   Vaping status: Never Used  Substance Use Topics   Alcohol use: No    Drug use: No    Review of symptoms:  Constitutional:  Negative for unexplained weight loss, night sweats, fever, chills ENT:  Negative for nose bleeds, sinus pain, painful swallowing CV:  Negative for chest pain, shortness of breath, exercise intolerance, palpitations, loss of consciousness Resp:  Negative for cough, wheezing, shortness of breath GI:  Negative for nausea, vomiting, diarrhea, bloody stools GU:  Positives noted in HPI; otherwise negative for gross hematuria, dysuria, urinary incontinence Neuro:  Negative for seizures, poor balance, limb weakness, slurred speech Psych:  Negative for lack of energy, depression, anxiety Endocrine:  Negative for polydipsia, polyuria, symptoms of hypoglycemia (dizziness, hunger, sweating) Hematologic:  Negative for anemia, purpura, petechia, prolonged or excessive bleeding, use of anticoagulants  Allergic:  Negative for difficulty breathing or choking as a result of exposure to anything; no shellfish allergy; no allergic response (rash/itch) to materials, foods  Physical exam: There were no vitals taken for this visit. GENERAL APPEARANCE:  Well appearing, well developed, well nourished, NAD HEENT: Atraumatic, Normocephalic. NECK: Normal appearance LUNGS: Normal inspiratory and expiratory excursion HEART: Regular Rate ABDOMEN: No inguinal hernias. GU: Phallus normal, no lesions. Scrotal skin normal. Testicles/epididymal structures normal. Meatus normal. Normal anal sphincter tone, prostate 30 mL, symmetric, non nodular, non tender. EXTREMITIES: Moves all extremities well.  Without clubbing, cyanosis, or edema. NEUROLOGIC:  Alert and oriented x 3, normal gait, CN II-XII grossly intact.  MENTAL STATUS:  Appropriate. SKIN:  Warm, dry and intact.    Results: No results found for this or any previous visit (from the past 24 hours).  I have reviewed referring/prior physicians notes  I have reviewed prior urinalyses  I have reviewed PSA  results  I have reviewed prior imaging--CT images from 2024 reviewed.  Estimated prostate size was 27 mL   IPSS sheet reviewed  Assessment: Elevated PSA with benign/normal exam   Plan: -Causative factors of PSA elevation discussed with him  -At this point, I will have him come back in a couple of weeks to recheck (this will be 6 weeks after his last PSA)  -Follow-up dependent on PSA results

## 2023-12-16 ENCOUNTER — Encounter: Payer: Self-pay | Admitting: Urology

## 2023-12-16 ENCOUNTER — Ambulatory Visit: Admitting: Urology

## 2023-12-16 VITALS — BP 171/67 | HR 67 | Ht 70.0 in | Wt 200.0 lb

## 2023-12-16 DIAGNOSIS — R972 Elevated prostate specific antigen [PSA]: Secondary | ICD-10-CM | POA: Diagnosis not present

## 2023-12-16 LAB — URINALYSIS, ROUTINE W REFLEX MICROSCOPIC
Bilirubin, UA: NEGATIVE
Glucose, UA: NEGATIVE
Leukocytes,UA: NEGATIVE
Nitrite, UA: NEGATIVE
Protein,UA: NEGATIVE
RBC, UA: NEGATIVE
Specific Gravity, UA: >1.03 — ABNORMAL HIGH (ref 1.005–1.030)
Urobilinogen, Ur: 0.2 mg/dL (ref 0.2–1.0)
pH, UA: 5.5 (ref 5.0–7.5)

## 2023-12-16 LAB — MICROSCOPIC EXAMINATION

## 2023-12-16 LAB — BLADDER SCAN AMB NON-IMAGING: Scan Result: 24

## 2023-12-18 DIAGNOSIS — K080 Exfoliation of teeth due to systemic causes: Secondary | ICD-10-CM | POA: Diagnosis not present

## 2023-12-24 DIAGNOSIS — K080 Exfoliation of teeth due to systemic causes: Secondary | ICD-10-CM | POA: Diagnosis not present

## 2024-01-01 DIAGNOSIS — K080 Exfoliation of teeth due to systemic causes: Secondary | ICD-10-CM | POA: Diagnosis not present

## 2024-01-06 ENCOUNTER — Other Ambulatory Visit

## 2024-01-06 DIAGNOSIS — R972 Elevated prostate specific antigen [PSA]: Secondary | ICD-10-CM | POA: Diagnosis not present

## 2024-01-07 ENCOUNTER — Other Ambulatory Visit: Payer: Self-pay | Admitting: Urology

## 2024-01-07 DIAGNOSIS — R972 Elevated prostate specific antigen [PSA]: Secondary | ICD-10-CM

## 2024-01-07 LAB — PSA: Prostate Specific Ag, Serum: 5.6 ng/mL — ABNORMAL HIGH (ref 0.0–4.0)

## 2024-01-08 ENCOUNTER — Ambulatory Visit: Payer: Self-pay

## 2024-01-23 ENCOUNTER — Other Ambulatory Visit

## 2024-01-23 DIAGNOSIS — R972 Elevated prostate specific antigen [PSA]: Secondary | ICD-10-CM | POA: Diagnosis not present

## 2024-01-28 ENCOUNTER — Encounter: Payer: Self-pay | Admitting: Urology

## 2024-01-29 ENCOUNTER — Other Ambulatory Visit: Admitting: Urology

## 2024-01-29 ENCOUNTER — Other Ambulatory Visit (HOSPITAL_BASED_OUTPATIENT_CLINIC_OR_DEPARTMENT_OTHER)

## 2024-01-29 DIAGNOSIS — K080 Exfoliation of teeth due to systemic causes: Secondary | ICD-10-CM | POA: Diagnosis not present

## 2024-02-05 DIAGNOSIS — K080 Exfoliation of teeth due to systemic causes: Secondary | ICD-10-CM | POA: Diagnosis not present

## 2024-02-26 ENCOUNTER — Other Ambulatory Visit (HOSPITAL_BASED_OUTPATIENT_CLINIC_OR_DEPARTMENT_OTHER)

## 2024-02-26 ENCOUNTER — Other Ambulatory Visit: Admitting: Urology

## 2024-02-27 ENCOUNTER — Ambulatory Visit: Admitting: Family Medicine

## 2024-03-04 ENCOUNTER — Ambulatory Visit (HOSPITAL_BASED_OUTPATIENT_CLINIC_OR_DEPARTMENT_OTHER)
Admission: RE | Admit: 2024-03-04 | Discharge: 2024-03-04 | Disposition: A | Discharge status: Home / Self Care | Source: Ambulatory Visit | Attending: Urology | Admitting: Urology

## 2024-03-04 ENCOUNTER — Ambulatory Visit: Admitting: Urology

## 2024-03-04 ENCOUNTER — Other Ambulatory Visit (HOSPITAL_BASED_OUTPATIENT_CLINIC_OR_DEPARTMENT_OTHER): Payer: Self-pay | Admitting: Urology

## 2024-03-04 ENCOUNTER — Other Ambulatory Visit: Payer: Self-pay | Admitting: Urology

## 2024-03-04 VITALS — BP 166/93 | HR 72 | Ht 70.0 in | Wt 200.0 lb

## 2024-03-04 DIAGNOSIS — R972 Elevated prostate specific antigen [PSA]: Secondary | ICD-10-CM

## 2024-03-04 DIAGNOSIS — C61 Malignant neoplasm of prostate: Secondary | ICD-10-CM | POA: Insufficient documentation

## 2024-03-04 DIAGNOSIS — N4232 Atypical small acinar proliferation of prostate: Secondary | ICD-10-CM | POA: Diagnosis not present

## 2024-03-04 LAB — URINALYSIS, ROUTINE W REFLEX MICROSCOPIC
Bilirubin, UA: NEGATIVE
Glucose, UA: NEGATIVE
Ketones, UA: NEGATIVE
Nitrite, UA: NEGATIVE
Protein,UA: NEGATIVE
Specific Gravity, UA: 1.015 (ref 1.005–1.030)
Urobilinogen, Ur: 0.2 mg/dL (ref 0.2–1.0)
pH, UA: 6.5 (ref 5.0–7.5)

## 2024-03-04 LAB — MICROSCOPIC EXAMINATION

## 2024-03-04 MED ORDER — CEFTRIAXONE SODIUM 500 MG IJ SOLR
1.0000 g | Freq: Once | INTRAMUSCULAR | Status: AC
  Administered 2024-03-04: 1 g via INTRAMUSCULAR

## 2024-03-04 NOTE — Progress Notes (Signed)
 TRANSRECTAL ULTRASOUND AND PROSTATE BIOPSY  Indication:  Elevated PSA  Prophylactic antibiotic administration: Rocephin   All medications that could result in increased bleeding were discontinued within an appropriate period of the time of biopsy.  Risk including bleeding and infection were discussed.  Informed consent was obtained.  The patient was placed in the left lateral decubitus position.  PROCEDURE 1.  TRANSRECTAL ULTRASOUND OF THE PROSTATE  The 7 MHz transrectal probe was used to image the prostate.  Anal stenosis was not noted.  TRUS volume: 17 ml  Hypoechoic areas: None  Hyperechoic areas: None  Central calcifications: not present  Margins:  normal  Seminal Vesicles: normal   PROCEDURE 2:  PROSTATE BIOPSY  A periprostatic block was performed using 1% lidocaine and transrectal ultrasound guidance. Under transrectal ultrasound guidance, and using the Biopty gun, prostate biopsies were obtained systematically from the apex, mid gland, and base bilaterally.  A total of 12 cores were obtained.  Hemostasis was obtained with gentle pressure on the prostate.  The procedures were well-tolerated.  No significant bleeding was noted at the end of the procedure.  The patient was stable for discharge from the office.

## 2024-03-04 NOTE — Progress Notes (Signed)
 IM Injection  Patient is present today for an IM Injection for treatment of infection prevention post prostate biopsy Drug: Ceftriaxone  Dose:1g Location:Left outter buttock Lot: [id number] Exp:09/2025 Patient tolerated well, no complications were noted  Performed by: C. Koleen, LPN

## 2024-03-09 ENCOUNTER — Encounter: Payer: Self-pay | Admitting: Urology

## 2024-03-09 LAB — PROSTATE CORE NEEDLE BIOPSY

## 2024-03-12 ENCOUNTER — Ambulatory Visit: Payer: Self-pay

## 2024-03-16 ENCOUNTER — Ambulatory Visit (INDEPENDENT_AMBULATORY_CARE_PROVIDER_SITE_OTHER): Admitting: Family Medicine

## 2024-03-16 ENCOUNTER — Encounter: Payer: Self-pay | Admitting: Family Medicine

## 2024-03-16 VITALS — BP 129/67 | HR 74 | Ht 70.0 in | Wt 204.0 lb

## 2024-03-16 DIAGNOSIS — E785 Hyperlipidemia, unspecified: Secondary | ICD-10-CM

## 2024-03-16 DIAGNOSIS — F320 Major depressive disorder, single episode, mild: Secondary | ICD-10-CM | POA: Diagnosis not present

## 2024-03-16 NOTE — Assessment & Plan Note (Signed)
 Update lipid panel.

## 2024-03-16 NOTE — Assessment & Plan Note (Signed)
 Recommend leaving bupropion  at current strength due to depression and grief along with cutting anniversary of his wife's death.  Will reassess in the spring regarding trying to taper this.

## 2024-03-16 NOTE — Progress Notes (Signed)
 Fred Bond - 73 y.o. male MRN [id number]  Date of birth: 01/22/1951  Subjective Chief Complaint  Patient presents with   Medical Management of Chronic Issues    HPI Fred Bond is a 73 y.o. male here today for follow-up visit.  Reports he is doing pretty well.  He did have recent prostate biopsy showing Gleason score 6 and 7.  His urologist recommended observation for now.  He denies new symptoms.  He is interested in reducing his pravastatin  strength.  He is working on dietary changes.  He is also interested in possibly reducing his bupropion  strength.  He has continued to do grief counseling but has not really found this to be helpful.  He has an upcoming anniversary of his wife's death as well.  ROS:  A comprehensive ROS was completed and negative except as noted per HPI  No Known Allergies  Past Medical History:  Diagnosis Date   Asthma 08/07/2012   Childhood    Depression    Headache    History of iron deficiency 08/07/2012   Hypercholesterolemia    Hypertensive retinopathy of both eyes 03/20/2017   Seen on eye exam August 2018   Mononucleosis    at age 73   OA (osteoarthritis) of knee    left   Recurrent pneumonia 08/07/2012   Tear of meniscus of left knee 08/07/2012    Past Surgical History:  Procedure Laterality Date   COLONOSCOPY W/ BIOPSIES AND POLYPECTOMY     EYE SURGERY     KNEE ARTHROSCOPY     left 2014   TONSILLECTOMY     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   Procedure: TOTAL KNEE ARTHROPLASTY;  Surgeon: Liam Lerner, MD;  Location: [facility] OR;  Service: Orthopedics;  Laterality: Left;   WISDOM TOOTH EXTRACTION      Social History   Socioeconomic History   Marital status: Married    Spouse name: Not on file   Number of children: Not on file   Years of education: Not on file   Highest education level: Not on file  Occupational History   Not on file  Tobacco Use   Smoking status: Never   Smokeless  tobacco: Never  Vaping Use   Vaping status: Never Used  Substance and Sexual Activity   Alcohol use: No   Drug use: No   Sexual activity: Not on file  Other Topics Concern   Not on file  Social History Narrative   Not on file   Social Drivers of Health   Financial Resource Strain: Low Risk  (04/17/2021)   Overall Financial Resource Strain (CARDIA)    Difficulty of Paying Living Expenses: Not hard at all  Food Insecurity: No Food Insecurity (04/17/2021)   Hunger Vital Sign    Worried About Running Out of Food in the Last Year: Never true    Ran Out of Food in the Last Year: Never true  Transportation Needs: No Transportation Needs (04/17/2021)   PRAPARE - Administrator, Civil Service (Medical): No    Lack of Transportation (Non-Medical): No  Physical Activity: Sufficiently Active (04/17/2021)   Exercise Vital Sign    Days of Exercise per Week: 5 days    Minutes of Exercise per Session: 30 min  Stress: No Stress Concern Present (04/17/2021)   [name] of Occupational Health - Occupational Stress Questionnaire    Feeling of Stress : Not at all  Social Connections: Moderately Integrated (04/17/2021)  Social Connection and Isolation Panel    Frequency of Communication with Friends and Family: More than three times a week    Frequency of Social Gatherings with Friends and Family: More than three times a week    Attends Religious Services: 1 to 4 times per year    Active Member of [employer] or Organizations: No    Attends Banker Meetings: Never    Marital Status: Married    Family History  Problem Relation Age of Onset   Alcohol abuse Unknown        grandfather   Heart attack Unknown        grandparents   Cancer Mother    Melanoma Mother    Melanoma Father     Health Maintenance  Topic Date Due   Zoster Vaccines- Shingrix (1 of 2) Never done   [employer] Wellness (AWV)  04/17/2022   COVID-19 Vaccine (4 - 2024-25 season) 03/31/2023    INFLUENZA VACCINE  02/28/2024   Colonoscopy  06/29/2025   DTaP/Tdap/Td (3 - Td or Tdap) 11/11/2033   Pneumococcal Vaccine: 50+ Years  Completed   Hepatitis C Screening  Completed   HPV VACCINES  Aged Out   Meningococcal B Vaccine  Aged Out     ----------------------------------------------------------------------------------------------------------------------------------------------------------------------------------------------------------------- Physical Exam BP 129/67   Pulse 74   Ht 5' 10 (1.778 m)   Wt 204 lb (92.5 kg)   SpO2 94%   BMI 29.27 kg/m   Physical Exam Constitutional:      Appearance: Normal appearance.  Eyes:     General: No scleral icterus. Cardiovascular:     Rate and Rhythm: Normal rate and regular rhythm.  Pulmonary:     Effort: Pulmonary effort is normal.     Breath sounds: Normal breath sounds.  Neurological:     Mental Status: He is alert.  Psychiatric:        Mood and Affect: Mood normal.        Behavior: Behavior normal.     ------------------------------------------------------------------------------------------------------------------------------------------------------------------------------------------------------------------- Assessment and Plan  Hyperlipidemia with target low density lipoprotein (LDL) cholesterol less than 130 mg/dL Update lipid panel.   Depression, major, single episode, mild (HCC) Recommend leaving bupropion  at current strength due to depression and grief along with cutting anniversary of his wife's death.  Will reassess in the spring regarding trying to taper this.   No orders of the defined types were placed in this encounter.   No follow-ups on file.

## 2024-03-24 NOTE — Progress Notes (Signed)
 History of Present Illness: Fred Bond is here today to discuss his positive prostate biopsy.  8.6.2025: TRUS/BX.  Prostate volume 17 mL.  PSA 5.6, PSAD 0.33. 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 lateral--GS 3+3 pattern and 20% of core Right apex medial-GS 3+4 pattern and 20% of core Atypia seen in left base lateral  Past Medical History:  Diagnosis Date   Asthma 08/07/2012   Childhood    Depression    Headache    History of iron deficiency 08/07/2012   Hypercholesterolemia    Hypertensive retinopathy of both eyes 03/20/2017   Seen on eye exam August 2018   Mononucleosis    at age 89   OA (osteoarthritis) of knee    left   Recurrent pneumonia 08/07/2012   Tear of meniscus of left knee 08/07/2012    Past Surgical History:  Procedure Laterality Date   COLONOSCOPY W/ BIOPSIES AND POLYPECTOMY     EYE SURGERY     KNEE ARTHROSCOPY     left 2014   TONSILLECTOMY     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   Procedure: TOTAL KNEE ARTHROPLASTY;  Surgeon: Liam Lerner, MD;  Location: [facility] OR;  Service: Orthopedics;  Laterality: Left;   WISDOM TOOTH EXTRACTION      Home Medications:  Allergies as of 03/25/2024   No Known Allergies      Medication List        Accurate as of March 24, 2024  3:51 PM. If you have any questions, ask your nurse or doctor.          amLODipine  5 MG tablet Commonly known as: NORVASC  Take 1 tablet (5 mg total) by mouth daily.   Aspirin  Low Dose 81 MG tablet Generic drug: aspirin  EC Take 81 mg by mouth. After surgery only   buPROPion  300 MG 24 hr tablet Commonly known as: WELLBUTRIN  XL Take 1 tablet (300 mg total) by mouth daily.   PRESERVISION AREDS PO Take by mouth.   Centrum Silver Ultra Mens Tabs Take 1 tablet by mouth daily.   clobetasol  cream 0.05 % Commonly known as: TEMOVATE  Apply topically 2 (two) times daily.   ferrous sulfate 324 MG Tbec Take 324 mg by mouth.   L-Lysine 500 MG  Tabs Take 500 mg by mouth daily.   niacin 500 MG tablet Commonly known as: VITAMIN B3 Take 500 mg by mouth daily with breakfast.   pantoprazole  40 MG tablet Commonly known as: PROTONIX  Take 1 tablet (40 mg total) by mouth daily. What changed:  when to take this reasons to take this   pravastatin  40 MG tablet Commonly known as: PRAVACHOL  Take 1 tablet by mouth once daily   Skyrizi Pen 150 MG/ML pen Generic drug: risankizumab-rzaa Inject into the skin.   tadalafil  20 MG tablet Commonly known as: CIALIS  TAKE 1/2 TO 1 (ONE-HALF TO ONE) TABLET BY MOUTH EVERY OTHER DAY AS NEEDED FOR  ERECTILE  DYSFUNCTION        Allergies: No Known Allergies  Family History  Problem Relation Age of Onset   Alcohol abuse Unknown        grandfather   Heart attack Unknown        grandparents   Cancer Mother    Melanoma Mother    Melanoma Father     Social History:  reports that he has never smoked. He has never used smokeless tobacco. He reports that he does not drink alcohol and does not use drugs.  ROS: A complete review of systems was performed.  All systems are negative except for pertinent findings as noted.  I have reviewed prior pt notes  Pathology results reviewed with patient  I have independently reviewed prior imaging--prostate ultrasound volume 17 mL  I have reviewed prior PSA results  Impression/Assessment:  Favorable intermediate risk prostate cancer. Nomogram predictions: OCD-66% Risk of SV/LN involvement-3% each Progression free probability after radical prostatectomy after 5/10 years - 85/75%   Plan:  The patient and his sister-in-law were counseled about the natural history of prostate cancer and the standard treatment options that are available for prostate cancer. It was explained to him how his age and life expectancy, clinical stage, Gleason score, and PSA affect his prognosis, the decision to proceed with additional staging studies, as well as how that  information influences recommended treatment strategies. We discussed the roles for active surveillance, radiation therapy, surgical therapy options for the treatment of prostate cancer as appropriate to his individual cancer situation. We discussed the risks and benefits of these options with regard to their impact on cancer control and also in terms of potential adverse events, complications, and impact on quality of life particularly related to urinary, bowel, and sexual function. The patient was encouraged to ask questions throughout the discussion today and all questions were answered to his stated satisfaction. In addition, the patient was provided with and/or directed to appropriate resources and literature for further education about prostate cancer and treatment options.  I will send a decipher test for genomic characterization.  I will notify him after that, and we will proceed with active surveillance if truly low risk, and perhaps brachytherapy if treatment suggested by that test.  63 minutes spent in total, reviewing pathology, nomogram findings, old records, and in direct face-to-face consultation with the patient and his sister-in-law.

## 2024-03-25 ENCOUNTER — Ambulatory Visit (INDEPENDENT_AMBULATORY_CARE_PROVIDER_SITE_OTHER): Admitting: Urology

## 2024-03-25 DIAGNOSIS — C61 Malignant neoplasm of prostate: Secondary | ICD-10-CM | POA: Diagnosis not present

## 2024-04-02 DIAGNOSIS — H5210 Myopia, unspecified eye: Secondary | ICD-10-CM | POA: Diagnosis not present

## 2024-04-06 ENCOUNTER — Other Ambulatory Visit: Payer: Self-pay | Admitting: Family Medicine

## 2024-04-20 ENCOUNTER — Telehealth: Payer: Self-pay | Admitting: Urology

## 2024-04-20 DIAGNOSIS — C61 Malignant neoplasm of prostate: Secondary | ICD-10-CM | POA: Diagnosis not present

## 2024-04-20 NOTE — Telephone Encounter (Signed)
 Patient called and left a voicemail asking if we had received the results from his Decipher test. I let him know that I would look into it and give him a call back.  Rosaline

## 2024-04-22 DIAGNOSIS — L400 Psoriasis vulgaris: Secondary | ICD-10-CM | POA: Diagnosis not present

## 2024-04-23 NOTE — Telephone Encounter (Signed)
 Patient called again for the same reason as previous message. Please Advise.

## 2024-04-24 ENCOUNTER — Other Ambulatory Visit: Payer: Self-pay | Admitting: Family Medicine

## 2024-04-24 DIAGNOSIS — E785 Hyperlipidemia, unspecified: Secondary | ICD-10-CM | POA: Diagnosis not present

## 2024-04-25 LAB — LIPID PANEL WITH LDL/HDL RATIO
Cholesterol, Total: 158 mg/dL (ref 100–199)
HDL: 67 mg/dL (ref >39)
LDL Chol Calc (NIH): 75 mg/dL (ref 0–99)
LDL/HDL Ratio: 1.1 ratio (ref 0.0–3.6)
Triglycerides: 83 mg/dL (ref 0–149)
VLDL Cholesterol Cal: 16 mg/dL (ref 5–40)

## 2024-04-27 ENCOUNTER — Ambulatory Visit: Admitting: Family Medicine

## 2024-04-28 ENCOUNTER — Ambulatory Visit: Admitting: Family Medicine

## 2024-05-01 ENCOUNTER — Other Ambulatory Visit: Payer: Self-pay

## 2024-05-01 ENCOUNTER — Ambulatory Visit (INDEPENDENT_AMBULATORY_CARE_PROVIDER_SITE_OTHER): Admitting: Family Medicine

## 2024-05-01 ENCOUNTER — Encounter: Payer: Self-pay | Admitting: Family Medicine

## 2024-05-01 VITALS — BP 144/73 | HR 63 | Ht 70.0 in | Wt 204.0 lb

## 2024-05-01 DIAGNOSIS — E785 Hyperlipidemia, unspecified: Secondary | ICD-10-CM

## 2024-05-01 DIAGNOSIS — F4321 Adjustment disorder with depressed mood: Secondary | ICD-10-CM | POA: Diagnosis not present

## 2024-05-01 DIAGNOSIS — F320 Major depressive disorder, single episode, mild: Secondary | ICD-10-CM

## 2024-05-01 DIAGNOSIS — I10 Essential (primary) hypertension: Secondary | ICD-10-CM

## 2024-05-01 DIAGNOSIS — C61 Malignant neoplasm of prostate: Secondary | ICD-10-CM

## 2024-05-01 MED ORDER — BUPROPION HCL ER (XL) 150 MG PO TB24: 150.0000 mg | ORAL_TABLET | Freq: Every day | ORAL | 1 refills | Status: AC

## 2024-05-01 MED ORDER — ESCITALOPRAM OXALATE 10 MG PO TABS: ORAL_TABLET | ORAL | 1 refills | Status: AC

## 2024-05-01 NOTE — Assessment & Plan Note (Signed)
 Lab Results  Component Value Date   LDLCALC 75 04/24/2024  Continue pravastatin  at current strength.

## 2024-05-01 NOTE — Assessment & Plan Note (Signed)
 BP is fairly well controlled.  Will plan to continue amlodipine  at current strength.

## 2024-05-01 NOTE — Assessment & Plan Note (Signed)
 Adding lexapro  back on.  Will reduce bupropion  to 150mg .  He will continue with counseling.

## 2024-05-01 NOTE — Progress Notes (Signed)
 Fred Bond - 73 y.o. male MRN [id number]  Date of birth: August 02, 1950  Subjective Chief Complaint  Patient presents with   Hypertension    HPI Fred Bond is a 73 y.o. male here today for follow up visit.   He reports that he is doing pretty well.   He remains on amlodipine  5mg  daily for management of HTN.  BP is elevated on initial check.  He denies side effects from amlodipine  at current strength.  He has not had chest pain, shortness of breath, palpitations, headache or vision changes.   Doesn't feel that bupropion  is quite as effective.  He has taken lexapro  in the past and would like to try adding this back on  Tolerating pravastatin  well.   He continues to undergo work up for prostate cancer.    ROS:  A comprehensive ROS was completed and negative except as noted per HPI  No Known Allergies  Past Medical History:  Diagnosis Date   Asthma 08/07/2012   Childhood    Depression    Headache    History of iron deficiency 08/07/2012   Hypercholesterolemia    Hypertensive retinopathy of both eyes 03/20/2017   Seen on eye exam August 2018   Mononucleosis    at age 40   OA (osteoarthritis) of knee    left   Recurrent pneumonia 08/07/2012   Tear of meniscus of left knee 08/07/2012    Past Surgical History:  Procedure Laterality Date   COLONOSCOPY W/ BIOPSIES AND POLYPECTOMY     EYE SURGERY     KNEE ARTHROSCOPY     left 2014   TONSILLECTOMY     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   Procedure: TOTAL KNEE ARTHROPLASTY;  Surgeon: Liam Lerner, MD;  Location: [facility] OR;  Service: Orthopedics;  Laterality: Left;   WISDOM TOOTH EXTRACTION      Social History   Socioeconomic History   Marital status: Married    Spouse name: Not on file   Number of children: Not on file   Years of education: Not on file   Highest education level: Not on file  Occupational History   Not on file  Tobacco Use   Smoking status: Never    Smokeless tobacco: Never  Vaping Use   Vaping status: Never Used  Substance and Sexual Activity   Alcohol use: No   Drug use: No   Sexual activity: Not on file  Other Topics Concern   Not on file  Social History Narrative   Not on file   Social Drivers of Health   Financial Resource Strain: Low Risk  (04/17/2021)   Overall Financial Resource Strain (CARDIA)    Difficulty of Paying Living Expenses: Not hard at all  Food Insecurity: No Food Insecurity (04/17/2021)   Hunger Vital Sign    Worried About Running Out of Food in the Last Year: Never true    Ran Out of Food in the Last Year: Never true  Transportation Needs: No Transportation Needs (04/17/2021)   PRAPARE - Administrator, Civil Service (Medical): No    Lack of Transportation (Non-Medical): No  Physical Activity: Sufficiently Active (04/17/2021)   Exercise Vital Sign    Days of Exercise per Week: 5 days    Minutes of Exercise per Session: 30 min  Stress: No Stress Concern Present (04/17/2021)   [name] of Occupational Health - Occupational Stress Questionnaire    Feeling of Stress : Not at  all  Social Connections: Moderately Integrated (04/17/2021)   Social Connection and Isolation Panel    Frequency of Communication with Friends and Family: More than three times a week    Frequency of Social Gatherings with Friends and Family: More than three times a week    Attends Religious Services: 1 to 4 times per year    Active Member of [employer] or Organizations: No    Attends Banker Meetings: Never    Marital Status: Married    Family History  Problem Relation Age of Onset   Alcohol abuse Unknown        grandfather   Heart attack Unknown        grandparents   Cancer Mother    Melanoma Mother    Melanoma Father     Health Maintenance  Topic Date Due   Zoster Vaccines- Shingrix (1 of 2) Never done   [employer] Annual Wellness (AWV)  04/17/2022   COVID-19 Vaccine (4 - 2025-26 season)  03/30/2024   Influenza Vaccine  10/27/2024 (Originally 02/28/2024)   Colonoscopy  06/29/2025   DTaP/Tdap/Td (3 - Td or Tdap) 11/11/2033   Pneumococcal Vaccine: 50+ Years  Completed   Hepatitis C Screening  Completed   HPV VACCINES  Aged Out   Meningococcal B Vaccine  Aged Out     ----------------------------------------------------------------------------------------------------------------------------------------------------------------------------------------------------------------- Physical Exam BP (!) 144/73   Pulse 63   Ht 5' 10 (1.778 m)   Wt 204 lb (92.5 kg)   SpO2 97%   BMI 29.27 kg/m   Physical Exam Constitutional:      Appearance: Normal appearance.  HENT:     Head: Normocephalic and atraumatic.  Cardiovascular:     Rate and Rhythm: Normal rate.  Pulmonary:     Effort: Pulmonary effort is normal.     Breath sounds: Normal breath sounds.  Neurological:     Mental Status: He is alert.  Psychiatric:        Mood and Affect: Mood normal.        Behavior: Behavior normal.     ------------------------------------------------------------------------------------------------------------------------------------------------------------------------------------------------------------------- Assessment and Plan  Essential hypertension BP is fairly well controlled.  Will plan to continue amlodipine  at current strength.    Depression, major, single episode, mild Adding lexapro  back on.  Will reduce bupropion  to 150mg .  He will continue with counseling.   Hyperlipidemia with target low density lipoprotein (LDL) cholesterol less than 130 mg/dL Lab Results  Component Value Date   LDLCALC 75 04/24/2024  Continue pravastatin  at current strength.    Meds ordered this encounter  Medications   buPROPion  (WELLBUTRIN  XL) 150 MG 24 hr tablet    Sig: Take 1 tablet (150 mg total) by mouth daily.    Dispense:  90 tablet    Refill:  1   escitalopram  (LEXAPRO ) 10 MG tablet     Sig: Take 1/2 tab po daily x7 days then increase to a full tab    Dispense:  90 tablet    Refill:  1    Return in about 8 weeks (around 06/26/2024) for Mood/BH.

## 2024-05-04 ENCOUNTER — Ambulatory Visit: Payer: Self-pay | Admitting: Family Medicine

## 2024-05-25 DIAGNOSIS — K080 Exfoliation of teeth due to systemic causes: Secondary | ICD-10-CM | POA: Diagnosis not present

## 2024-06-23 ENCOUNTER — Ambulatory Visit: Admitting: Family Medicine

## 2024-06-24 ENCOUNTER — Ambulatory Visit (INDEPENDENT_AMBULATORY_CARE_PROVIDER_SITE_OTHER): Admitting: Family Medicine

## 2024-06-24 ENCOUNTER — Encounter: Payer: Self-pay | Admitting: Family Medicine

## 2024-06-24 VITALS — BP 157/76 | HR 69 | Ht 70.0 in | Wt 208.0 lb

## 2024-06-24 DIAGNOSIS — I10 Essential (primary) hypertension: Secondary | ICD-10-CM

## 2024-06-24 DIAGNOSIS — N529 Male erectile dysfunction, unspecified: Secondary | ICD-10-CM | POA: Diagnosis not present

## 2024-06-24 DIAGNOSIS — F320 Major depressive disorder, single episode, mild: Secondary | ICD-10-CM | POA: Diagnosis not present

## 2024-06-24 MED ORDER — TADALAFIL 20 MG PO TABS: ORAL_TABLET | ORAL | 11 refills | Status: AC

## 2024-06-24 NOTE — Progress Notes (Signed)
 Fred Bond - 73 y.o. male MRN [id number]  Date of birth: 1951/02/12  Subjective Chief Complaint  Patient presents with   Mood    HPI Fred Bond is a 73 y.o. male here today for follow up visit.   He reports that he is doing well with combination of lexapro  and bupropion .  Denies side effects from these at current strength.  He would like to continue these at current strength.   BP remains elevated with amlodipine  at current strength.  He has not had chest pain, shortness of breath, palpitations, headache or vision changes.   Requests renewal of tadalafil .  Has new partner.    ROS:  A comprehensive ROS was completed and negative except as noted per HPI  No Known Allergies  Past Medical History:  Diagnosis Date   Asthma 08/07/2012   Childhood    Depression    Headache    History of iron deficiency 08/07/2012   Hypercholesterolemia    Hypertensive retinopathy of both eyes 03/20/2017   Seen on eye exam August 2018   Mononucleosis    at age 32   OA (osteoarthritis) of knee    left   Recurrent pneumonia 08/07/2012   Tear of meniscus of left knee 08/07/2012    Past Surgical History:  Procedure Laterality Date   COLONOSCOPY W/ BIOPSIES AND POLYPECTOMY     EYE SURGERY     KNEE ARTHROSCOPY     left 2014   TONSILLECTOMY     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   Procedure: TOTAL KNEE ARTHROPLASTY;  Surgeon: Liam Lerner, MD;  Location: [facility] OR;  Service: Orthopedics;  Laterality: Left;   WISDOM TOOTH EXTRACTION      Social History   Socioeconomic History   Marital status: Married    Spouse name: Not on file   Number of children: Not on file   Years of education: Not on file   Highest education level: Not on file  Occupational History   Not on file  Tobacco Use   Smoking status: Never   Smokeless tobacco: Never  Vaping Use   Vaping status: Never Used  Substance and Sexual Activity   Alcohol use: No   Drug use: No    Sexual activity: Not on file  Other Topics Concern   Not on file  Social History Narrative   Not on file   Social Drivers of Health   Financial Resource Strain: Low Risk  (04/17/2021)   Overall Financial Resource Strain (CARDIA)    Difficulty of Paying Living Expenses: Not hard at all  Food Insecurity: No Food Insecurity (04/17/2021)   Hunger Vital Sign    Worried About Running Out of Food in the Last Year: Never true    Ran Out of Food in the Last Year: Never true  Transportation Needs: No Transportation Needs (04/17/2021)   PRAPARE - Administrator, Civil Service (Medical): No    Lack of Transportation (Non-Medical): No  Physical Activity: Sufficiently Active (04/17/2021)   Exercise Vital Sign    Days of Exercise per Week: 5 days    Minutes of Exercise per Session: 30 min  Stress: No Stress Concern Present (04/17/2021)   [name] of Occupational Health - Occupational Stress Questionnaire    Feeling of Stress : Not at all  Social Connections: Moderately Integrated (04/17/2021)   Social Connection and Isolation Panel    Frequency of Communication with Friends and Family: More than three  times a week    Frequency of Social Gatherings with Friends and Family: More than three times a week    Attends Religious Services: 1 to 4 times per year    Active Member of [employer] or Organizations: No    Attends Banker Meetings: Never    Marital Status: Married    Family History  Problem Relation Age of Onset   Alcohol abuse Unknown        grandfather   Heart attack Unknown        grandparents   Cancer Mother    Melanoma Mother    Melanoma Father     Health Maintenance  Topic Date Due   Zoster Vaccines- Shingrix (1 of 2) Never done   Medicare Annual Wellness (AWV)  04/17/2022   COVID-19 Vaccine (4 - 2025-26 season) 03/30/2024   Colonoscopy  06/29/2025   DTaP/Tdap/Td (3 - Td or Tdap) 11/11/2033   Pneumococcal Vaccine: 50+ Years  Completed   Influenza  Vaccine  Completed   Hepatitis C Screening  Completed   Meningococcal B Vaccine  Aged Out     ----------------------------------------------------------------------------------------------------------------------------------------------------------------------------------------------------------------- Physical Exam BP (!) 157/76   Pulse 69   Ht 5' 10 (1.778 m)   Wt 208 lb (94.3 kg)   SpO2 97%   BMI 29.84 kg/m   Physical Exam Constitutional:      Appearance: Normal appearance.  HENT:     Head: Normocephalic and atraumatic.  Eyes:     General: No scleral icterus. Cardiovascular:     Rate and Rhythm: Normal rate and regular rhythm.  Pulmonary:     Effort: Pulmonary effort is normal.     Breath sounds: Normal breath sounds.  Musculoskeletal:     Cervical back: Neck supple.  Neurological:     Mental Status: He is alert.  Psychiatric:        Mood and Affect: Mood normal.        Behavior: Behavior normal.     ------------------------------------------------------------------------------------------------------------------------------------------------------------------------------------------------------------------- Assessment and Plan  Depression, major, single episode, mild Doing well with combination of lexapro  and bupropion .  Continues to see counselor.     Essential hypertension BP elevated. Return in 2 weeks for BP check.   ED (erectile dysfunction) Tadalafil  renewed.    Meds ordered this encounter  Medications   tadalafil  (CIALIS ) 20 MG tablet    Sig: TAKE 1/2 TO 1 (ONE-HALF TO ONE) TABLET BY MOUTH EVERY OTHER DAY AS NEEDED FOR  ERECTILE  DYSFUNCTION    Dispense:  15 tablet    Refill:  11    Return in about 2 weeks (around 07/08/2024) for nurse visit for BP check.

## 2024-06-24 NOTE — Patient Instructions (Signed)
 Try magnesium glycinate 250-500mg  at bedtime.

## 2024-06-24 NOTE — Assessment & Plan Note (Signed)
Tadalafil renewed.  

## 2024-06-24 NOTE — Assessment & Plan Note (Signed)
 Doing well with combination of lexapro  and bupropion .  Continues to see counselor.

## 2024-06-24 NOTE — Assessment & Plan Note (Signed)
 BP elevated. Return in 2 weeks for BP check.

## 2024-07-01 ENCOUNTER — Encounter: Admitting: Family Medicine

## 2024-07-07 ENCOUNTER — Ambulatory Visit

## 2024-07-13 ENCOUNTER — Ambulatory Visit

## 2024-07-14 ENCOUNTER — Ambulatory Visit

## 2024-07-14 VITALS — BP 136/65 | HR 66 | Ht 70.0 in | Wt 209.0 lb

## 2024-07-14 DIAGNOSIS — Z0000 Encounter for general adult medical examination without abnormal findings: Secondary | ICD-10-CM | POA: Diagnosis not present

## 2024-07-14 NOTE — Patient Instructions (Signed)
 Mr. Cardozo,  Thank you for taking the time for your Medicare Wellness Visit. I appreciate your continued commitment to your health goals. Please review the care plan we discussed, and feel free to reach out if I can assist you further.  Please note that Annual Wellness Visits do not include a physical exam. Some assessments may be limited, especially if the visit was conducted virtually. If needed, we may recommend an in-person follow-up with your provider.  Ongoing Care Seeing your primary care provider every 3 to 6 months helps us  monitor your health and provide consistent, personalized care.   Referrals If a referral was made during today's visit and you haven't received any updates within two weeks, please contact the referred provider directly to check on the status.  Recommended Screenings:  Health Maintenance  Topic Date Due   Medicare Annual Wellness Visit  04/17/2022   COVID-19 Vaccine (4 - 2025-26 season) 03/30/2024   Colon Cancer Screening  06/29/2025   DTaP/Tdap/Td vaccine (3 - Td or Tdap) 11/11/2033   Pneumococcal Vaccine for age over 52  Completed   Flu Shot  Completed   Hepatitis C Screening  Completed   Zoster (Shingles) Vaccine  Completed   Meningitis B Vaccine  Aged Out   Hepatitis B Vaccine  Discontinued       07/14/2024    3:54 PM  Advanced Directives  Does Patient Have a Medical Advance Directive? Yes  Type of Advance Directive Living will  Does patient want to make changes to medical advance directive? No - Patient declined    Vision: Annual vision screenings are recommended for early detection of glaucoma, cataracts, and diabetic retinopathy. These exams can also reveal signs of chronic conditions such as diabetes and high blood pressure.  Dental: Annual dental screenings help detect early signs of oral cancer, gum disease, and other conditions linked to overall health, including heart disease and diabetes.  Please see the attached documents for  additional preventive care recommendations.

## 2024-07-14 NOTE — Progress Notes (Signed)
 Chief Complaint  Patient presents with   Medicare Wellness     Subjective:   Fred Bond is a 73 y.o. male who presents for a Medicare Annual Wellness Visit.  Visit info / Clinical Intake: Medicare Wellness Visit Type:: Subsequent Annual Wellness Visit Persons participating in visit and providing information:: patient Medicare Wellness Visit Mode:: In-person (required for WTM) Interpreter Needed?: No Pre-visit prep was completed: yes AWV questionnaire completed by patient prior to visit?: no Living arrangements:: (!) lives alone Patient's Overall Health Status Rating: good Typical amount of pain: none Does pain affect daily life?: no Are you currently prescribed opioids?: no  Dietary Habits and Nutritional Risks How many meals a day?: 2 Eats fruit and vegetables daily?: (!) no Most meals are obtained by: preparing own meals In the last 2 weeks, have you had any of the following?: none Diabetic:: no  Functional Status Activities of Daily Living (to include ambulation/medication): Independent Ambulation: Independent Medication Administration: Independent Home Management (perform basic housework or laundry): Independent Manage your own finances?: yes Primary transportation is: driving Concerns about vision?: no *vision screening is required for WTM* Concerns about hearing?: no  Fall Screening Falls in the past year?: 0 Number of falls in past year: 0 Was there an injury with Fall?: 0 Fall Risk Category Calculator: 0 Patient Fall Risk Level: Low Fall Risk  Fall Risk Patient at Risk for Falls Due to: No Fall Risks Fall risk Follow up: Falls evaluation completed  Home and Transportation Safety: All rugs have non-skid backing?: yes All stairs or steps have railings?: yes Grab bars in the bathtub or shower?: (!) no Have non-skid surface in bathtub or shower?: yes Good home lighting?: yes Regular seat belt use?: yes Hospital stays in the last year::  no  Cognitive Assessment Difficulty concentrating, remembering, or making decisions? : no Will 6CIT or Mini Cog be Completed: yes What year is it?: 0 points What month is it?: 0 points Give patient an address phrase to remember (5 components): [address] [place], [place] [address] About what time is it?: 0 points Count backwards from 20 to 1: 0 points Say the months of the year in reverse: 0 points Repeat the address phrase from earlier: 0 points 6 CIT Score: 0 points  Advance Directives (For Healthcare) Does Patient Have a Medical Advance Directive?: Yes Does patient want to make changes to medical advance directive?: No - Patient declined Type of Advance Directive: Living will  Reviewed/Updated  Reviewed/Updated: Reviewed All (Medical, Surgical, Family, Medications, Allergies, Care Teams, Patient Goals)    Allergies (verified) Patient has no known allergies.   Current Medications (verified) Outpatient Encounter Medications as of 07/14/2024  Medication Sig   amLODipine  (NORVASC ) 5 MG tablet Take 1 tablet (5 mg total) by mouth daily.   buPROPion  (WELLBUTRIN  XL) 150 MG 24 hr tablet Take 1 tablet (150 mg total) by mouth daily.   clobetasol  cream (TEMOVATE ) 0.05 % Apply topically 2 (two) times daily.   escitalopram  (LEXAPRO ) 10 MG tablet Take 1/2 tab po daily x7 days then increase to a full tab   ferrous sulfate 324 MG TBEC Take 324 mg by mouth.   L-Lysine 500 MG TABS Take 500 mg by mouth daily.    Multiple Vitamins-Minerals (CENTRUM SILVER ULTRA MENS) TABS Take 1 tablet by mouth daily.    Multiple Vitamins-Minerals (PRESERVISION AREDS PO) Take by mouth.   niacin 500 MG tablet Take 500 mg by mouth daily with breakfast.   pantoprazole  (PROTONIX ) 40 MG tablet  Take 1 tablet (40 mg total) by mouth as needed.   pravastatin  (PRAVACHOL ) 40 MG tablet Take 1 tablet by mouth once daily   tadalafil  (CIALIS ) 20 MG tablet TAKE 1/2 TO 1 (ONE-HALF TO ONE) TABLET BY MOUTH EVERY OTHER DAY AS  NEEDED FOR  ERECTILE  DYSFUNCTION   ASPIRIN  LOW DOSE 81 MG EC tablet Take 81 mg by mouth. After surgery only (Patient not taking: Reported on 07/14/2024)   SKYRIZI PEN 150 MG/ML pen Inject into the skin. (Patient not taking: Reported on 07/14/2024)   No facility-administered encounter medications on file as of 07/14/2024.    History: Past Medical History:  Diagnosis Date   Asthma 08/07/2012   Childhood    Depression    Headache    History of iron deficiency 08/07/2012   Hypercholesterolemia    Hypertensive retinopathy of both eyes 03/20/2017   Seen on eye exam August 2018   Mononucleosis    at age 39   OA (osteoarthritis) of knee    left   Recurrent pneumonia 08/07/2012   Tear of meniscus of left knee 08/07/2012   Past Surgical History:  Procedure Laterality Date   COLONOSCOPY W/ BIOPSIES AND POLYPECTOMY     EYE SURGERY     KNEE ARTHROSCOPY     left 2014   TONSILLECTOMY     TOTAL KNEE ARTHROPLASTY Right 05/01/2021   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   Procedure: TOTAL KNEE ARTHROPLASTY;  Surgeon: Liam Lerner, MD;  Location: [facility] OR;  Service: Orthopedics;  Laterality: Left;   WISDOM TOOTH EXTRACTION     Family History  Problem Relation Age of Onset   Alcohol abuse Unknown        grandfather   Heart attack Unknown        grandparents   Cancer Mother    Melanoma Mother    Melanoma Father    Social History   Occupational History   Not on file  Tobacco Use   Smoking status: Never   Smokeless tobacco: Never  Vaping Use   Vaping status: Never Used  Substance and Sexual Activity   Alcohol use: No   Drug use: No   Sexual activity: Not on file   Tobacco Counseling Counseling given: Not Answered  SDOH Screenings   Housing: Low Risk (07/14/2024)  Transportation Needs: No Transportation Needs (07/14/2024)  Utilities: Not At Risk (07/14/2024)  Depression (PHQ2-9): Low Risk (07/14/2024)  Recent Concern: Depression (PHQ2-9) - Medium Risk (06/24/2024)  Physical  Activity: Sufficiently Active (07/14/2024)  Social Connections: Moderately Isolated (07/14/2024)  Stress: Stress Concern Present (07/14/2024)  Tobacco Use: Low Risk (07/14/2024)  Health Literacy: Adequate Health Literacy (07/14/2024)   See flowsheets for full screening details  Depression Screen PHQ 2 & 9 Depression Scale- Over the past 2 weeks, how often have you been bothered by any of the following problems? Little interest or pleasure in doing things: 0 Feeling down, depressed, or hopeless (PHQ Adolescent also includes...irritable): 0 PHQ-2 Total Score: 0 Trouble falling or staying asleep, or sleeping too much: 2 Feeling tired or having little energy: 1 Poor appetite or overeating (PHQ Adolescent also includes...weight loss): 0 Feeling bad about yourself - or that you are a failure or have let yourself or your family down: 1 Trouble concentrating on things, such as reading the newspaper or watching television (PHQ Adolescent also includes...like school work): 0 Moving or speaking so slowly that other people could have noticed. Or the opposite - being so fidgety or restless that you have  been moving around a lot more than usual: 0 Thoughts that you would be better off dead, or of hurting yourself in some way: 0 PHQ-9 Total Score: 6 If you checked off any problems, how difficult have these problems made it for you to do your work, take care of things at home, or get along with other people?: Not difficult at all  Depression Treatment Depression Interventions/Treatment : [id number] Score <4 Follow-up Not Indicated     Goals Addressed             This Visit's Progress    Patient Stated       Patient states he would like to get to a healthier weight.              Objective:    Today's Vitals   07/14/24 1536  BP: 136/65  Pulse: 66  SpO2: 94%  Weight: 209 lb (94.8 kg)  Height: 5' 10 (1.778 m)   Body mass index is 29.99 kg/m.  Hearing/Vision screen No results  found. Immunizations and Health Maintenance Health Maintenance  Topic Date Due   COVID-19 Vaccine (4 - 2025-26 season) 03/30/2024   Colonoscopy  06/29/2025   Medicare Annual Wellness (AWV)  07/14/2025   DTaP/Tdap/Td (3 - Td or Tdap) 11/11/2033   Pneumococcal Vaccine: 50+ Years  Completed   Influenza Vaccine  Completed   Hepatitis C Screening  Completed   Zoster Vaccines- Shingrix  Completed   Meningococcal B Vaccine  Aged Out   Hepatitis B Vaccines 19-59 Average Risk  Discontinued        Assessment/Plan:  This is a routine wellness examination for [place].  Patient Care Team: Alvia Bring, DO as PCP - General (Family Medicine) Rubbie Donnice DEL, MD as Referring Physician (Dermatology) Matilda Senior, MD as Consulting Physician (Urology)  I have personally reviewed and noted the following in the patients chart:   Medical and social history Use of alcohol, tobacco or illicit drugs  Current medications and supplements including opioid prescriptions. Functional ability and status Nutritional status Physical activity Advanced directives List of other physicians Hospitalizations, surgeries, and ER visits in previous 12 months Vitals Screenings to include cognitive, depression, and falls Referrals and appointments  No orders of the defined types were placed in this encounter.  In addition, I have reviewed and discussed with patient certain preventive protocols, quality metrics, and best practice recommendations. A written personalized care plan for preventive services as well as general preventive health recommendations were provided to patient.   Bonny Jon Mayor, CMA   07/14/2024   Return in 1 year (on 07/14/2025).  After Visit Summary: (In Person-Printed) AVS printed and given to the patient  Nurse Notes:   Dedric Ethington is a 73 y.o. male patient of Bring Alvia, DO who had a Medicare Annual Wellness Visit today. He reports that he is socially active  and does interact with friends/family regularly. He is moderately physically active.

## 2024-07-20 ENCOUNTER — Encounter: Admitting: Family Medicine

## 2024-07-20 NOTE — Progress Notes (Deleted)
" ° °  Subjective:    Patient ID: Fred Bond, male    DOB: May 06, 1951, 73 y.o.   MRN: [id number]  HPI  Patient is here for a 2 week BP recheck. Last OV it was 157/76. Patient currently taken amlodipine  5 mg. Denies  chest pain, shortness of breath, palpitations, headache or vision changes.   Review of Systems     Objective:   Physical Exam        Assessment & Plan:   Patients first BP reading is second is  "

## 2024-07-21 NOTE — Progress Notes (Signed)
 This encounter was created in error - please disregard.

## 2024-08-14 NOTE — Progress Notes (Signed)
 Fred Bond                                          MRN: [id number]   08/14/2024   The [facility] Quality Team Specialist reviewed this patient medical record for the purposes of chart review for care gap closure. The following were reviewed: chart review for care gap closure-controlling blood pressure.    [facility] Quality Team

## 2024-08-28 ENCOUNTER — Other Ambulatory Visit

## 2024-08-31 ENCOUNTER — Ambulatory Visit: Admitting: Urology

## 2024-09-18 ENCOUNTER — Other Ambulatory Visit

## 2024-09-23 ENCOUNTER — Ambulatory Visit: Admitting: Urology

## 2024-12-24 ENCOUNTER — Ambulatory Visit: Admitting: Family Medicine

## 2025-07-15 ENCOUNTER — Ambulatory Visit
